# Patient Record
Sex: Female | Born: 1955 | Race: White | Hispanic: No | Marital: Married | Smoking: Former smoker
Health system: Southern US, Community
[De-identification: ages and names within clinical notes are randomized; demographics above are authoritative.]

## PROBLEM LIST (undated history)

## (undated) DIAGNOSIS — R112 Nausea with vomiting, unspecified: Secondary | ICD-10-CM

## (undated) DIAGNOSIS — E119 Type 2 diabetes mellitus without complications: Secondary | ICD-10-CM

## (undated) DIAGNOSIS — J309 Allergic rhinitis, unspecified: Secondary | ICD-10-CM

## (undated) DIAGNOSIS — N950 Postmenopausal bleeding: Secondary | ICD-10-CM

## (undated) DIAGNOSIS — Z9889 Other specified postprocedural states: Secondary | ICD-10-CM

## (undated) DIAGNOSIS — C801 Malignant (primary) neoplasm, unspecified: Secondary | ICD-10-CM

## (undated) DIAGNOSIS — G4730 Sleep apnea, unspecified: Secondary | ICD-10-CM

## (undated) DIAGNOSIS — M1990 Unspecified osteoarthritis, unspecified site: Secondary | ICD-10-CM

## (undated) DIAGNOSIS — J4520 Mild intermittent asthma, uncomplicated: Secondary | ICD-10-CM

## (undated) DIAGNOSIS — I10 Essential (primary) hypertension: Secondary | ICD-10-CM

## (undated) DIAGNOSIS — I8390 Asymptomatic varicose veins of unspecified lower extremity: Secondary | ICD-10-CM

## (undated) DIAGNOSIS — G245 Blepharospasm: Secondary | ICD-10-CM

## (undated) DIAGNOSIS — N879 Dysplasia of cervix uteri, unspecified: Secondary | ICD-10-CM

## (undated) DIAGNOSIS — M25559 Pain in unspecified hip: Secondary | ICD-10-CM

## (undated) DIAGNOSIS — Z85820 Personal history of malignant melanoma of skin: Secondary | ICD-10-CM

## (undated) HISTORY — PX: KNEE ARTHROSCOPY: SUR90

## (undated) HISTORY — PX: JOINT REPLACEMENT: SHX530

## (undated) HISTORY — PX: DILATION AND CURETTAGE OF UTERUS: SHX78

## (undated) HISTORY — PX: SHOULDER ARTHROSCOPY W/ ROTATOR CUFF REPAIR: SHX2400

## (undated) HISTORY — PX: HAMMER TOE SURGERY: SHX385

## (undated) HISTORY — PX: ABDOMINAL HYSTERECTOMY: SHX81

## (undated) HISTORY — PX: COLONOSCOPY: SHX174

## (undated) HISTORY — PX: OTHER SURGICAL HISTORY: SHX169

## (undated) HISTORY — PX: TONSILLECTOMY: SUR1361

## (undated) HISTORY — DX: Blepharospasm: G24.5

---

## 1998-11-05 ENCOUNTER — Other Ambulatory Visit: Admission: RE | Admit: 1998-11-05 | Discharge: 1998-11-05 | Payer: Self-pay | Admitting: Obstetrics and Gynecology

## 1999-10-23 ENCOUNTER — Other Ambulatory Visit: Admission: RE | Admit: 1999-10-23 | Discharge: 1999-10-23 | Payer: Self-pay | Admitting: Obstetrics and Gynecology

## 1999-12-31 ENCOUNTER — Encounter: Admission: RE | Admit: 1999-12-31 | Discharge: 1999-12-31 | Payer: Self-pay | Admitting: Obstetrics and Gynecology

## 1999-12-31 ENCOUNTER — Encounter: Payer: Self-pay | Admitting: Obstetrics and Gynecology

## 2000-04-16 ENCOUNTER — Encounter: Payer: Self-pay | Admitting: Orthopedic Surgery

## 2000-04-16 ENCOUNTER — Ambulatory Visit (HOSPITAL_COMMUNITY): Admission: RE | Admit: 2000-04-16 | Discharge: 2000-04-16 | Payer: Self-pay | Admitting: Orthopedic Surgery

## 2000-07-31 ENCOUNTER — Encounter: Payer: Self-pay | Admitting: Internal Medicine

## 2000-07-31 ENCOUNTER — Ambulatory Visit (HOSPITAL_COMMUNITY): Admission: RE | Admit: 2000-07-31 | Discharge: 2000-07-31 | Payer: Self-pay | Admitting: Internal Medicine

## 2000-11-04 ENCOUNTER — Other Ambulatory Visit: Admission: RE | Admit: 2000-11-04 | Discharge: 2000-11-04 | Payer: Self-pay | Admitting: Obstetrics and Gynecology

## 2001-01-06 ENCOUNTER — Encounter: Payer: Self-pay | Admitting: Obstetrics and Gynecology

## 2001-01-06 ENCOUNTER — Encounter: Admission: RE | Admit: 2001-01-06 | Discharge: 2001-01-06 | Payer: Self-pay | Admitting: Obstetrics and Gynecology

## 2001-11-05 ENCOUNTER — Other Ambulatory Visit: Admission: RE | Admit: 2001-11-05 | Discharge: 2001-11-05 | Payer: Self-pay | Admitting: Obstetrics and Gynecology

## 2002-01-10 ENCOUNTER — Encounter: Payer: Self-pay | Admitting: Obstetrics and Gynecology

## 2002-01-10 ENCOUNTER — Encounter: Admission: RE | Admit: 2002-01-10 | Discharge: 2002-01-10 | Payer: Self-pay | Admitting: Obstetrics and Gynecology

## 2002-11-07 ENCOUNTER — Other Ambulatory Visit: Admission: RE | Admit: 2002-11-07 | Discharge: 2002-11-07 | Payer: Self-pay | Admitting: Obstetrics and Gynecology

## 2003-01-09 ENCOUNTER — Encounter: Payer: Self-pay | Admitting: Obstetrics and Gynecology

## 2003-01-09 ENCOUNTER — Encounter: Admission: RE | Admit: 2003-01-09 | Discharge: 2003-01-09 | Payer: Self-pay | Admitting: Obstetrics and Gynecology

## 2003-11-09 ENCOUNTER — Other Ambulatory Visit: Admission: RE | Admit: 2003-11-09 | Discharge: 2003-11-09 | Payer: Self-pay | Admitting: Obstetrics and Gynecology

## 2004-01-15 ENCOUNTER — Encounter: Admission: RE | Admit: 2004-01-15 | Discharge: 2004-01-15 | Payer: Self-pay | Admitting: Obstetrics and Gynecology

## 2004-08-28 ENCOUNTER — Encounter: Admission: RE | Admit: 2004-08-28 | Discharge: 2004-08-28 | Payer: Self-pay | Admitting: Pediatrics

## 2004-09-27 ENCOUNTER — Ambulatory Visit: Payer: Self-pay | Admitting: Internal Medicine

## 2004-10-14 ENCOUNTER — Ambulatory Visit: Payer: Self-pay | Admitting: Internal Medicine

## 2004-10-14 HISTORY — PX: ESOPHAGOGASTRODUODENOSCOPY: SHX1529

## 2004-11-11 ENCOUNTER — Other Ambulatory Visit: Admission: RE | Admit: 2004-11-11 | Discharge: 2004-11-11 | Payer: Self-pay | Admitting: Obstetrics and Gynecology

## 2004-12-11 ENCOUNTER — Ambulatory Visit: Payer: Self-pay | Admitting: Internal Medicine

## 2005-01-03 ENCOUNTER — Ambulatory Visit: Payer: Self-pay | Admitting: Internal Medicine

## 2005-01-13 ENCOUNTER — Encounter: Admission: RE | Admit: 2005-01-13 | Discharge: 2005-01-13 | Payer: Self-pay | Admitting: Obstetrics and Gynecology

## 2005-01-13 ENCOUNTER — Ambulatory Visit: Payer: Self-pay | Admitting: Internal Medicine

## 2005-01-21 ENCOUNTER — Ambulatory Visit: Payer: Self-pay | Admitting: Internal Medicine

## 2005-02-19 ENCOUNTER — Ambulatory Visit: Payer: Self-pay | Admitting: Internal Medicine

## 2005-08-01 ENCOUNTER — Ambulatory Visit (HOSPITAL_COMMUNITY): Admission: RE | Admit: 2005-08-01 | Discharge: 2005-08-02 | Payer: Self-pay | Admitting: Specialist

## 2005-11-11 ENCOUNTER — Other Ambulatory Visit: Admission: RE | Admit: 2005-11-11 | Discharge: 2005-11-11 | Payer: Self-pay | Admitting: Obstetrics and Gynecology

## 2006-01-05 ENCOUNTER — Ambulatory Visit: Payer: Self-pay | Admitting: Internal Medicine

## 2006-01-12 ENCOUNTER — Encounter: Admission: RE | Admit: 2006-01-12 | Discharge: 2006-01-12 | Payer: Self-pay | Admitting: Internal Medicine

## 2006-01-12 ENCOUNTER — Ambulatory Visit: Payer: Self-pay | Admitting: Internal Medicine

## 2006-01-26 ENCOUNTER — Ambulatory Visit: Payer: Self-pay | Admitting: Pulmonary Disease

## 2007-01-14 ENCOUNTER — Ambulatory Visit: Payer: Self-pay | Admitting: Internal Medicine

## 2007-01-14 ENCOUNTER — Encounter: Admission: RE | Admit: 2007-01-14 | Discharge: 2007-01-14 | Payer: Self-pay | Admitting: Obstetrics and Gynecology

## 2007-01-14 LAB — CONVERTED CEMR LAB
ALT: 20 units/L (ref 0–40)
AST: 16 units/L (ref 0–37)
Albumin: 3.8 g/dL (ref 3.5–5.2)
Alkaline Phosphatase: 58 units/L (ref 39–117)
Basophils Absolute: 0 10*3/uL (ref 0.0–0.1)
Basophils Relative: 0.6 % (ref 0.0–1.0)
Calcium: 9.6 mg/dL (ref 8.4–10.5)
Chloride: 107 meq/L (ref 96–112)
Eosinophils Relative: 1.7 % (ref 0.0–5.0)
GFR calc non Af Amer: 81 mL/min
Glucose, Bld: 107 mg/dL — ABNORMAL HIGH (ref 70–99)
HCT: 40.7 % (ref 36.0–46.0)
LDL Cholesterol: 70 mg/dL (ref 0–99)
Neutrophils Relative %: 62.1 % (ref 43.0–77.0)
RBC: 4.59 M/uL (ref 3.87–5.11)
RDW: 12.6 % (ref 11.5–14.6)
Total CHOL/HDL Ratio: 3.1
Total Protein, Urine: NEGATIVE mg/dL
VLDL: 16 mg/dL (ref 0–40)
WBC: 6.8 10*3/uL (ref 4.5–10.5)
pH: 5 (ref 5.0–8.0)

## 2007-01-21 ENCOUNTER — Ambulatory Visit: Payer: Self-pay | Admitting: Internal Medicine

## 2007-09-07 DIAGNOSIS — J45909 Unspecified asthma, uncomplicated: Secondary | ICD-10-CM | POA: Insufficient documentation

## 2007-09-07 DIAGNOSIS — I10 Essential (primary) hypertension: Secondary | ICD-10-CM | POA: Insufficient documentation

## 2007-09-07 DIAGNOSIS — M1990 Unspecified osteoarthritis, unspecified site: Secondary | ICD-10-CM

## 2007-09-07 DIAGNOSIS — G4733 Obstructive sleep apnea (adult) (pediatric): Secondary | ICD-10-CM | POA: Insufficient documentation

## 2007-09-24 ENCOUNTER — Ambulatory Visit: Payer: Self-pay | Admitting: Internal Medicine

## 2008-01-04 ENCOUNTER — Ambulatory Visit: Payer: Self-pay | Admitting: Internal Medicine

## 2008-01-04 LAB — CONVERTED CEMR LAB
ALT: 23 units/L (ref 0–35)
Albumin: 3.8 g/dL (ref 3.5–5.2)
Alkaline Phosphatase: 63 units/L (ref 39–117)
BUN: 17 mg/dL (ref 6–23)
Basophils Absolute: 0 10*3/uL (ref 0.0–0.1)
Basophils Relative: 0.5 % (ref 0.0–1.0)
Bilirubin Urine: NEGATIVE
CO2: 26 meq/L (ref 19–32)
Calcium: 9.7 mg/dL (ref 8.4–10.5)
Creatinine, Ser: 0.7 mg/dL (ref 0.4–1.2)
GFR calc Af Amer: 114 mL/min
LDL Cholesterol: 87 mg/dL (ref 0–99)
MCHC: 32.4 g/dL (ref 30.0–36.0)
Monocytes Relative: 6.8 % (ref 3.0–11.0)
Neutro Abs: 4.6 10*3/uL (ref 1.4–7.7)
Platelets: 232 10*3/uL (ref 150–400)
RDW: 12.3 % (ref 11.5–14.6)
Specific Gravity, Urine: 1.025 (ref 1.000–1.03)
Total CHOL/HDL Ratio: 3.7
Total Protein, Urine: NEGATIVE mg/dL
Total Protein: 6.7 g/dL (ref 6.0–8.3)
Triglycerides: 50 mg/dL (ref 0–149)
Urine Glucose: NEGATIVE mg/dL
VLDL: 10 mg/dL (ref 0–40)
pH: 5.5 (ref 5.0–8.0)

## 2008-01-10 ENCOUNTER — Encounter: Admission: RE | Admit: 2008-01-10 | Discharge: 2008-01-10 | Payer: Self-pay | Admitting: Obstetrics and Gynecology

## 2008-01-11 ENCOUNTER — Ambulatory Visit: Payer: Self-pay | Admitting: Internal Medicine

## 2008-01-11 DIAGNOSIS — J309 Allergic rhinitis, unspecified: Secondary | ICD-10-CM | POA: Insufficient documentation

## 2008-01-11 DIAGNOSIS — E739 Lactose intolerance, unspecified: Secondary | ICD-10-CM

## 2008-04-21 ENCOUNTER — Telehealth: Payer: Self-pay | Admitting: Internal Medicine

## 2008-05-01 ENCOUNTER — Encounter: Payer: Self-pay | Admitting: Internal Medicine

## 2008-05-02 ENCOUNTER — Telehealth: Payer: Self-pay | Admitting: Internal Medicine

## 2008-09-18 ENCOUNTER — Telehealth: Payer: Self-pay | Admitting: Internal Medicine

## 2008-09-22 ENCOUNTER — Ambulatory Visit: Payer: Self-pay | Admitting: Internal Medicine

## 2008-09-28 ENCOUNTER — Ambulatory Visit: Payer: Self-pay | Admitting: Internal Medicine

## 2008-10-05 ENCOUNTER — Ambulatory Visit: Payer: Self-pay | Admitting: Internal Medicine

## 2008-10-26 ENCOUNTER — Encounter: Payer: Self-pay | Admitting: Internal Medicine

## 2008-10-27 ENCOUNTER — Telehealth (INDEPENDENT_AMBULATORY_CARE_PROVIDER_SITE_OTHER): Payer: Self-pay | Admitting: *Deleted

## 2008-10-30 ENCOUNTER — Ambulatory Visit: Payer: Self-pay | Admitting: Internal Medicine

## 2008-12-16 ENCOUNTER — Encounter: Admission: RE | Admit: 2008-12-16 | Discharge: 2008-12-16 | Payer: Self-pay | Admitting: Specialist

## 2008-12-21 ENCOUNTER — Encounter: Payer: Self-pay | Admitting: Internal Medicine

## 2008-12-27 ENCOUNTER — Ambulatory Visit: Payer: Self-pay | Admitting: Internal Medicine

## 2008-12-27 LAB — CONVERTED CEMR LAB
ALT: 17 units/L (ref 0–35)
AST: 17 units/L (ref 0–37)
Basophils Absolute: 0 10*3/uL (ref 0.0–0.1)
Bilirubin Urine: NEGATIVE
Bilirubin, Direct: 0.2 mg/dL (ref 0.0–0.3)
CO2: 29 meq/L (ref 19–32)
Chloride: 108 meq/L (ref 96–112)
Creatinine, Ser: 0.7 mg/dL (ref 0.4–1.2)
Eosinophils Absolute: 0.1 10*3/uL (ref 0.0–0.7)
GFR calc non Af Amer: 94 mL/min
Hemoglobin, Urine: NEGATIVE
Ketones, ur: NEGATIVE mg/dL
LDL Cholesterol: 74 mg/dL (ref 0–99)
Leukocytes, UA: NEGATIVE
Lymphocytes Relative: 24.6 % (ref 12.0–46.0)
MCHC: 34.3 g/dL (ref 30.0–36.0)
MCV: 90.8 fL (ref 78.0–100.0)
Neutrophils Relative %: 67.1 % (ref 43.0–77.0)
Platelets: 165 10*3/uL (ref 150–400)
RBC: 4.2 M/uL (ref 3.87–5.11)
RDW: 12.7 % (ref 11.5–14.6)
Sodium: 141 meq/L (ref 135–145)
Specific Gravity, Urine: 1.015 (ref 1.000–1.03)
TSH: 0.79 microintl units/mL (ref 0.35–5.50)
Total Bilirubin: 0.7 mg/dL (ref 0.3–1.2)
Total CHOL/HDL Ratio: 3.1
Triglycerides: 68 mg/dL (ref 0–149)
Urine Glucose: NEGATIVE mg/dL
Urobilinogen, UA: 0.2 (ref 0.0–1.0)
VLDL: 14 mg/dL (ref 0–40)

## 2008-12-28 ENCOUNTER — Ambulatory Visit: Payer: Self-pay | Admitting: Internal Medicine

## 2009-01-04 ENCOUNTER — Ambulatory Visit (HOSPITAL_COMMUNITY): Admission: RE | Admit: 2009-01-04 | Discharge: 2009-01-05 | Payer: Self-pay | Admitting: Specialist

## 2009-01-04 HISTORY — PX: OTHER SURGICAL HISTORY: SHX169

## 2009-02-22 ENCOUNTER — Encounter: Admission: RE | Admit: 2009-02-22 | Discharge: 2009-02-22 | Payer: Self-pay | Admitting: Obstetrics and Gynecology

## 2009-03-19 ENCOUNTER — Ambulatory Visit: Payer: Self-pay | Admitting: Internal Medicine

## 2009-03-19 DIAGNOSIS — J209 Acute bronchitis, unspecified: Secondary | ICD-10-CM

## 2009-10-17 ENCOUNTER — Telehealth: Payer: Self-pay | Admitting: Internal Medicine

## 2010-01-09 ENCOUNTER — Ambulatory Visit: Payer: Self-pay | Admitting: Internal Medicine

## 2010-01-09 LAB — CONVERTED CEMR LAB
AST: 19 units/L (ref 0–37)
Alkaline Phosphatase: 59 units/L (ref 39–117)
Basophils Absolute: 0 10*3/uL (ref 0.0–0.1)
Bilirubin, Direct: 0.2 mg/dL (ref 0.0–0.3)
CO2: 31 meq/L (ref 19–32)
Calcium: 9.9 mg/dL (ref 8.4–10.5)
Creatinine, Ser: 0.8 mg/dL (ref 0.4–1.2)
Eosinophils Absolute: 0.1 10*3/uL (ref 0.0–0.7)
GFR calc non Af Amer: 80.09 mL/min (ref >60)
Glucose, Bld: 130 mg/dL — ABNORMAL HIGH (ref 70–99)
HDL: 45.8 mg/dL (ref >39.00)
Lymphocytes Relative: 23.8 % (ref 12.0–46.0)
MCHC: 33.1 g/dL (ref 30.0–36.0)
Monocytes Relative: 6.7 % (ref 3.0–12.0)
Neutrophils Relative %: 67.4 % (ref 43.0–77.0)
RDW: 12.7 % (ref 11.5–14.6)
Sodium: 142 meq/L (ref 135–145)
Specific Gravity, Urine: 1.015 (ref 1.000–1.030)
Total Bilirubin: 0.7 mg/dL (ref 0.3–1.2)
Total CHOL/HDL Ratio: 3
Total Protein, Urine: NEGATIVE mg/dL
Triglycerides: 71 mg/dL (ref 0.0–149.0)
Urine Glucose: NEGATIVE mg/dL
Urobilinogen, UA: 0.2 (ref 0.0–1.0)
VLDL: 14.2 mg/dL (ref 0.0–40.0)
pH: 7 (ref 5.0–8.0)

## 2010-01-10 LAB — CONVERTED CEMR LAB: Hgb A1c MFr Bld: 6.4 % (ref 4.6–6.5)

## 2010-01-14 ENCOUNTER — Encounter: Admission: RE | Admit: 2010-01-14 | Discharge: 2010-01-14 | Payer: Self-pay | Admitting: Internal Medicine

## 2010-01-16 ENCOUNTER — Ambulatory Visit: Payer: Self-pay | Admitting: Internal Medicine

## 2010-01-24 ENCOUNTER — Ambulatory Visit: Payer: Self-pay | Admitting: Pulmonary Disease

## 2010-05-06 ENCOUNTER — Encounter: Payer: Self-pay | Admitting: Pulmonary Disease

## 2010-05-14 ENCOUNTER — Telehealth (INDEPENDENT_AMBULATORY_CARE_PROVIDER_SITE_OTHER): Payer: Self-pay | Admitting: *Deleted

## 2010-08-26 ENCOUNTER — Encounter: Payer: Self-pay | Admitting: Internal Medicine

## 2010-10-09 ENCOUNTER — Telehealth: Payer: Self-pay | Admitting: Internal Medicine

## 2010-10-09 DIAGNOSIS — G245 Blepharospasm: Secondary | ICD-10-CM | POA: Insufficient documentation

## 2010-10-09 DIAGNOSIS — M62830 Muscle spasm of back: Secondary | ICD-10-CM | POA: Insufficient documentation

## 2010-10-09 HISTORY — DX: Blepharospasm: G24.5

## 2010-12-13 ENCOUNTER — Telehealth: Payer: Self-pay | Admitting: Internal Medicine

## 2010-12-14 ENCOUNTER — Other Ambulatory Visit: Payer: Self-pay

## 2010-12-14 DIAGNOSIS — Z1239 Encounter for other screening for malignant neoplasm of breast: Secondary | ICD-10-CM

## 2010-12-22 LAB — CONVERTED CEMR LAB: Pap Smear: NORMAL

## 2010-12-24 NOTE — Progress Notes (Signed)
Summary: sleep study results  Phone Note Call from Patient Call back at [phone]   Caller: Patient Call For: alva Reason for Call: Lab or Test Results Summary of Call: Wants results of sleep study. Initial call taken by: Darletta Moll,  May 14, 2010 8:18 AM  Follow-up for Phone Call        there is a download in pt's chart from 05-06-10 but has no interpretation.  please advise, thanks! Boone Master CNA/MA  May 14, 2010 9:42 AM   reviewed download 5/11-6/13 - avg pr 11, good usage, no residual events, change to fixed pr 11 cm Follow-up by: Comer Locket. Vassie Loll MD,  May 14, 2010 10:25 AM  Additional Follow-up for Phone Call Additional follow up Details #1::        pt aware of msg from RA--and per libby order has [facility] Additional Follow-up by: Philipp Deputy CMA,  May 14, 2010 10:43 AM

## 2010-12-24 NOTE — Assessment & Plan Note (Signed)
Summary: sleep apnea/apc   Visit Type:  Initial Consult Copy to:  pcp Primary Provider/Referring Provider:  Corwin Levins MD  CC:  Pt here for sleep consult.  History of Present Illness: 53/F ex smoker for evaluation of obstructive sleep apnea . She moved from [place] to [place] in2000. Due to excessive daytime somnolence & loud snoring she had a sleep study done in [place] in 1997/98 @ [facility] (not available) , was told she had severe obstructive sleep apnea & set up on nasal CPAP ? 5 cm. She has gained 100 lbs since then, claims good compliance & report s a mask leak- supplier is [facility].  Bedtime is 8-9 pm, latency 20 mins, several awakenings, sleeps on 2 pillows on her left side, wakes up with her husband at 4.40 am without headaches or a dry mouth, denies napping but c/o fatigue. Epworth Sleepiness Score is 5/24. Lately, snoring hon cpap has increased to the point of husband having to change bedrooms. She does not want to go back to th elab for a study. There is no history suggestive of cataplexy, sleep paralysis or parasomnias  Asthma was diagnosed as a child , she grew out of this as an adult & is now back to using pro-air , 2 cannisters/ year. This may neeed an evaluation in the future.   History of Present Illness: fighting with CPAP. Husband sleep in another room since snoring has gradually increased  What time do you typically go to bed?(between what hours): 8 to 9  How long does it take you to fall asleep? ?20 min  How many times during the night do you wake up? several  What time do you get out of bed to start your day? 4:40  Do you drive or operate heavy machinery in your occupation? no  How much has your weight changed (up or down) over the past two years? (in pounds): +6  Have you ever had a sleep study before?  If yes,when and where: yes- 97 or 98 @ [facility], [place]  Do you currently use CPAP ? If so , at what pressure? 5  Do you  wear oxygen at any time? If yes, how many liters per minute? no Current Medications (verified): 1)  Amlodipine Besylate 10 Mg Tabs (Amlodipine Besylate) .Marland Kitchen.. 1po Once Daily 2)  Celebrex 200 Mg Caps (Celecoxib) .Marland Kitchen.. 1 By Mouth Two Times A Day As Needed 3)  Diovan 160 Mg Tabs (Valsartan) .... Take 1 Tablet By Mouth Once A Day 4)  Nexium 40 Mg Cpdr (Esomeprazole Magnesium) .... Take 1 Capsule By Mouth Once A Day As Needed 5)  Singulair 10 Mg Tabs (Montelukast Sodium) .Marland Kitchen.. 1po Once Daily 6)  Ultram Er 100 Mg Tb24 (Tramadol Hcl) .... Take 1 Tablet By Mouth Once A Day As Needed Pain 7)  Cetirizine Hcl 10 Mg  Tabs (Cetirizine Hcl) .... Take 1 Tablet By Mouth Once A Day 8)  Errin 0.35 Mg  Tabs (Norethindrone (Contraceptive)) .... Use Asd 1 Po Qd 9)  Adult Aspirin Ec Low Strength 81 Mg Tbec (Aspirin) .Marland Kitchen.. 1po Once Daily 10)  Glucosamine 1500 Complex  Caps (Glucosamine-Chondroit-Vit C-Mn) .Marland Kitchen.. 1 By Mouth Once Daily 11)  Calcium Supplement .Marland Kitchen.. 1 By Mouth Once Daily 12)  Proair Hfa 108 (90 Base) Mcg/act Aers (Albuterol Sulfate) .... 2 Puffs Four Times Per Day As Needed 13)  Cpap .... Dme-[facility]  Allergies (verified): 1)  ! Lotrel (Amlodipine Besy-Benazepril Hcl) 2)  Penicillin 3)  Nucofed  Past History:  Past Medical History: Last updated: 01/11/2008 Asthma Hypertension Osteoarthritis - knee OSA Allergic rhinitis glucose intolerance  Past Surgical History: Last updated: 09/07/2007 Knee sx-bilateral Tailor bunion removal Hammertoe Shoulder recontruction EGD-10/14/2004  Family History: Last updated: 01/24/2010 Family History Diabetes Family History Ovarian cancer father with esophageal cancer sister with melanoma Family History Asthma-siblings  Social History: Last updated: 01/24/2010 Former Smoker - quit early 90's Married, 1 dog & cat Drug use-no Alcohol use-yes no children  Risk Factors: Smoking Status: quit (09/07/2007)  Family History: Family History  Diabetes Family History Ovarian cancer father with esophageal cancer sister with melanoma Family History Asthma-siblings  Social History: Former Smoker - quit early 90's Married, 1 dog & cat Drug use-no Alcohol use-yes no children  Review of Systems       The patient complains of shortness of breath with activity, non-productive cough, sore throat, nasal congestion/difficulty breathing through nose, sneezing, and joint stiffness or pain.  The patient denies shortness of breath at rest, productive cough, coughing up blood, chest pain, irregular heartbeats, acid heartburn, indigestion, loss of appetite, weight change, abdominal pain, difficulty swallowing, tooth/dental problems, headaches, itching, ear ache, anxiety, depression, hand/feet swelling, rash, change in color of mucus, and fever.    Vital Signs:  Patient profile:   55 year old female Height:      66 inches Weight:      263.38 pounds O2 Sat:      96 % on Room air Temp:     97.9 degrees F oral Pulse rate:   96 / minute BP sitting:   108 / 80  (left arm) Cuff size:   large  Vitals Entered By: Zackery Barefoot CMA (January 24, 2010 3:09 PM)  O2 Flow:  Room air CC: Pt here for sleep consult Comments Medications reviewed with patient Verified contact number and pharmacy with patient Zackery Barefoot CMA  January 24, 2010 3:10 PM    Physical Exam  Additional Exam:  Gen. Pleasant, well-nourished, in no distress, normal affect ENT - no lesions, no post nasal drip Neck: No JVD, no thyromegaly, no carotid bruits Lungs: no use of accessory muscles, no dullness to percussion, clear without rales or rhonchi  Cardiovascular: Rhythm regular, heart sounds  normal, no murmurs or gallops, no peripheral edema Abdomen: soft and non-tender, no hepatosplenomegaly, BS normal. Musculoskeletal: No deformities, no cyanosis or clubbing Neuro:  alert, non focal     Impression & Recommendations:  Problem # 1:  OBSTRUCTIVE SLEEP APNEA  (ICD-327.23) The pathophysiology of obstructive sleep apnea, it's cardiovascular consequences and modes of treatment including CPAP were discussed with the patient in great detail.  Will set up autoCPAP 5-15 , nasal , review downlaod in 4 wks & suggest changes Compliance encouraged, wt loss emphasized, asked to avoid meds with sedative side effects, cautioned against driving when sleepy.  Orders: Consultation Level III ([address]) DME Referral (DME)  Problem # 2:  ASTHMA (ICD-493.90) ?mild intermittent  ct pro-air for now.  Medications Added to Medication List This Visit: 1)  Nexium 40 Mg Cpdr (Esomeprazole magnesium) .... Take 1 capsule by mouth once a day as needed 2)  Cetirizine Hcl 10 Mg Tabs (Cetirizine hcl) .... Take 1 tablet by mouth once a day 3)  Cpap  .... [facility]  Patient Instructions: 1)  Copy sent to: Dr Jonny Ruiz 2)  Please schedule a follow-up appointment in 1 month after download

## 2010-12-24 NOTE — Progress Notes (Signed)
Summary: Referral  Phone Note Call from Patient   Caller: Patient [phone] Summary of Call: Pt called stating she has been having RT eye twitching for almost 1 year, pt is requesting referral to specialist. Initial call taken by: Margaret Pyle, CMA,  October 09, 2010 9:39 AM  Follow-up for Phone Call        ? 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

## 2010-12-24 NOTE — Assessment & Plan Note (Signed)
Summary: cpx / bcbs / #/ cd   Vital Signs:  Patient profile:   55 year old female Height:      66 inches Weight:      260.50 pounds BMI:     42.20 O2 Sat:      97 % on Room air Temp:     97.9 degrees F oral Pulse rate:   101 / minute BP sitting:   132 / 82  (left arm) Cuff size:   large  Vitals Entered ByZella Ball Ewing (January 16, 2010 8:46 AM)  O2 Flow:  Room air  Preventive Care Screening  Mammogram:    Date:  01/14/2010    Results:  normal   Pap Smear:    Date:  01/14/2010    Results:  normal   Last Flu Shot:    Date:  07/25/2009    Results:  given   CC: Adult Physical/RE   CC:  Adult Physical/RE.  History of Present Illness: gained 6 lbs since lat yr;  last sleep study done approx 1997 but she has been using same setting  since then, has more snoring latley;  has new mask, but in d/w resp therapist needs autopap with parameters and duration (2 wks, 5-20 pressure);  Pt denies CP, sob, doe, wheezing, orthopnea, pnd, worsening LE edema, palps, dizziness or syncope   Pt denies new neuro symptoms such as headache, facial or extremity weakness   Problems Prior to Update: 1)  Asthmatic Bronchitis, Acute  (ICD-466.0) 2)  Preoperative Examination  (ICD-V72.84) 3)  Preventive Health Care  (ICD-V70.0) 4)  Glucose Intolerance  (ICD-271.3) 5)  Preventive Health Care  (ICD-V70.0) 6)  Allergic Rhinitis  (ICD-477.9) 7)  Obstructive Sleep Apnea  (ICD-327.23) 8)  Routine General Medical Exam@health  Care [facility]  (ICD-V70.0) 9)  Osteoarthritis  (ICD-715.90) 10)  Family History Diabetes 1st Degree Relative  (ICD-V18.0) 11)  Obstructive Sleep Apnea  (ICD-327.23) 12)  Hypertension  (ICD-401.9) 13)  Asthma  (ICD-493.90)  Medications Prior to Update: 1)  Amlodipine Besylate 10 Mg Tabs (Amlodipine Besylate) .Marland Kitchen.. 1po Once Daily 2)  Celebrex 200 Mg Caps (Celecoxib) .Marland Kitchen.. 1 By Mouth Two Times A Day 3)  Diovan 160 Mg Tabs (Valsartan) .... Take 1 Tablet By Mouth Once A Day 4)  Nexium  40 Mg Cpdr (Esomeprazole Magnesium) .... Take 1 Capsule By Mouth Once A Day 5)  Singulair 10 Mg Tabs (Montelukast Sodium) .Marland Kitchen.. 1po Once Daily 6)  Ultram Er 100 Mg Tb24 (Tramadol Hcl) .... Take 1 Tablet By Mouth Once A Day As Needed Pain 7)  Cetirizine Hcl 10 Mg  Tabs (Cetirizine Hcl) .Marland Kitchen.. 1po Qd 8)  Errin 0.35 Mg  Tabs (Norethindrone (Contraceptive)) .... Use Asd 1 Po Qd 9)  Adult Aspirin Ec Low Strength 81 Mg Tbec (Aspirin) .Marland Kitchen.. 1po Once Daily 10)  Glucosamine 1500 Complex  Caps (Glucosamine-Chondroit-Vit C-Mn) .Marland Kitchen.. 1 By Mouth Once Daily 11)  Calcium Supplement .Marland Kitchen.. 1 By Mouth Once Daily 12)  Primatene Mist 0.22 Mg/act Aers (Epinephrine Base) .Marland Kitchen.. 1 Puff Every 3 Hours As Needed 13)  Asmanex 14 Metered Doses 220 Mcg/inh Aepb (Mometasone Furoate) .... Use Asd Two Times A Day 14)  Proair Hfa 108 (90 Base) Mcg/act Aers (Albuterol Sulfate) .... 2 Puffs Four Times Per Day As Needed  Current Medications (verified): 1)  Amlodipine Besylate 10 Mg Tabs (Amlodipine Besylate) .Marland Kitchen.. 1po Once Daily 2)  Celebrex 200 Mg Caps (Celecoxib) .Marland Kitchen.. 1 By Mouth Two Times A Day As Needed 3)  Diovan 160  Mg Tabs (Valsartan) .... Take 1 Tablet By Mouth Once A Day 4)  Nexium 40 Mg Cpdr (Esomeprazole Magnesium) .... Take 1 Capsule By Mouth Once A Day 5)  Singulair 10 Mg Tabs (Montelukast Sodium) .Marland Kitchen.. 1po Once Daily 6)  Ultram Er 100 Mg Tb24 (Tramadol Hcl) .... Take 1 Tablet By Mouth Once A Day As Needed Pain 7)  Cetirizine Hcl 10 Mg  Tabs (Cetirizine Hcl) .Marland Kitchen.. 1po Once Daily As Needed Allergies 8)  Errin 0.35 Mg  Tabs (Norethindrone (Contraceptive)) .... Use Asd 1 Po Qd 9)  Adult Aspirin Ec Low Strength 81 Mg Tbec (Aspirin) .Marland Kitchen.. 1po Once Daily 10)  Glucosamine 1500 Complex  Caps (Glucosamine-Chondroit-Vit C-Mn) .Marland Kitchen.. 1 By Mouth Once Daily 11)  Calcium Supplement .Marland Kitchen.. 1 By Mouth Once Daily 12)  Proair Hfa 108 (90 Base) Mcg/act Aers (Albuterol Sulfate) .... 2 Puffs Four Times Per Day As Needed  Allergies (verified): 1)  !  Lotrel (Amlodipine Besy-Benazepril Hcl) 2)  Penicillin 3)  Nucofed  Past History:  Past Medical History: Last updated: 01/11/2008 Asthma Hypertension Osteoarthritis - knee OSA Allergic rhinitis glucose intolerance  Past Surgical History: Last updated: 09/07/2007 Knee sx-bilateral Tailor bunion removal Hammertoe Shoulder recontruction EGD-10/14/2004  Family History: Last updated: 01/11/2008 Family History Diabetes Family History Ovarian cancer father with esophageal cancer sister with melanoma  Social History: Last updated: 01/11/2008 Former Smoker Married Drug use-no Alcohol use-yes no children  Risk Factors: Smoking Status: quit (09/07/2007)  Review of Systems  The patient denies anorexia, fever, weight loss, vision loss, decreased hearing, hoarseness, chest pain, syncope, dyspnea on exertion, peripheral edema, prolonged cough, headaches, hemoptysis, abdominal pain, melena, hematochezia, severe indigestion/heartburn, hematuria, incontinence, muscle weakness, suspicious skin lesions, difficulty walking, depression, unusual weight change, abnormal bleeding, enlarged lymph nodes, and angioedema.         all otherwise negative per pt  Physical Exam  General:  alert and overweight-appearing.   Head:  normocephalic and atraumatic.   Eyes:  vision grossly intact, pupils equal, and pupils round.   Ears:  R ear normal and L ear normal.   Nose:  no external deformity and no nasal discharge.   Mouth:  no gingival abnormalities and pharynx pink and moist.   Neck:  supple and no masses.   Lungs:  normal respiratory effort and normal breath sounds.   Heart:  normal rate and regular rhythm.   Abdomen:  soft, non-tender, and normal bowel sounds.   Msk:  no joint tenderness and no joint swelling.   Extremities:  no edema, no erythema  Neurologic:  cranial nerves II-XII intact and strength normal in all extremities.     Impression & Recommendations:  Problem # 1:   Preventive Health Care (ICD-V70.0) Overall doing well, age appropriate education and counseling updated and referral for appropriate preventive services done unless declined, immunizations up to date or declined, diet counseling done if overweight, urged to quit smoking if smokes , most recent labs reviewed and current ordered if appropriate, ecg reviewed or declined (interpretation per ECG scanned in the EMR if done); information regarding Medicare Prevention requirements given if appropriate   Problem # 2:  OBSTRUCTIVE SLEEP APNEA (ICD-327.23)  due for f/u and liekly needs autopap study - refer pulm  Orders: Pulmonary Referral (Pulmonary)  Problem # 3:  HYPERTENSION (ICD-401.9)  Her updated medication list for this problem includes:    Amlodipine Besylate 10 Mg Tabs (Amlodipine besylate) .Marland Kitchen... 1po once daily    Diovan 160 Mg Tabs (Valsartan) .Marland Kitchen... Take 1  tablet by mouth once a day  BP today: 132/82 Prior BP: 138/90 (03/19/2009)  Labs Reviewed: K+: 4.8 (01/09/2010) Creat: : 0.8 (01/09/2010)   Chol: 138 (01/09/2010)   HDL: 45.80 (01/09/2010)   LDL: 78 (01/09/2010)   TG: 71.0 (01/09/2010) stable overall by hx and exam, ok to continue meds/tx as is   Complete Medication List: 1)  Amlodipine Besylate 10 Mg Tabs (Amlodipine besylate) .Marland Kitchen.. 1po once daily 2)  Celebrex 200 Mg Caps (Celecoxib) .Marland Kitchen.. 1 by mouth two times a day as needed 3)  Diovan 160 Mg Tabs (Valsartan) .... Take 1 tablet by mouth once a day 4)  Nexium 40 Mg Cpdr (Esomeprazole magnesium) .... Take 1 capsule by mouth once a day 5)  Singulair 10 Mg Tabs (Montelukast sodium) .Marland Kitchen.. 1po once daily 6)  Ultram Er 100 Mg Tb24 (Tramadol hcl) .... Take 1 tablet by mouth once a day as needed pain 7)  Cetirizine Hcl 10 Mg Tabs (Cetirizine hcl) .Marland Kitchen.. 1po once daily as needed allergies 8)  Errin 0.35 Mg Tabs (Norethindrone (contraceptive)) .... Use asd 1 po qd 9)  Adult Aspirin Ec Low Strength 81 Mg Tbec (Aspirin) .Marland Kitchen.. 1po once daily 10)   Glucosamine 1500 Complex Caps (Glucosamine-chondroit-vit c-mn) .Marland Kitchen.. 1 by mouth once daily 11)  Calcium Supplement  .Marland KitchenMarland Kitchen. 1 by mouth once daily 12)  Proair Hfa 108 (90 Base) Mcg/act Aers (Albuterol sulfate) .... 2 puffs four times per day as needed  Patient Instructions: 1)  You will be contacted about the referral(s) to: pulmonary 2)  Continue all previous medications as before this visit  3)  all prescriptions were sent on the computer except for the ultram 4)  Please schedule a follow-up appointment in 1 year or sooner if needed Prescriptions: PROAIR HFA 108 (90 BASE) MCG/ACT AERS (ALBUTEROL SULFATE) 2 puffs four times per day as needed  #3 x 3   Entered and Authorized by:   Corwin Levins MD   Signed by:   Corwin Levins MD on 01/16/2010   Method used:   Electronically to        [facility] (retail)       [address]       [place], [place]  [address]       Ph: [phone]       Fax: [phone]   RxID:   [id number] CETIRIZINE HCL 10 MG  TABS (CETIRIZINE HCL) 1po once daily as needed allergies  #90 x 3   Entered and Authorized by:   Corwin Levins MD   Signed by:   Corwin Levins MD on 01/16/2010   Method used:   Electronically to        [facility] (retail)       [address]       [place], [place]  [address]       Ph: [phone]       Fax: [phone]   RxID:   [id number] PROAIR HFA 108 (90 BASE) MCG/ACT AERS (ALBUTEROL SULFATE) 2 puffs four times per day as needed  #3 x 3   Entered and Authorized by:   Corwin Levins MD   Signed by:   Corwin Levins MD on 01/16/2010   Method used:   Print then Give to Patient   RxID:   [id number] CETIRIZINE HCL 10 MG  TABS (CETIRIZINE HCL) 1po once daily as needed allergies  #90 x 3   Entered and Authorized by:  Corwin Levins MD   Signed by:   Corwin Levins MD on 01/16/2010   Method used:   Print then Give to Patient   RxID:   [id number] JYNWGN ER 100 MG TB24 (TRAMADOL HCL) Take 1 tablet by mouth once a  day as needed pain  #90 x 3   Entered and Authorized by:   Corwin Levins MD   Signed by:   Corwin Levins MD on 01/16/2010   Method used:   Print then Give to Patient   RxID:   [id number] SINGULAIR 10 MG TABS (MONTELUKAST SODIUM) 1po once daily  #90 x 3   Entered and Authorized by:   Corwin Levins MD   Signed by:   Corwin Levins MD on 01/16/2010   Method used:   Electronically to        [facility]       [address]       [place], [place]  [address]       Ph: [phone]       Fax: [phone]   RxID:   [id number] NEXIUM 40 MG CPDR (ESOMEPRAZOLE MAGNESIUM) Take 1 capsule by mouth once a day  #90 x 3   Entered and Authorized by:   Corwin Levins MD   Signed by:   Corwin Levins MD on 01/16/2010   Method used:   Electronically to        [facility] ([facility])       [address]       [place], [place]  [address]       Ph: [phone]       Fax: [phone]   RxID:   [id number] DIOVAN 160 MG TABS (VALSARTAN) Take 1 tablet by mouth once a day  #90 x 3   Entered and Authorized by:   Corwin Levins MD   Signed by:   Corwin Levins MD on 01/16/2010   Method used:   Electronically to        [facility] ([facility])       [address]       [place], [place]  [address]       Ph: [phone]       Fax: [phone]   RxID:   [id number] CELEBREX 200 MG CAPS (CELECOXIB) 1 by mouth two times a day as needed  #180 x 3   Entered and Authorized by:   Corwin Levins MD   Signed by:   Corwin Levins MD on 01/16/2010   Method used:   Electronically to        [facility] ([facility])       [address]       [place], [place]  [address]       Ph: [phone]       Fax: [phone]   RxID:   [id number] AMLODIPINE BESYLATE 10 MG TABS (AMLODIPINE BESYLATE) 1po once daily  #90 x 3   Entered and Authorized by:   Corwin Levins MD   Signed by:   Corwin Levins MD on 01/16/2010   Method used:   Electronically to        [facility] ([facility]ail)       [address]       [place], [place]  [address]       Ph: [phone]  Fax: [phone]   RxID:   [id number] AMLODIPINE BESYLATE 10 MG TABS (AMLODIPINE BESYLATE) 1po once daily  #90 x 3   Entered and Authorized by:   Corwin Levins MD   Signed by:   Corwin Levins MD on 01/16/2010   Method used:   Print then Give to Patient   RxID:   [id number]

## 2010-12-24 NOTE — Miscellaneous (Signed)
Summary: Immunization Entry   Immunization History:  Influenza Immunization History:    Influenza:  historical (08/24/2010)  [facility] [address] [place], [place] [address] Vaccine, Fluzone Dose, 0.5 ML Site, Right Deltoid, IMMfg. [employer] Date Administered, 08/24/2010 Lot Number [id number]

## 2010-12-26 NOTE — Progress Notes (Signed)
Summary: increase med  Phone Note Call from Patient Call back at Work Phone [phone] Call back at cell [phone]   Caller: Patient Summary of Call: Patient called [facility] stating that the next avail appt for cpx is not until 02/2011. She says that the current dose of tramadol is not helping and would like to know if MD will consider increasing until she can be seen.Marland KitchenMarland KitchenMarland KitchenAlvy Beal Archie CMA  December 13, 2010 9:53 AM   Follow-up for Phone Call        Pt advised via VM that medication increase cannot be done without OV. Pt advised to discuss effectiveness of Tramadol at OV 02/2011 with JWJ Follow-up by: Margaret Pyle, CMA,  December 13, 2010 11:19 AM

## 2011-01-13 ENCOUNTER — Other Ambulatory Visit: Payer: Self-pay | Admitting: Obstetrics and Gynecology

## 2011-01-13 ENCOUNTER — Ambulatory Visit
Admission: RE | Admit: 2011-01-13 | Discharge: 2011-01-13 | Disposition: A | Discharge status: Home / Self Care | Payer: BC Managed Care – PPO | Source: Ambulatory Visit

## 2011-01-13 DIAGNOSIS — Z1239 Encounter for other screening for malignant neoplasm of breast: Secondary | ICD-10-CM

## 2011-03-11 ENCOUNTER — Other Ambulatory Visit: Payer: Self-pay

## 2011-03-11 ENCOUNTER — Other Ambulatory Visit: Payer: Self-pay | Admitting: Internal Medicine

## 2011-03-11 DIAGNOSIS — Z0000 Encounter for general adult medical examination without abnormal findings: Secondary | ICD-10-CM

## 2011-03-12 ENCOUNTER — Other Ambulatory Visit (INDEPENDENT_AMBULATORY_CARE_PROVIDER_SITE_OTHER): Payer: BC Managed Care – PPO

## 2011-03-12 ENCOUNTER — Other Ambulatory Visit (INDEPENDENT_AMBULATORY_CARE_PROVIDER_SITE_OTHER): Payer: BC Managed Care – PPO | Admitting: Internal Medicine

## 2011-03-12 DIAGNOSIS — Z0000 Encounter for general adult medical examination without abnormal findings: Secondary | ICD-10-CM

## 2011-03-12 DIAGNOSIS — I10 Essential (primary) hypertension: Secondary | ICD-10-CM

## 2011-03-12 DIAGNOSIS — M1990 Unspecified osteoarthritis, unspecified site: Secondary | ICD-10-CM

## 2011-03-12 DIAGNOSIS — Z13220 Encounter for screening for lipoid disorders: Secondary | ICD-10-CM

## 2011-03-12 LAB — HEPATIC FUNCTION PANEL
Albumin: 3.8 g/dL (ref 3.5–5.2)
Bilirubin, Direct: 0.1 mg/dL (ref 0.0–0.3)
Total Protein: 6.6 g/dL (ref 6.0–8.3)

## 2011-03-12 LAB — CBC WITH DIFFERENTIAL/PLATELET
Basophils Relative: 0.4 % (ref 0.0–3.0)
Eosinophils Absolute: 0.1 10*3/uL (ref 0.0–0.7)
HCT: 40.1 % (ref 36.0–46.0)
Lymphs Abs: 1.6 10*3/uL (ref 0.7–4.0)
MCHC: 34 g/dL (ref 30.0–36.0)
MCV: 90.9 fl (ref 78.0–100.0)
Monocytes Absolute: 0.3 10*3/uL (ref 0.1–1.0)
Neutrophils Relative %: 65.9 % (ref 43.0–77.0)
RBC: 4.41 Mil/uL (ref 3.87–5.11)

## 2011-03-12 LAB — URINALYSIS, ROUTINE W REFLEX MICROSCOPIC
Ketones, ur: NEGATIVE
Specific Gravity, Urine: 1.01 (ref 1.000–1.030)
Total Protein, Urine: NEGATIVE
Urine Glucose: NEGATIVE
pH: 6.5 (ref 5.0–8.0)

## 2011-03-12 LAB — BASIC METABOLIC PANEL
BUN: 15 mg/dL (ref 6–23)
CO2: 29 mEq/L (ref 19–32)
Chloride: 109 mEq/L (ref 96–112)
Creatinine, Ser: 0.6 mg/dL (ref 0.4–1.2)
Potassium: 5.3 mEq/L — ABNORMAL HIGH (ref 3.5–5.1)

## 2011-03-12 LAB — LIPID PANEL
Cholesterol: 130 mg/dL (ref 0–200)
HDL: 40.1 mg/dL (ref >39.00)
Total CHOL/HDL Ratio: 3
Triglycerides: 48 mg/dL (ref 0.0–149.0)

## 2011-03-16 ENCOUNTER — Encounter: Payer: Self-pay | Admitting: Internal Medicine

## 2011-03-16 DIAGNOSIS — E1165 Type 2 diabetes mellitus with hyperglycemia: Secondary | ICD-10-CM | POA: Insufficient documentation

## 2011-03-16 DIAGNOSIS — E119 Type 2 diabetes mellitus without complications: Secondary | ICD-10-CM | POA: Insufficient documentation

## 2011-03-16 DIAGNOSIS — Z0001 Encounter for general adult medical examination with abnormal findings: Secondary | ICD-10-CM | POA: Insufficient documentation

## 2011-03-16 DIAGNOSIS — Z0000 Encounter for general adult medical examination without abnormal findings: Secondary | ICD-10-CM | POA: Insufficient documentation

## 2011-03-18 ENCOUNTER — Ambulatory Visit (INDEPENDENT_AMBULATORY_CARE_PROVIDER_SITE_OTHER): Payer: BC Managed Care – PPO | Admitting: Internal Medicine

## 2011-03-18 ENCOUNTER — Encounter: Payer: Self-pay | Admitting: Internal Medicine

## 2011-03-18 VITALS — BP 138/78 | HR 93 | Temp 98.4°F | Ht 67.0 in | Wt 260.2 lb

## 2011-03-18 DIAGNOSIS — R7309 Other abnormal glucose: Secondary | ICD-10-CM

## 2011-03-18 DIAGNOSIS — R7302 Impaired glucose tolerance (oral): Secondary | ICD-10-CM

## 2011-03-18 DIAGNOSIS — Z0000 Encounter for general adult medical examination without abnormal findings: Secondary | ICD-10-CM

## 2011-03-18 MED ORDER — TRAMADOL HCL ER 200 MG PO TB24: 200.0000 mg | ORAL_TABLET | Freq: Every day | ORAL | Status: AC

## 2011-03-18 NOTE — Assessment & Plan Note (Signed)

## 2011-03-18 NOTE — Progress Notes (Signed)
Subjective:    Patient ID: Tanya [name], female    DOB: [birth year], 55 y.o.   MRN: [id number]  HPI Here for wellness and f/u;  Overall doing ok;  Pt denies CP, worsening SOB, DOE, wheezing, orthopnea, PND, worsening LE edema, palpitations, dizziness or syncope.  Pt denies neurological change such as new Headache, facial or extremity weakness.  Pt denies polydipsia, polyuria, or low sugar symptoms. Pt states overall good compliance with treatment and medications, good tolerability, and trying to follow lower cholesterol diet.  Pt denies worsening depressive symptoms, suicidal ideation or panic. No fever, wt loss, night sweats, loss of appetite, or other constitutional symptoms.  Pt states good ability with ADL's, low fall risk, home safety reviewed and adequate, no significant changes in hearing or vision, and occasionally active with exercise.  Does not take nexium - too expensive, and feels she only needs TUMS anyway if she watches the diet Past Medical History  Diagnosis Date  . GLUCOSE INTOLERANCE 01/11/2008  . OBSTRUCTIVE SLEEP APNEA 09/07/2007  . Blepharospasm 10/09/2010  . HYPERTENSION 09/07/2007  . ASTHMATIC BRONCHITIS, ACUTE 03/19/2009  . ALLERGIC RHINITIS 01/11/2008  . ASTHMA 09/07/2007  . OSTEOARTHRITIS 09/07/2007  . Impaired glucose tolerance 03/16/2011   Past Surgical History  Procedure Date  . Knee sx bilateral   . Tailor bunion removal   . Hammer toe surgery   . Open anterior shoulder reconstruction   . Esophagogastroduodenoscopy 10/14/2004    reports that she has quit smoking. She does not have any smokeless tobacco history on file. She reports that she does not drink alcohol or use illicit drugs. family history includes Asthma in her other; Diabetes in her other; Esophageal cancer in her father; Melanoma in her sister; and Ovarian cancer in her other. Allergies  Allergen Reactions  . Amlodipine Besy-Benazepril Hcl     REACTION: Hives  . Penicillins     REACTION: hives    . Pseudoephedrine-Codeine     REACTION: hives   Current Outpatient Prescriptions on File Prior to Visit  Medication Sig Dispense Refill  . albuterol (PROAIR HFA) 108 (90 BASE) MCG/ACT inhaler Inhale 2 puffs into the lungs 4 (four) times daily as needed.        Marland Kitchen amLODipine (NORVASC) 10 MG tablet Take 10 mg by mouth daily.        Marland Kitchen aspirin 81 MG EC tablet Take 81 mg by mouth daily.        . calcium acetate (PHOSLO) 667 MG capsule Take 667 mg by mouth daily.        . celecoxib (CELEBREX) 200 MG capsule Take 200 mg by mouth 2 (two) times daily.        . cetirizine (ZYRTEC) 10 MG tablet Take 10 mg by mouth daily.        . Glucosamine-Chondroit-Vit C-Mn (GLUCOSAMINE 1500 COMPLEX PO) Take by mouth daily.        . montelukast (SINGULAIR) 10 MG tablet Take 10 mg by mouth daily.        . norethindrone (MICRONOR) 0.35 MG tablet Take 1 tablet by mouth daily.        . traMADol (ULTRAM-ER) 100 MG 24 hr tablet Take 100 mg by mouth daily as needed. For pain       . valsartan (DIOVAN) 160 MG tablet Take 160 mg by mouth daily.        Marland Kitchen esomeprazole (NEXIUM) 40 MG capsule Take 40 mg by mouth daily as needed.  Review of Systems Review of Systems  Constitutional: Negative for diaphoresis, activity change, appetite change and unexpected weight change.  HENT: Negative for hearing loss, ear pain, facial swelling, mouth sores and neck stiffness.   Eyes: Negative for pain, redness and visual disturbance.  Respiratory: Negative for shortness of breath and wheezing.   Cardiovascular: Negative for chest pain and palpitations.  Gastrointestinal: Negative for diarrhea, blood in stool, abdominal distention and rectal pain.  Genitourinary: Negative for hematuria, flank pain and decreased urine volume.  Musculoskeletal: Negative for myalgias and joint swelling.  Skin: Negative for color change and wound.  Neurological: Negative for syncope and numbness.  Hematological: Negative for adenopathy.   Psychiatric/Behavioral: Negative for hallucinations, self-injury, decreased concentration and agitation.  Pt continues to have recurring LBP without change in severity, bowel or bladder change, fever, wt loss,  worsening LE pain/numbness/weakness, gait change or falls, but tramadol not working as well recently,  Hard to sit at work and get the ergonomics right b/c whatever she does seems to either make the hip pain worse, of the back worse.    Objective:   Physical Exam BP 138/78  Pulse 93  Temp(Src) 98.4 F (36.9 C) (Oral)  Ht 5\' 7"  (1.702 m)  Wt 260 lb 4 oz (118.049 kg)  BMI 40.76 kg/m2  SpO2 97%  LMP 03/09/2011 Physical Exam  VS noted, morbid obese Constitutional: Pt is oriented to person, place, and time. Appears well-developed and well-nourished.  HENT:  Head: Normocephalic and atraumatic.  Right Ear: External ear normal.  Left Ear: External ear normal.  Nose: Nose normal.  Mouth/Throat: Oropharynx is clear and moist.  Eyes: Conjunctivae and EOM are normal. Pupils are equal, round, and reactive to light.  Neck: Normal range of motion. Neck supple. No JVD present. No tracheal deviation present.  Cardiovascular: Normal rate, regular rhythm, normal heart sounds and intact distal pulses.   Pulmonary/Chest: Effort normal and breath sounds normal.  Abdominal: Soft. Bowel sounds are normal. There is no tenderness.  Musculoskeletal: Normal range of motion. Exhibits no edema.  Lymphadenopathy:  Has no cervical adenopathy.  Neurological: Pt is alert and oriented to person, place, and time. Pt has normal reflexes. No cranial nerve deficit.  Skin: Skin is warm and dry. No rash noted.  Psychiatric:  Has  normal mood and affect. Behavior is normal.         Assessment & Plan:

## 2011-03-18 NOTE — Patient Instructions (Addendum)
Increase the tramadol 24 hr to the 20 mg per day Continue all other medications as before Please return in 6 mo with Lab testing done 3-5 days before

## 2011-03-18 NOTE — Assessment & Plan Note (Addendum)
Asymptomatic - for a1c check today Lab Results  Component Value Date   HGBA1C 6.4 01/09/2010   To f/u 6 mo

## 2011-03-22 ENCOUNTER — Other Ambulatory Visit: Payer: Self-pay | Admitting: Internal Medicine

## 2011-04-08 NOTE — Op Note (Signed)
Tanya [name], Tanya [name]               ACCOUNT NO.:  [id number]   MEDICAL RECORD NO.:  [id number]          PATIENT TYPE:  AMB   LOCATION:  DAY                          FACILITY:  [facility]   PHYSICIAN:  Jene Every, M.D.    DATE OF BIRTH:  1955/12/20   DATE OF PROCEDURE:  01/04/2009  DATE OF DISCHARGE:                               OPERATIVE REPORT   PREOPERATIVE DIAGNOSIS:  Rotator cuff tear right, recurrent.   POSTOPERATIVE DIAGNOSIS:  Rotator cuff tear right, recurrent.   PROCEDURES PERFORMED:  1. Redo rotator cuff repair.  2. Subacromial decompression.  3. Acromioplasty.   BRIEF HISTORY:  A 55 year old who had a remote history of a rotator cuff  repair.  Outside of the practice, had a recent injury with persistent  pain.  MRI indicating retracted tears in the rotator cuff, adhesive  capsulitis was noted as well.  She had failed conservative treatment and  was indicated for a redo repair.  Because of the extensive tearing, the  previous history of the repair and of the biceps, she was indicated for  repair, possible biceps tenodesis.  We did discuss possibly with  inability to repair the tendon, also need of a hemiarthroplasty in the  future.   TECHNIQUE:  Patient in supine beach-chair position, after induction of  adequate anesthesia, 1 gram vancomycin, right shoulder and upper  extremity was prepped and draped in the usual sterile fashion.  A  surgical marker was utilized to delineate the acromion, AC joint, the  coracoid and anterolateral incision through the skin was made passing  over the anterolateral aspect of the acromion approximately 3 cm.  Subcutaneous tissue was dissected with electrocautery utilized to  achieve hemostasis.  The raphe between the anterolateral heads was  nondistinct based upon the previous surgery.  With digital lysis, we  were able to divide what appeared to be the anterolateral heads.  We  then subperiosteally elevated those from the anterolateral  and  anteromedial aspect of the acromion and preserving their full  attachment.  There was significant subacromial adhesions that were noted  throughout and we utilized a combination of digital lysis and  utilization of a Cobb to the glenohumeral joint to lyse these adhesions  which were significant.  They were also noted to be within the  subacromial bursa so we were able to separate that from the tendon  itself.  There was tearing noted essentially almost in a longitudinal  fashion, though it appeared that the majority of the tendon still  attached below the supraspinatus, infraspinatus.  All was very  attenuated.  Wound was copiously irrigated.  The biceps tendon appeared  to be within the bicipital groove.  With flexion/extension of the elbow,  appeared to be intact.  Again severe adhesions were noted, limiting the  full exposure but we felt that the range of motion following the  decompression and lysis of adhesions was significantly improved.  We had  incised along the area of the cuff and debrided and irrigated the  glenohumeral joint, we repaired the rotator cuff tear with #1 Vicryl  figure-of-eight sutures.  We had curetted bone just beneath this for the  side-to-side repair but again it was noted that there was still  attachment out laterally, the severe attenuation of the entire cuff was  noted.  Next, we repaired the raphe with #1 Vicryl interrupted figure-of-  eight sutures as well as through the acromion.  Subcu with 2-  0 Vicryl simple sutures.  Skin was reapproximated with 4-0 subcuticular  Prolene.  Wound was reinforced with Steri-Strips.  Sterile dressing  applied.  Placed in an abduction pillow, extubated without difficulty  and transported to the recovery room in satisfactory condition.      Jene Every, M.D.  Electronically Signed     JB/MEDQ  D:  01/04/2009  T:  01/04/2009  Job:  [id number]   cc:   Roma Schanz, P.A.

## 2011-04-11 NOTE — Op Note (Signed)
Tanya [name], Tanya [name]               ACCOUNT NO.:  [id number]   MEDICAL RECORD NO.:  [id number]          PATIENT TYPE:  AMB   LOCATION:  DAY                          FACILITY:  [facility]   PHYSICIAN:  Jene Every, M.D.    DATE OF BIRTH:  [birth year]   DATE OF PROCEDURE:  08/01/2005  DATE OF DISCHARGE:                                 OPERATIVE REPORT   PREOPERATIVE DIAGNOSIS:  Medial meniscus tear and degenerative joint  disease, left knee.   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   PROCEDURE PERFORMED:  Left knee arthroscopy, partial medial meniscectomy,  chondroplasty of medial femoral condyle, medial tibial plateau, and patella.   ANESTHESIA:  General.   BRIEF HISTORY:  This is a 55 year old with refractory knee pain, MRI showing  meniscus tear, medial compartment narrowing, degenerative arthritis.  Operative intervention is indicated for diagnosis and treatment with  debridement, partial medial meniscectomy.  The risks and benefits were  discussed including bleeding, infection, damage to structures, no change in  symptoms, worsening symptoms, need for repeat in the future.   SURGICAL TECHNIQUE:  With the patient in supine position after the induction  of adequate anesthesia, 1 gram Kefzol, the left lower extremity was prepped  and draped in the usual sterile fashion.  A lateral parapatellar portal and  superomedial parapatellar portal was fashioned with a #11 blade, ingress  cannula atraumatically placed, irrigant was utilized to insufflate the  joint.  Under direct visualization, medial parapatellar portal was fashioned  with a number 11 blade after localization with an 18 gauge needle sparing  the medial meniscus.  Noted in the medial compartment initially was  extensive grade 3 changes and minor grade 4 changes on the  medial meniscus  anteriorly, complex tear of the posterior third of the medial meniscus.  A  basket rongeur was introduced and utilized to perform a partial medial  meniscectomy of the posterior third to a stable base.  50% of the posterior  third was removed.  The remainder was stable to palpation but there were  some longitudinal splits within that.  The shaver was introduced and  utilized to perform chondroplasty in the medial femoral condyle and tibial  plateau.  There is an area of about 1.5 cm in length and 0.5 cm in width  underneath the medial meniscus anteriorly with a grade 4 change.  There was  extensive grade 3 changes over the medial femoral condyle.  There was some  minor fraying of the ACL and PCL, lateral meniscus lateral compartment  revealed some minor softening of a cartilage.  The meniscus was without  evidence of tear.  There was some hypertrophic scar tissue noted underneath  the patella and a plica medially.  A chondroplasty of the patella was  performed and the plica medially was excised.  There was normal  patellofemoral tracking, some arthrosis in the sulcus.  Chondroplasty was  performed there, as well.  The gutters were unremarkable.  The knee was re-  examined, no evidence of further pathology, minimal arthroscopic  intervention.  After copious lavage, all instrumentation was removed, the  portals were closed with 4-0 nylon simple sutures.  0.25% Marcaine  with epinephrine was infiltrated in the joint.  The wound was dressed  sterilely.  She was awakened without difficulty and transported to the  recovery room in satisfactory condition.  The patient tolerated the  procedure well and there were no complications.      Jene Every, M.D.  Electronically Signed     JB/MEDQ  D:  08/01/2005  T:  08/01/2005  Job:  [id number]

## 2011-09-11 ENCOUNTER — Other Ambulatory Visit (INDEPENDENT_AMBULATORY_CARE_PROVIDER_SITE_OTHER): Payer: BC Managed Care – PPO

## 2011-09-11 DIAGNOSIS — R7302 Impaired glucose tolerance (oral): Secondary | ICD-10-CM

## 2011-09-11 DIAGNOSIS — R7309 Other abnormal glucose: Secondary | ICD-10-CM

## 2011-09-11 LAB — LIPID PANEL
Cholesterol: 118 mg/dL (ref 0–200)
HDL: 48 mg/dL (ref >39.00)

## 2011-09-11 LAB — BASIC METABOLIC PANEL
CO2: 28 mEq/L (ref 19–32)
Glucose, Bld: 117 mg/dL — ABNORMAL HIGH (ref 70–99)
Potassium: 4.1 mEq/L (ref 3.5–5.1)
Sodium: 141 mEq/L (ref 135–145)

## 2011-09-11 LAB — HEMOGLOBIN A1C: Hgb A1c MFr Bld: 6.4 % (ref 4.6–6.5)

## 2011-09-18 ENCOUNTER — Encounter: Payer: Self-pay | Admitting: Internal Medicine

## 2011-09-18 ENCOUNTER — Ambulatory Visit (INDEPENDENT_AMBULATORY_CARE_PROVIDER_SITE_OTHER): Payer: BC Managed Care – PPO | Admitting: Internal Medicine

## 2011-09-18 VITALS — BP 118/80 | HR 90 | Temp 98.2°F | Ht 67.0 in | Wt 250.2 lb

## 2011-09-18 DIAGNOSIS — M1990 Unspecified osteoarthritis, unspecified site: Secondary | ICD-10-CM

## 2011-09-18 DIAGNOSIS — G5139 Clonic hemifacial spasm, unspecified: Secondary | ICD-10-CM | POA: Insufficient documentation

## 2011-09-18 DIAGNOSIS — Z0000 Encounter for general adult medical examination without abnormal findings: Secondary | ICD-10-CM

## 2011-09-18 DIAGNOSIS — I10 Essential (primary) hypertension: Secondary | ICD-10-CM

## 2011-09-18 DIAGNOSIS — R7309 Other abnormal glucose: Secondary | ICD-10-CM

## 2011-09-18 DIAGNOSIS — R7302 Impaired glucose tolerance (oral): Secondary | ICD-10-CM

## 2011-09-18 DIAGNOSIS — J45909 Unspecified asthma, uncomplicated: Secondary | ICD-10-CM

## 2011-09-18 MED ORDER — TRAMADOL HCL ER 200 MG PO TB24: 200.0000 mg | ORAL_TABLET | Freq: Every day | ORAL | Status: DC

## 2011-09-18 NOTE — Progress Notes (Signed)
Subjective:    Patient ID: Tanya [name], female    DOB: [birth year], 55 y.o.   MRN: [id number]  HPI  Here to f/u; overall doing ok,  Pt denies chest pain, increased sob or doe, wheezing, orthopnea, PND, increased LE swelling, palpitations, dizziness or syncope.  Pt denies new neurological symptoms such as new headache, or facial or extremity weakness or numbness   Pt denies polydipsia, polyuria, or low sugar symptoms such as weakness or confusion improved with po intake.  Pt states overall good compliance with meds, trying to follow lower cholesterol, diabetic diet, wt overall stable but little exercise however.  Overall pain controlled and doing well on current ultram ER. No other acute complaints today Past Medical History  Diagnosis Date  . GLUCOSE INTOLERANCE 01/11/2008  . OBSTRUCTIVE SLEEP APNEA 09/07/2007  . Blepharospasm 10/09/2010  . HYPERTENSION 09/07/2007  . ASTHMATIC BRONCHITIS, ACUTE 03/19/2009  . ALLERGIC RHINITIS 01/11/2008  . ASTHMA 09/07/2007  . OSTEOARTHRITIS 09/07/2007  . Impaired glucose tolerance 03/16/2011   Past Surgical History  Procedure Date  . Knee sx bilateral   . Tailor bunion removal   . Hammer toe surgery   . Open anterior shoulder reconstruction   . Esophagogastroduodenoscopy 10/14/2004    reports that she has quit smoking. She does not have any smokeless tobacco history on file. She reports that she does not drink alcohol or use illicit drugs. family history includes Asthma in her other; Diabetes in her other; Esophageal cancer in her father; Melanoma in her sister; and Ovarian cancer in her other. Allergies  Allergen Reactions  . Amlodipine Besy-Benazepril Hcl     REACTION: Hives  . Penicillins     REACTION: hives  . Pseudoephedrine-Codeine     REACTION: hives   Current Outpatient Prescriptions on File Prior to Visit  Medication Sig Dispense Refill  . albuterol (PROAIR HFA) 108 (90 BASE) MCG/ACT inhaler Inhale 2 puffs into the lungs 4 (four) times  daily as needed.        Marland Kitchen amLODipine (NORVASC) 10 MG tablet TAKE 1 TABLET BY MOUTH EVERY DAY  90 tablet  3  . aspirin 81 MG EC tablet Take 81 mg by mouth daily.        Marland Kitchen azithromycin (AZASITE) 1 % ophthalmic solution Place 1 drop into both eyes daily.        . calcium acetate (PHOSLO) 667 MG capsule Take 667 mg by mouth daily.        . CELEBREX 200 MG capsule TAKE 1 CAPSULE BY MOUTH TWO TIMES A DAY AS NEEDED  180 capsule  3  . cetirizine (ZYRTEC) 10 MG tablet TAKE 1 TABLET BY MOUTH ONCE DAILY AS NEEDED FOR ALLERGIES  90 tablet  3  . DIOVAN 160 MG tablet TAKE 1 TABLET BY MOUTH ONCE A DAY  90 tablet  3  . doxycycline (DORYX) 100 MG EC tablet Take 100 mg by mouth daily.        . Glucosamine-Chondroit-Vit C-Mn (GLUCOSAMINE 1500 COMPLEX PO) Take by mouth daily.        . norethindrone (MICRONOR) 0.35 MG tablet Take 1 tablet by mouth daily.        Marland Kitchen SINGULAIR 10 MG tablet TAKE 1 TABLET BY MOUTH EVERY DAY  90 tablet  3   Review of Systems Review of Systems  Constitutional: Negative for diaphoresis and unexpected weight change.  HENT: Negative for drooling and tinnitus.   Eyes: Negative for photophobia and visual disturbance.  Respiratory: Negative for choking  and stridor.   Gastrointestinal: Negative for vomiting and blood in stool.  Genitourinary: Negative for hematuria and decreased urine volume.  Musculoskeletal: Negative for gait problem.  Skin: Negative for color change and wound.  Neurological: Negative for tremors and numbness.  Psychiatric/Behavioral: Negative for decreased concentration. The patient is not hyperactive.       Objective:   Physical Exam BP 118/80  Pulse 90  Temp(Src) 98.2 F (36.8 C) (Oral)  Ht 5\' 7"  (1.702 m)  Wt 250 lb 4 oz (113.513 kg)  BMI 39.19 kg/m2  SpO2 97%  LMP 08/28/2011 Physical Exam  VS noted Constitutional: Pt appears well-developed and well-nourished.  HENT: Head: Normocephalic.  Right Ear: External ear normal.  Left Ear: External ear normal.    Eyes: Conjunctivae and EOM are normal. Pupils are equal, round, and reactive to light.  Neck: Normal range of motion. Neck supple.  Cardiovascular: Normal rate and regular rhythm.   Pulmonary/Chest: Effort normal and breath sounds normal.  Abd:  Soft, NT, non-distended, + BS Neurological: Pt is alert. No cranial nerve deficit.  Skin: Skin is warm. No erythema.  Psychiatric: Pt behavior is normal. Thought content normal.     Assessment & Plan:

## 2011-09-18 NOTE — Patient Instructions (Signed)
Continue all other medications as before  

## 2011-09-18 NOTE — Assessment & Plan Note (Signed)
stable overall by hx and exam, most recent data reviewed with pt, and pt to continue medical treatment as before  SpO2 Readings from Last 3 Encounters:  09/18/11 97%  03/18/11 97%  01/24/10 96%

## 2011-09-18 NOTE — Assessment & Plan Note (Signed)
stable overall by hx and exam, most recent data reviewed with pt, and pt to continue medical treatment as before  BP Readings from Last 3 Encounters:  09/18/11 118/80  03/18/11 138/78  01/24/10 108/80

## 2011-09-18 NOTE — Assessment & Plan Note (Signed)
stable overall by hx and exam, most recent data reviewed with pt, and pt to continue medical treatment as before  Lab Results  Component Value Date   HGBA1C 6.4 09/11/2011

## 2011-09-18 NOTE — Assessment & Plan Note (Signed)
stable overall by hx and exam, most recent data reviewed with pt, and pt to continue medical treatment as before - for pain med refill

## 2011-10-11 ENCOUNTER — Other Ambulatory Visit: Payer: Self-pay | Admitting: Internal Medicine

## 2011-12-11 ENCOUNTER — Other Ambulatory Visit: Payer: Self-pay | Admitting: Obstetrics and Gynecology

## 2011-12-11 DIAGNOSIS — Z1231 Encounter for screening mammogram for malignant neoplasm of breast: Secondary | ICD-10-CM

## 2012-01-07 ENCOUNTER — Other Ambulatory Visit (INDEPENDENT_AMBULATORY_CARE_PROVIDER_SITE_OTHER): Payer: Managed Care, Other (non HMO)

## 2012-01-07 DIAGNOSIS — R7309 Other abnormal glucose: Secondary | ICD-10-CM

## 2012-01-07 DIAGNOSIS — R7302 Impaired glucose tolerance (oral): Secondary | ICD-10-CM

## 2012-01-07 DIAGNOSIS — Z0000 Encounter for general adult medical examination without abnormal findings: Secondary | ICD-10-CM

## 2012-01-07 LAB — CBC WITH DIFFERENTIAL/PLATELET
Basophils Relative: 0.4 % (ref 0.0–3.0)
Eosinophils Absolute: 0.1 10*3/uL (ref 0.0–0.7)
Eosinophils Relative: 1.5 % (ref 0.0–5.0)
MCHC: 33.3 g/dL (ref 30.0–36.0)
MCV: 92.6 fl (ref 78.0–100.0)
Monocytes Absolute: 0.4 10*3/uL (ref 0.1–1.0)
Monocytes Relative: 5.7 % (ref 3.0–12.0)
Neutrophils Relative %: 64.8 % (ref 43.0–77.0)
Platelets: 182 10*3/uL (ref 150.0–400.0)
RBC: 4.28 Mil/uL (ref 3.87–5.11)
WBC: 6.5 10*3/uL (ref 4.5–10.5)

## 2012-01-07 LAB — BASIC METABOLIC PANEL
BUN: 17 mg/dL (ref 6–23)
CO2: 27 mEq/L (ref 19–32)
Calcium: 9.6 mg/dL (ref 8.4–10.5)
Chloride: 108 mEq/L (ref 96–112)
Creatinine, Ser: 0.7 mg/dL (ref 0.4–1.2)
Potassium: 4.3 mEq/L (ref 3.5–5.1)

## 2012-01-07 LAB — HEPATIC FUNCTION PANEL
ALT: 20 U/L (ref 0–35)
Albumin: 4.1 g/dL (ref 3.5–5.2)
Total Bilirubin: 0.6 mg/dL (ref 0.3–1.2)

## 2012-01-07 LAB — URINALYSIS, ROUTINE W REFLEX MICROSCOPIC
Nitrite: NEGATIVE
Specific Gravity, Urine: 1.015 (ref 1.000–1.030)
Total Protein, Urine: NEGATIVE
Urine Glucose: NEGATIVE
pH: 7 (ref 5.0–8.0)

## 2012-01-07 LAB — LIPID PANEL
Cholesterol: 119 mg/dL (ref 0–200)
HDL: 46.3 mg/dL (ref >39.00)
LDL Cholesterol: 66 mg/dL (ref 0–99)
Total CHOL/HDL Ratio: 3
Triglycerides: 35 mg/dL (ref 0.0–149.0)

## 2012-01-07 LAB — HEMOGLOBIN A1C: Hgb A1c MFr Bld: 6.5 % (ref 4.6–6.5)

## 2012-01-07 LAB — TSH: TSH: 0.77 u[IU]/mL (ref 0.35–5.50)

## 2012-01-12 ENCOUNTER — Encounter: Payer: Self-pay | Admitting: Internal Medicine

## 2012-01-12 ENCOUNTER — Ambulatory Visit
Admission: RE | Admit: 2012-01-12 | Discharge: 2012-01-12 | Disposition: A | Discharge status: Home / Self Care | Payer: Managed Care, Other (non HMO) | Source: Ambulatory Visit | Attending: Obstetrics and Gynecology | Admitting: Obstetrics and Gynecology

## 2012-01-12 ENCOUNTER — Ambulatory Visit (INDEPENDENT_AMBULATORY_CARE_PROVIDER_SITE_OTHER): Payer: Managed Care, Other (non HMO) | Admitting: Internal Medicine

## 2012-01-12 VITALS — BP 122/80 | HR 81 | Temp 97.2°F | Ht 67.0 in | Wt 240.0 lb

## 2012-01-12 DIAGNOSIS — R7302 Impaired glucose tolerance (oral): Secondary | ICD-10-CM

## 2012-01-12 DIAGNOSIS — R7309 Other abnormal glucose: Secondary | ICD-10-CM

## 2012-01-12 DIAGNOSIS — Z0000 Encounter for general adult medical examination without abnormal findings: Secondary | ICD-10-CM

## 2012-01-12 DIAGNOSIS — Z1231 Encounter for screening mammogram for malignant neoplasm of breast: Secondary | ICD-10-CM

## 2012-01-12 MED ORDER — AMLODIPINE BESYLATE 10 MG PO TABS: 10.0000 mg | ORAL_TABLET | Freq: Every day | ORAL | Status: DC

## 2012-01-12 MED ORDER — VALSARTAN 160 MG PO TABS: 160.0000 mg | ORAL_TABLET | Freq: Every day | ORAL | Status: DC

## 2012-01-12 MED ORDER — ALBUTEROL SULFATE HFA 108 (90 BASE) MCG/ACT IN AERS: 2.0000 | INHALATION_SPRAY | Freq: Four times a day (QID) | RESPIRATORY_TRACT | Status: DC | PRN

## 2012-01-12 MED ORDER — MONTELUKAST SODIUM 10 MG PO TABS: 10.0000 mg | ORAL_TABLET | Freq: Every day | ORAL | Status: DC

## 2012-01-12 MED ORDER — CETIRIZINE HCL 10 MG PO TABS: 10.0000 mg | ORAL_TABLET | Freq: Every day | ORAL | Status: DC

## 2012-01-12 MED ORDER — CELECOXIB 200 MG PO CAPS: 200.0000 mg | ORAL_CAPSULE | Freq: Two times a day (BID) | ORAL | Status: DC

## 2012-01-12 MED ORDER — CALCIUM ACETATE 667 MG PO CAPS: 667.0000 mg | ORAL_CAPSULE | Freq: Every day | ORAL | Status: DC

## 2012-01-12 MED ORDER — TRAMADOL HCL ER 200 MG PO TB24: 200.0000 mg | ORAL_TABLET | Freq: Every day | ORAL | Status: DC

## 2012-01-12 NOTE — Patient Instructions (Signed)
Continue all other medications as before Your medication refill were sent today Please call if you need further refills Please keep your appointments with your specialists as you have planned - GYN and mammogram Please return in 1 year for your yearly visit, or sooner if needed, with Lab testing done 3-5 days before

## 2012-01-12 NOTE — Assessment & Plan Note (Signed)

## 2012-01-12 NOTE — Progress Notes (Signed)
Subjective:    Patient ID: Tanya [name], female    DOB: 1956-11-16, 56 y.o.   MRN: [id number]  HPI  Here for wellness and f/u;  Overall doing ok;  Pt denies CP, worsening SOB, DOE, wheezing, orthopnea, PND, worsening LE edema, palpitations, dizziness or syncope.  Pt denies neurological change such as new Headache, facial or extremity weakness.  Pt denies polydipsia, polyuria, or low sugar symptoms. Pt states overall good compliance with treatment and medications, good tolerability, and trying to follow lower cholesterol diet.  Pt denies worsening depressive symptoms, suicidal ideation or panic. No fever, wt loss, night sweats, loss of appetite, or other constitutional symptoms.  Pt states good ability with ADL's, low fall risk, home safety reviewed and adequate, no significant changes in hearing or vision, and active with exercise with stat bike 5 days per wk, lost about 10 lbs intentionally.  Has appt for pap and mammogram later today with GY[facility], Dr Jackelyn Knife Past Medical History  Diagnosis Date  . GLUCOSE INTOLERANCE 01/11/2008  . OBSTRUCTIVE SLEEP APNEA 09/07/2007  . Blepharospasm 10/09/2010  . HYPERTENSION 09/07/2007  . ASTHMATIC BRONCHITIS, ACUTE 03/19/2009  . ALLERGIC RHINITIS 01/11/2008  . ASTHMA 09/07/2007  . OSTEOARTHRITIS 09/07/2007  . Impaired glucose tolerance 03/16/2011   Past Surgical History  Procedure Date  . Knee sx bilateral   . Tailor bunion removal   . Hammer toe surgery   . Open anterior shoulder reconstruction   . Esophagogastroduodenoscopy 10/14/2004    reports that she has quit smoking. She does not have any smokeless tobacco history on file. She reports that she does not drink alcohol or use illicit drugs. family history includes Asthma in her other; Diabetes in her other; Esophageal cancer in her father; Melanoma in her sister; and Ovarian cancer in her other. Allergies  Allergen Reactions  . Amlodipine Besy-Benazepril Hcl     REACTION: Hives  . Penicillins    REACTION: hives  . Pseudoephedrine-Codeine     REACTION: hives   Current Outpatient Prescriptions on File Prior to Visit  Medication Sig Dispense Refill  . albuterol (PROAIR HFA) 108 (90 BASE) MCG/ACT inhaler Inhale 2 puffs into the lungs 4 (four) times daily as needed.        Marland Kitchen amLODipine (NORVASC) 10 MG tablet TAKE 1 TABLET BY MOUTH EVERY DAY  90 tablet  3  . aspirin 81 MG EC tablet Take 81 mg by mouth daily.        . calcium acetate (PHOSLO) 667 MG capsule Take 667 mg by mouth daily.        . CELEBREX 200 MG capsule TAKE 1 CAPSULE BY MOUTH TWO TIMES A DAY AS NEEDED  180 capsule  3  . cetirizine (ZYRTEC) 10 MG tablet TAKE 1 TABLET BY MOUTH ONCE DAILY AS NEEDED FOR ALLERGIES  90 tablet  3  . DIOVAN 160 MG tablet TAKE 1 TABLET BY MOUTH ONCE A DAY  90 tablet  3  . doxycycline (DORYX) 100 MG EC tablet Take 100 mg by mouth daily.        . Glucosamine-Chondroit-Vit C-Mn (GLUCOSAMINE 1500 COMPLEX PO) Take by mouth daily.        . norethindrone (MICRONOR) 0.35 MG tablet Take 1 tablet by mouth daily.        Marland Kitchen SINGULAIR 10 MG tablet TAKE 1 TABLET BY MOUTH EVERY DAY  90 tablet  3  . traMADol (ULTRAM-ER) 200 MG 24 hr tablet Take 1 tablet (200 mg total) by mouth daily.  90  tablet  1    Review of Systems Review of Systems  Constitutional: Negative for diaphoresis, activity change, appetite change and unexpected weight change.  HENT: Negative for hearing loss, ear pain, facial swelling, mouth sores and neck stiffness.   Eyes: Negative for pain, redness and visual disturbance.  Respiratory: Negative for shortness of breath and wheezing.   Cardiovascular: Negative for chest pain and palpitations.  Gastrointestinal: Negative for diarrhea, blood in stool, abdominal distention and rectal pain.  Genitourinary: Negative for hematuria, flank pain and decreased urine volume.  Musculoskeletal: Negative for myalgias and joint swelling.  Skin: Negative for color change and wound.  Neurological: Negative for  syncope and numbness.  Hematological: Negative for adenopathy.  Psychiatric/Behavioral: Negative for hallucinations, self-injury, decreased concentration and agitation.      Objective:   Physical Exam BP 122/80  Pulse 81  Temp(Src) 97.2 F (36.2 C) (Oral)  Ht 5\' 7"  (1.702 m)  Wt 240 lb (108.863 kg)  BMI 37.59 kg/m2  SpO2 97%  LMP 10/13/2011 Physical Exam  VS noted Constitutional: Pt is oriented to person, place, and time. Appears well-developed and well-nourished.  HENT:  Head: Normocephalic and atraumatic.  Right Ear: External ear normal.  Left Ear: External ear normal.  Nose: Nose normal.  Mouth/Throat: Oropharynx is clear and moist.  Eyes: Conjunctivae and EOM are normal. Pupils are equal, round, and reactive to light.  Neck: Normal range of motion. Neck supple. No JVD present. No tracheal deviation present.  Cardiovascular: Normal rate, regular rhythm, normal heart sounds and intact distal pulses.   Pulmonary/Chest: Effort normal and breath sounds normal.  Abdominal: Soft. Bowel sounds are normal. There is no tenderness.  Musculoskeletal: Normal range of motion. Exhibits no edema.  Lymphadenopathy:  Has no cervical adenopathy.  Neurological: Pt is alert and oriented to person, place, and time. Pt has normal reflexes. No cranial nerve deficit.  Skin: Skin is warm and dry. No rash noted.  Psychiatric:  Has  normal mood and affect. Behavior is normal.     Assessment & Plan:

## 2012-01-12 NOTE — Assessment & Plan Note (Signed)
Asympt, for a1c,  to f/u any worsening symptoms or concerns, cont wt loss efforts

## 2012-01-20 ENCOUNTER — Telehealth: Payer: Self-pay

## 2012-01-20 MED ORDER — PROMETHAZINE HCL 25 MG PO TABS: 25.0000 mg | ORAL_TABLET | Freq: Four times a day (QID) | ORAL | Status: AC | PRN

## 2012-01-20 NOTE — Telephone Encounter (Signed)
Patient informed. 

## 2012-01-20 NOTE — Telephone Encounter (Signed)
Pt called stating she has caught a stomach bug from her husband and has been having nausea and vomiting x 1 day. Pt is requesting Rx to treat, please advise.

## 2012-01-20 NOTE — Telephone Encounter (Signed)
Ok fo phenergan prn, and can sue immodium otc as well,  to f/u any worsening symptoms or concerns

## 2012-03-06 ENCOUNTER — Other Ambulatory Visit: Payer: Self-pay | Admitting: Internal Medicine

## 2012-04-04 ENCOUNTER — Other Ambulatory Visit: Payer: Self-pay | Admitting: Internal Medicine

## 2012-05-25 ENCOUNTER — Telehealth: Payer: Self-pay

## 2012-05-25 DIAGNOSIS — Z9989 Dependence on other enabling machines and devices: Secondary | ICD-10-CM

## 2012-05-25 DIAGNOSIS — G4733 Obstructive sleep apnea (adult) (pediatric): Secondary | ICD-10-CM

## 2012-05-25 NOTE — Telephone Encounter (Signed)
I dont normally prescribed osa treatment or supplies;  Normally this goes to the treating MD;  If she has been followed up recently, I can refer to Pulm at [facility] for f/u and new equipment

## 2012-05-25 NOTE — Telephone Encounter (Signed)
°  Caller: Tanya [name]/Patient; PCP: Oliver Barre; CB#: [phone]; Call regarding CPAP Is Obsolete; Can No Longer Get Filters.; After previous note taken; RN discovered note from MD in Epic for 05/25/12 indicating that he would refer to [facility] Pulmonologist for follow up and new equipment.  MD  message given to pt.  Information noted and sent to [facility] CAN pool for follow up. Please call back about Pulmonology referral.    Caller: Tanya [name]/Patient; PCP: Oliver Barre; CB#: [phone]; Call regarding CPAP Is Obsolete; Can No Longer Get Filters.;  Asking for RX for new CPAP to be sent to [facility] at fax [phone] Attn:  Otis Brace.  Info noted and sent to MD for per Office Note.  Please call pt back.

## 2012-05-25 NOTE — Telephone Encounter (Signed)
Pt called stating that the CPAP machine she has been using since 1997 requires replacement parts that are no longer available. Pt is requesting Rx for new CPAP machine

## 2012-05-25 NOTE — Telephone Encounter (Signed)
Called the patient left message to call back 

## 2012-05-26 NOTE — Telephone Encounter (Signed)
Called the patient and she would like a referral to [facility] Pulmonary

## 2012-05-26 NOTE — Telephone Encounter (Signed)
Done per emr 

## 2012-05-26 NOTE — Telephone Encounter (Signed)
Called the patient left message to call back 

## 2012-06-16 DIAGNOSIS — G518 Other disorders of facial nerve: Secondary | ICD-10-CM | POA: Insufficient documentation

## 2012-07-05 ENCOUNTER — Institutional Professional Consult (permissible substitution): Payer: Managed Care, Other (non HMO) | Admitting: Pulmonary Disease

## 2012-07-13 ENCOUNTER — Encounter: Payer: Self-pay | Admitting: Pulmonary Disease

## 2012-07-13 ENCOUNTER — Ambulatory Visit (INDEPENDENT_AMBULATORY_CARE_PROVIDER_SITE_OTHER): Payer: Managed Care, Other (non HMO) | Admitting: Pulmonary Disease

## 2012-07-13 VITALS — BP 132/84 | HR 100 | Temp 98.1°F | Ht 67.0 in | Wt 250.0 lb

## 2012-07-13 DIAGNOSIS — G4733 Obstructive sleep apnea (adult) (pediatric): Secondary | ICD-10-CM

## 2012-07-13 NOTE — Patient Instructions (Addendum)
Will get you a new cpap machine, and set on your current pressure. Work on weight loss If doing well, followup with Dr. Vassie Loll in one year.  Let us know if issues with cpap.

## 2012-07-13 NOTE — Progress Notes (Signed)
  Subjective:    Patient ID: Tanya [name], female    DOB: October 15, 1956, 56 y.o.   MRN: [id number]  HPI Patient comes in today for an acute sick visit.  She has a history sleep apnea, and has not been seen by Dr. Vassie Loll for 2 years.  The patient has a CPAP machine that is at least 54 years old, but has not been able to get new filters or supplies.  She then went back to a machine that was 54 years old, but it is now making a noise and not working properly.  The patient uses a nasal CPAP mask, and has been keeping up with mask changes.  Her current CPAP pressure is 11 cm.  She feels that she is resting well at night, and is satisfied with her daytime alertness.  She states her weight is down 30 pounds over the last 2 years.   Review of Systems  Constitutional: Negative for fever and unexpected weight change.  HENT: Positive for congestion. Negative for ear pain, nosebleeds, sore throat, rhinorrhea, sneezing, trouble swallowing, dental problem, postnasal drip and sinus pressure.   Eyes: Negative for redness and itching.  Respiratory: Negative for cough, chest tightness, shortness of breath and wheezing.   Cardiovascular: Negative for palpitations and leg swelling.  Gastrointestinal: Negative for nausea and vomiting.  Genitourinary: Negative for dysuria.  Musculoskeletal: Negative for joint swelling.  Skin: Negative for rash.  Neurological: Negative for headaches.  Hematological: Does not bruise/bleed easily.  Psychiatric/Behavioral: Negative for dysphoric mood. The patient is not nervous/anxious.   All other systems reviewed and are negative.       Objective:   Physical Exam Obese female in no acute distress No skin breakdown or pressure necrosis from CPAP mask Nose without purulence or discharge noted Oropharynx with mild elongation of the soft palate and uvula Neck without lymphadenopathy or thyromegaly Chest clear to auscultation Cardiac exam with regular rate and rhythm Lower  extremities with mild edema, no cyanosis Alert and oriented, does not appear to be sleepy, moves all 4 extremities.       Assessment & Plan:

## 2012-07-13 NOTE — Assessment & Plan Note (Signed)
The patient has a long history of sleep apnea, and is overdue for a replacement CPAP device.  Will order this through her DME, and I've encouraged her to work aggressively on weight loss.  She will need yearly followup with her primary sleep physician.

## 2012-08-04 ENCOUNTER — Telehealth: Payer: Self-pay | Admitting: Pulmonary Disease

## 2012-08-04 NOTE — Telephone Encounter (Signed)
Spoke with patient-states she had a formal sleep study in 1997 and a compliance report in 2011; Unable to get a new CPAP due to sleep study being too old; I explained to patient that her insurance company is mandating the need for new updated sleep study not her DME. Pt understands this but states she can not afford a sleep study at this time. KC please advise.

## 2012-08-04 NOTE — Telephone Encounter (Signed)
Called and spoke with pt and she is aware of KC recs.  She stated that she did have a home sleep study done 1 1/2 year ago and her insurance is not requiring her to repeat this test but the DME company will not give her a new machine without a new test.  She is waiting for chris from the DME to call her back and let her know what he has found out. Pt is aware to call us back if she needs anything further.  Pt is aware.

## 2012-08-04 NOTE — Telephone Encounter (Signed)
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  Let her know that I don't think she needs a sleep study either, but we cannot do anything if insurance refuses to pay without a study.  She could file a formal complaint or we could do a home sleep test instead of sleep lab test.  This is 1/5 the cost.  You may want to verify with dme this is the [employer] and not the dme.  They make up stuff all the time that is completely false!  I have had plenty of pt's get new machines with sleep studies that old without retesting.

## 2012-10-17 ENCOUNTER — Other Ambulatory Visit: Payer: Self-pay | Admitting: Pulmonary Disease

## 2012-11-18 ENCOUNTER — Other Ambulatory Visit: Payer: Self-pay | Admitting: Obstetrics and Gynecology

## 2012-11-18 DIAGNOSIS — Z1231 Encounter for screening mammogram for malignant neoplasm of breast: Secondary | ICD-10-CM

## 2012-12-22 ENCOUNTER — Other Ambulatory Visit: Payer: Self-pay

## 2012-12-22 MED ORDER — VALSARTAN 160 MG PO TABS: 160.0000 mg | ORAL_TABLET | Freq: Every day | ORAL | Status: DC

## 2013-01-10 ENCOUNTER — Other Ambulatory Visit: Payer: Self-pay | Admitting: Internal Medicine

## 2013-01-19 ENCOUNTER — Other Ambulatory Visit (INDEPENDENT_AMBULATORY_CARE_PROVIDER_SITE_OTHER): Payer: Managed Care, Other (non HMO)

## 2013-01-19 LAB — URINALYSIS, ROUTINE W REFLEX MICROSCOPIC
Bilirubin Urine: NEGATIVE
Hgb urine dipstick: NEGATIVE
Leukocytes, UA: NEGATIVE
Nitrite: NEGATIVE
pH: 6.5 (ref 5.0–8.0)

## 2013-01-19 LAB — HEPATIC FUNCTION PANEL
Bilirubin, Direct: 0.1 mg/dL (ref 0.0–0.3)
Total Bilirubin: 0.7 mg/dL (ref 0.3–1.2)

## 2013-01-19 LAB — CBC WITH DIFFERENTIAL/PLATELET
Eosinophils Absolute: 0.1 10*3/uL (ref 0.0–0.7)
MCHC: 34 g/dL (ref 30.0–36.0)
MCV: 89.3 fl (ref 78.0–100.0)
Monocytes Absolute: 0.4 10*3/uL (ref 0.1–1.0)
Neutrophils Relative %: 65.5 % (ref 43.0–77.0)
Platelets: 193 10*3/uL (ref 150.0–400.0)
WBC: 6.6 10*3/uL (ref 4.5–10.5)

## 2013-01-19 LAB — BASIC METABOLIC PANEL
Calcium: 9.6 mg/dL (ref 8.4–10.5)
Creatinine, Ser: 0.7 mg/dL (ref 0.4–1.2)

## 2013-01-19 LAB — LIPID PANEL
HDL: 48.1 mg/dL (ref >39.00)
LDL Cholesterol: 67 mg/dL (ref 0–99)
Total CHOL/HDL Ratio: 3
Triglycerides: 63 mg/dL (ref 0.0–149.0)
VLDL: 12.6 mg/dL (ref 0.0–40.0)

## 2013-01-19 LAB — HEMOGLOBIN A1C: Hgb A1c MFr Bld: 6.2 % (ref 4.6–6.5)

## 2013-01-21 ENCOUNTER — Ambulatory Visit (INDEPENDENT_AMBULATORY_CARE_PROVIDER_SITE_OTHER): Payer: Managed Care, Other (non HMO) | Admitting: Pulmonary Disease

## 2013-01-21 ENCOUNTER — Encounter: Payer: Self-pay | Admitting: Internal Medicine

## 2013-01-21 ENCOUNTER — Ambulatory Visit
Admission: RE | Admit: 2013-01-21 | Discharge: 2013-01-21 | Disposition: A | Discharge status: Home / Self Care | Payer: Managed Care, Other (non HMO) | Source: Ambulatory Visit | Attending: Obstetrics and Gynecology | Admitting: Obstetrics and Gynecology

## 2013-01-21 ENCOUNTER — Encounter: Payer: Self-pay | Admitting: Pulmonary Disease

## 2013-01-21 ENCOUNTER — Ambulatory Visit (INDEPENDENT_AMBULATORY_CARE_PROVIDER_SITE_OTHER): Payer: Managed Care, Other (non HMO) | Admitting: Internal Medicine

## 2013-01-21 VITALS — BP 138/78 | HR 74 | Temp 98.4°F | Ht 67.0 in | Wt 252.0 lb

## 2013-01-21 VITALS — BP 118/70 | HR 70 | Temp 98.5°F | Ht 67.0 in | Wt 246.0 lb

## 2013-01-21 DIAGNOSIS — H9190 Unspecified hearing loss, unspecified ear: Secondary | ICD-10-CM | POA: Insufficient documentation

## 2013-01-21 DIAGNOSIS — I10 Essential (primary) hypertension: Secondary | ICD-10-CM

## 2013-01-21 DIAGNOSIS — G4733 Obstructive sleep apnea (adult) (pediatric): Secondary | ICD-10-CM

## 2013-01-21 MED ORDER — AMLODIPINE BESYLATE 10 MG PO TABS: 10.0000 mg | ORAL_TABLET | Freq: Every day | ORAL | Status: DC

## 2013-01-21 MED ORDER — TRAMADOL HCL ER 200 MG PO TB24: 200.0000 mg | ORAL_TABLET | Freq: Every day | ORAL | Status: DC | PRN

## 2013-01-21 MED ORDER — CELECOXIB 200 MG PO CAPS: 200.0000 mg | ORAL_CAPSULE | Freq: Two times a day (BID) | ORAL | Status: DC | PRN

## 2013-01-21 MED ORDER — MONTELUKAST SODIUM 10 MG PO TABS: ORAL_TABLET | ORAL | Status: DC

## 2013-01-21 MED ORDER — VALSARTAN 160 MG PO TABS: 160.0000 mg | ORAL_TABLET | Freq: Every day | ORAL | Status: DC

## 2013-01-21 MED ORDER — CETIRIZINE HCL 10 MG PO TABS: ORAL_TABLET | ORAL | Status: DC

## 2013-01-21 NOTE — Progress Notes (Signed)
Subjective:    Patient ID: Tanya [name], female    DOB: 05/13/1956, 57 y.o.   MRN: [id number]  HPI Here for wellness and f/u;  Overall doing ok;  Pt denies CP, worsening SOB, DOE, wheezing, orthopnea, PND, worsening LE edema, palpitations, dizziness or syncope.  Pt denies neurological change such as new headache, facial or extremity weakness.  Pt denies polydipsia, polyuria, or low sugar symptoms. Pt states overall good compliance with treatment and medications, good tolerability, and has been trying to follow lower cholesterol diet.  Pt denies worsening depressive symptoms, suicidal ideation or panic. No fever, night sweats, wt loss, loss of appetite, or other constitutional symptoms.  Pt states good ability with ADL's, has low fall risk, home safety reviewed and adequate, no other significant changes in hearing or vision, and only occasionally active with exercise.  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 GLUCOSE INTOLERANCE 01/11/2008  . OBSTRUCTIVE SLEEP APNEA 09/07/2007  . Blepharospasm 10/09/2010  . HYPERTENSION 09/07/2007  . ASTHMATIC BRONCHITIS, ACUTE 03/19/2009  . ALLERGIC RHINITIS 01/11/2008  . ASTHMA 09/07/2007  . OSTEOARTHRITIS 09/07/2007  . Impaired glucose tolerance 03/16/2011   Past Surgical History  Procedure Laterality Date  . Knee sx bilateral    . Tailor bunion removal    . Hammer toe surgery    . Open anterior shoulder reconstruction    . Esophagogastroduodenoscopy  10/14/2004    reports that she quit smoking about 25 years ago. Her smoking use included Cigarettes. She has a 20 pack-year smoking history. She does not have any smokeless tobacco history on file. She reports that  drinks alcohol. She reports that she does not use illicit drugs. family history includes Asthma in her other; Diabetes in her other; Esophageal cancer in her father; Melanoma in  her sister; and Ovarian cancer in her other. Allergies  Allergen Reactions  . Amlodipine Besy-Benazepril Hcl     REACTION: Hives  . Penicillins     REACTION: hives  . Pseudoephedrine-Codeine     REACTION: hives   Current Outpatient Prescriptions on File Prior to Visit  Medication Sig Dispense Refill  . albuterol (PROAIR HFA) 108 (90 BASE) MCG/ACT inhaler Inhale 2 puffs into the lungs 4 (four) times daily as needed.  3 Inhaler  3  . aspirin 81 MG EC tablet Take 81 mg by mouth daily.        . calcium acetate (PHOSLO) 667 MG capsule TAKE 1 CAPSULE (667 MG TOTAL) BY MOUTH DAILY.  90 capsule  0  . Glucosamine-Chondroit-Vit C-Mn (GLUCOSAMINE 1500 COMPLEX PO) Take by mouth 2 (two) times daily.        No current facility-administered medications on file prior to visit.   Review of Systems Constitutional: Negative for diaphoresis, activity change, appetite change or unexpected weight change.  HENT: Negative for hearing loss, ear pain, facial swelling, mouth sores and neck stiffness.   Eyes: Negative for pain, redness and visual disturbance.  Respiratory: Negative for shortness of breath and wheezing.   Cardiovascular: Negative for chest pain and palpitations.  Gastrointestinal: Negative for diarrhea, blood in stool, abdominal distention or other pain Genitourinary: Negative for hematuria, flank pain or change in urine volume.  Musculoskeletal: Negative for myalgias and joint swelling.  Skin: Negative for color change and wound.  Neurological: Negative for syncope and numbness. other than noted Hematological: Negative for adenopathy.  Psychiatric/Behavioral: Negative for  hallucinations, self-injury, decreased concentration and agitation.      Objective:   Physical Exam BP 118/70  Pulse 70  Temp(Src) 98.5 F (36.9 C) (Oral)  Ht 5\' 7"  (1.702 m)  Wt 246 lb (111.585 kg)  BMI 38.52 kg/m2  SpO2 98% VS noted,  Constitutional: Pt is oriented to person, place, and time. Appears  well-developed and well-nourished.  Head: Normocephalic and atraumatic.  Right Ear: External ear normal.  Left Ear: External ear normal.  Nose: Nose normal.  Mouth/Throat: Oropharynx is clear and moist.  Eyes: Conjunctivae and EOM are normal. Pupils are equal, round, and reactive to light.  Neck: Normal range of motion. Neck supple. No JVD present. No tracheal deviation present.  Cardiovascular: Normal rate, regular rhythm, normal heart sounds and intact distal pulses.   Pulmonary/Chest: Effort normal and breath sounds normal.  Abdominal: Soft. Bowel sounds are normal. There is no tenderness. No HSM  Musculoskeletal: Normal range of motion. Exhibits no edema.  Lymphadenopathy:  Has no cervical adenopathy.  Neurological: Pt is alert and oriented to person, place, and time. Pt has normal reflexes. No cranial nerve deficit.  Skin: Skin is warm and dry. No rash noted.  Psychiatric:  Has  normal mood and affect. Behavior is normal.     Assessment & Plan:

## 2013-01-21 NOTE — Assessment & Plan Note (Signed)
stable overall by history and exam, recent data reviewed with pt, and pt to continue medical treatment as before,  to f/u any worsening symptoms or concerns Lab Results  Component Value Date   HGBA1C 6.2 01/19/2013

## 2013-01-21 NOTE — Assessment & Plan Note (Signed)
stable overall by history and exam, recent data reviewed with pt, and pt to continue medical treatment as before,  to f/u any worsening symptoms or concerns BP Readings from Last 3 Encounters:  01/21/13 118/70  07/13/12 132/84  01/12/12 122/80

## 2013-01-21 NOTE — Patient Instructions (Addendum)
Your CPAP is set at 11 cm Send int he card so we can look at download Trial of nasal pillows

## 2013-01-21 NOTE — Assessment & Plan Note (Signed)
Your CPAP is set at 11 cm chk download - esp for pressure chk & mouth leak Trial of nasal pillows  Weight loss encouraged, compliance with goal of at least 4-6 hrs every night is the expectation. Advised against medications with sedative side effects Cautioned against driving when sleepy - understanding that sleepiness will vary on a day to day basis

## 2013-01-21 NOTE — Assessment & Plan Note (Signed)
Chronic right - for ent referral

## 2013-01-21 NOTE — Assessment & Plan Note (Signed)
Mild , intermittent  Albuterol prn Ct singulair

## 2013-01-21 NOTE — Progress Notes (Signed)
  Subjective:    Patient ID: Tanya [name], female    DOB: 17-Dec-1955, 56 y.o.   MRN: [id number]  HPI 56/F ex smoker for fu of obstructive sleep apnea .  She moved from [place] to [place] in2000. Due to excessive daytime somnolence & loud snoring she had a sleep study done in [place] in 1997/98 @ [facility] (not available) , was told she had severe obstructive sleep apnea & set up on nasal CPAP ? 5 cm- supplier is [facility].    Asthma was diagnosed as a child , she grew out of this as an adult & is now back to using pro-air , 2 cannisters/ year. This may neeed an evaluation in the future.  8/13-not been seen  for 2 years. The patient has a CPAP machine that is at least 55 years old, but has not been able to get new filters or supplies. She then went back to a machine that was 55 years old, but it is now making a noise and not working properly. The patient uses a nasal CPAP mask Her current CPAP pressure is 11 cm. She feels that she is resting well at night, and is satisfied with her daytime alertness. She was supplied with new CPAP Her weight is down 11 pounds over the last 2 years. Bedtime is 8-9 pm, latency 20 mins, several awakenings, sleeps on 2 pillows on her left side, wakes up with her husband at 4.40 am without headaches or a dry mouth, denies napping but c/o fatigue. Epworth Sleepiness Score is 5/24.  She uses a mouth guard for bruxism, c/o dry mouth in am  Review of Systems neg for any significant sore throat, dysphagia, itching, sneezing, nasal congestion or excess/ purulent secretions, fever, chills, sweats, unintended wt loss, pleuritic or exertional cp, hempoptysis, orthopnea pnd or change in chronic leg swelling. Also denies presyncope, palpitations, heartburn, abdominal pain, nausea, vomiting, diarrhea or change in bowel or urinary habits, dysuria,hematuria, rash, arthralgias, visual complaints, headache, numbness weakness or ataxia.     Objective:   Physical Exam  Gen.  Pleasant, obese, in no distress ENT - no lesions, no post nasal drip Neck: No JVD, no thyromegaly, no carotid bruits Lungs: no use of accessory muscles, no dullness to percussion, decreased without rales or rhonchi  Cardiovascular: Rhythm regular, heart sounds  normal, no murmurs or gallops, no peripheral edema Musculoskeletal: No deformities, no cyanosis or clubbing , no tremors        Assessment & Plan:

## 2013-01-21 NOTE — Patient Instructions (Addendum)
Your EKG was OK today You are given the lab test results Please continue your efforts at being more active, low cholesterol diet, and weight control. Please continue all other medications as before, and refills have been done if requested. Please remember to followup with your GYN for the yearly pap smear and/or mammogram as you do Please have the pharmacy call with any other refills you may need. You are otherwise up to date with prevention measures today. You will be contacted regarding the referral for: ENT Thank you for enrolling in MyChart. Please follow the instructions below to securely access your online medical record. MyChart allows you to send messages to your doctor, view your test results, renew your prescriptions, schedule appointments, and more. To Log into My Chart online, please go by [employer] or [employer] to [email or web address][email or web address], or download the MyChart App from the [employer] of [employer] .  Your Username is: [email or web address] (pass [phone]) Please send a practice Message on Mychart later today. Please return in 1 year for your yearly visit, or sooner if needed, with Lab testing done 3-5 days before

## 2013-01-21 NOTE — Assessment & Plan Note (Signed)

## 2013-01-31 ENCOUNTER — Telehealth: Payer: Self-pay | Admitting: Pulmonary Disease

## 2013-01-31 NOTE — Telephone Encounter (Signed)
Download on auto 5-12 3/14 - good usage avg pr 13 cm, no residual events Can leave on current settings or change to set pt 13 cm if she is willing

## 2013-02-03 NOTE — Telephone Encounter (Signed)
I spoke with patient about results and she verbalized understanding and had no questions. Pt stated she will stay on auto for now.

## 2013-03-21 ENCOUNTER — Telehealth: Payer: Self-pay | Admitting: Pulmonary Disease

## 2013-03-21 DIAGNOSIS — G4733 Obstructive sleep apnea (adult) (pediatric): Secondary | ICD-10-CM

## 2013-03-21 NOTE — Telephone Encounter (Signed)
lmomtcb x1 

## 2013-03-22 NOTE — Telephone Encounter (Signed)
Spoke with pt and she states that at last ov Dr Vassie Loll had advised for pt to try nasal pillows and possibly a new cpap mask to try and eliminate nasal and mouth dryness.  Pt informed that oder was sent to [facility] pt for nasal pillows and mask of choice.

## 2013-04-08 ENCOUNTER — Other Ambulatory Visit: Payer: Self-pay | Admitting: Internal Medicine

## 2013-04-16 ENCOUNTER — Other Ambulatory Visit: Payer: Self-pay | Admitting: Internal Medicine

## 2013-04-22 ENCOUNTER — Telehealth: Payer: Self-pay

## 2013-04-22 NOTE — Telephone Encounter (Signed)
Patient calls stating she just had a physical with Dr Jonny Ruiz in February of this year. She went to pick up her Diovan prescription and on the bottle it states she needs an office visit. I verified with the pharmacist that was the old prescription that was refilled. She does have a current prescription that was sent over on 04/16/13 #90 plus 2 refills.

## 2013-06-08 ENCOUNTER — Other Ambulatory Visit: Payer: Self-pay | Admitting: Internal Medicine

## 2013-08-04 ENCOUNTER — Other Ambulatory Visit: Payer: Self-pay | Admitting: Internal Medicine

## 2013-08-04 MED ORDER — TRAMADOL HCL ER 200 MG PO TB24: ORAL_TABLET | ORAL | Status: DC

## 2013-08-04 NOTE — Telephone Encounter (Signed)
Done hardcopy to robin  I recall it did not print off before unless the print button was pushed, apparently only for this med - ultram er

## 2013-08-04 NOTE — Telephone Encounter (Signed)
Did not receive hardcopy 

## 2013-08-04 NOTE — Telephone Encounter (Signed)
Done hardcopy to robin  

## 2013-08-04 NOTE — Telephone Encounter (Signed)
Faxed hardcopy to [facility] [place]

## 2013-11-29 ENCOUNTER — Telehealth: Payer: Self-pay

## 2013-11-29 NOTE — Telephone Encounter (Signed)
Paper work faxed to [phone], pt is aware.

## 2013-11-29 NOTE — Telephone Encounter (Signed)
The patient called and is hoping to get clarification on what paperwork she needs to complete for her total knee replacement.    Callback - [phone]

## 2013-12-02 ENCOUNTER — Other Ambulatory Visit: Payer: Self-pay | Admitting: Orthopedic Surgery

## 2013-12-07 ENCOUNTER — Ambulatory Visit: Payer: Managed Care, Other (non HMO) | Admitting: Internal Medicine

## 2013-12-20 ENCOUNTER — Other Ambulatory Visit: Payer: Self-pay | Admitting: Orthopedic Surgery

## 2013-12-20 NOTE — H&P (Signed)
Tanya [name] DOB: 04-27-1956 Married / Language: English / Race: White Female  H&P date: 12/20/13  Chief Complaint: Left knee pain  History of Present Illness The patient is a 58 year old female who comes in today for a preoperative History and Physical. The patient is scheduled for a left total knee arthroplasty to be performed by Dr. Johnn Hai, MD at [facility] on 12/29/13. The patient is a 58 year old female being followed for their left knee pain. She notes many years of knee pain, which was previously tx by Dr. Tonita Cong. She underwent a knee scope on the left by Dr. Tonita Cong in 2006 with grade 3 and 4 changes at that time. Since then she has had progressively worsening symptoms, steroid injections for flareups, has been using a hinged knee brace. She reports worsening symptoms in December after carrying a computer. The knee does sometimes feel unstable and gives out on her, the brace does help it feel more stable. She notes popping, clicking, crunching, catching, and swelling. Even before her reinjury her pain was severe enough that it is interfering with ADLs, and is now worse. She saw Dr. Gladstone Lighter on 12/22 and was given a steroid injection as well as a prednisone dosepak. Neither seemed to help. She has been taking Norco with relief. At this point she is interested in other options. She is unaware of her bone density status. She typically follows up with her PCP early in the year for her physical. Denies DVT or MRSA. PCN allergy - hives.  We mutually agreed to proceed with a total knee replacement. Risks and benefits of the procedure were discussed including stiffness, suboptimal range of motion, persistent pain, infection requiring removal of prosthesis and reinsertion, need for prophylactic antibiotics in the future, for example, dental procedures, possible need for manipulation, revision in the future and also anesthetic complications including DVT, PE, etc. We discussed  the perioperative course, time in the hospital, postoperative recovery and the need for elevation to control swelling. We also discussed the predicted range of motion and the probability that squatting and kneeling would be unobtainable in the future. In addition, postoperative anticoagulation was discussed. We have obtained preoperative medical clearance as necessary- Dr. Cathlean Cower. Provided her illustrated handout and discussed it in detail. They will enroll in the total joint replacement educational forum at the hospital.  Past Medical Hx Sleep Apnea. uses CPAP Skin Cancer Asthma Osteoarthritis High blood pressure Diverticulosis. 1995 Varicose veins Anemia. no transfusions Measles Mumps  Allergies Amoxicillin *PENICILLINS*. hives Lotrel *ANTIHYPERTENSIVES*. hives Nidofed. 04/26/2002 Pseudoephedrine DX *COUGH/COLD/ALLERGY*. hives  Family History Diabetes Mellitus. First Degree Relatives. mother Cancer. Mother, Father, Sister. mother; father- esophageal CA; sisters- brain tumor, ovarian CA, mouth CA, skin CA; grandmother mothers side  Social History Tobacco use. Former smoker. former smoker; smoke(d) 1 pack(s) per day, quit in 1995 Drug/Alcohol Rehab (Previously). no Drug/Alcohol Rehab (Currently). no Illicit drug use. no Exercise. Exercises daily; does other Current work status. working full timeScientist, research (physical sciences). 0 Alcohol use. current drinker; drinks beer, wine and hard liquor; less than 5 per week Tobacco / smoke exposure. yes outdoors only Marital status. married Living situation. live with spouse in 2 story home, able to sleep on 1st floor post-op, 7 steps to enter home Pain Contract. no Number of flights of stairs before winded. 2-3 Post-Surgical Plans. 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 living will, HPOA  Medication History AmLODIPine Besylate (10MG  Tablet, Oral) Active. Celecoxib (200MG  Capsule,  Oral) Active. Montelukast Sodium (10MG  Tablet, Oral) Active. TraMADol HCl ER (200MG  Tablet ER 24HR, Oral) Active. Valsartan (160MG  Tablet, Oral) Active. Calcium Acetate (667MG  Tablet, Oral) Active. Albuterol Sulfate HFA (108 (90 Base)MCG/ACT Aerosol Soln, Inhalation) Active. Aspirin EC (81MG  Tablet DR, Oral) Active. Glucosamine HCl (1500MG  Tablet, Oral) Active. Medications Reconciled.  Pregnancy / Birth History Pregnant. no  Past Surgical History Colon Polyp Removal - Colonoscopy Arthroscopy of Knee. bilateral Foot Surgery. bilateral Dilation and Curettage of Uterus Rotator Cuff Repair. right x2 Tonsillectomy  Review of Systems(Jaclyn Deatra Robinson, PA-C; 12/20/2013 4:18 PM) General:Not Present- Chills, Fever, Night Sweats, Fatigue, Weight Gain, Weight Loss and Memory Loss. Skin:Not Present- Hives, Itching, Rash, Eczema and Lesions. HEENT:Not Present- Tinnitus, Headache, Double Vision, Visual Loss, Hearing Loss and Dentures. Respiratory:Not Present- Shortness of breath with exertion, Shortness of breath at rest, Allergies, Coughing up blood and Chronic Cough. Cardiovascular:Not Present- Chest Pain, Racing/skipping heartbeats, Difficulty Breathing Lying Down, Murmur, Swelling and Palpitations. Gastrointestinal:Not Present- Bloody Stool, Heartburn, Abdominal Pain, Vomiting, Nausea, Constipation, Diarrhea, Difficulty Swallowing, Jaundice and Loss of appetitie. Female Genitourinary:Present- Urinary frequency. Not Present- Blood in Urine, Weak urinary stream, Discharge, Flank Pain, Incontinence, Painful Urination, Urgency, Urinary Retention and Urinating at Night. Musculoskeletal:Present- Joint Pain. Not Present- Muscle Weakness, Muscle Pain, Joint Swelling, Back Pain, Morning Stiffness and Spasms. Neurological:Not Present- Tremor, Dizziness, Blackout spells, Paralysis, Difficulty with balance and Weakness. Psychiatric:Not Present- Insomnia.  Vitals 12/20/2013 2:25 PM Weight:  247 lb Height: 67 in Weight was reported by patient. Height was reported by patient. Body Surface Area: 2.3 m Body Mass Index: 38.69 kg/m BP: 136/82 (Sitting, Left Arm, Standard)  Physical Exam The physical exam findings are as follows:  General Mental Status - Alert, cooperative and good historian. General Appearance- pleasant. Not in acute distress. Orientation- Oriented X3. Build & Nutrition- Well nourished and Well developed. Gait- Stiff and Antalgic.  Head and Neck Head- normocephalic, atraumatic . Neck Global Assessment- supple. no bruit auscultated on the right and no bruit auscultated on the left.  Eye Pupil- Bilateral- Regular and Round. Motion- Bilateral- EOMI.  Chest and Lung Exam Auscultation: Breath sounds:- clear at anterior chest wall and - clear at posterior chest wall. Adventitious sounds:- No Adventitious sounds.  Cardiovascular Auscultation:Rhythm- Regular rate and rhythm. Heart Sounds- S1 WNL and S2 WNL. Murmurs & Other Heart Sounds:Auscultation of the heart reveals - No Murmurs.  Abdomen Palpation/Percussion:Tenderness- Abdomen is non-tender to palpation. Rigidity (guarding)- Abdomen is soft. Auscultation:Auscultation of the abdomen reveals - Bowel sounds normal.  Female Genitourinary Not done, not pertinent to present illness  Musculoskeletal Left Lower Extremity: Left Knee: Inspection and Palpation: Tenderness - medial joint line tender to palpation and lateral joint line tender to palpation. no tenderness to palpation of the superior calf, no tenderness to palpation of the pes anserine bursa, no tenderness to palpation of the quadriceps tendon, no tenderness to palpation of the patellar tendon, no tenderness to palpation of the patella, no tenderness to palpation of the fibular head and no tenderness to palpation of the peroneal nerve. Patellar Tendon - no pain to palpation of the patellar tendon. Swelling -  periarticular swelling present. Effusion - trace. Tissue tension/texture is - soft. Pulses - 2+. Sensation - intact to light touch. Alignment: Tibiofemoral Alignment - varus alignment. Skin - Color - no ecchymosis and no erythema. Strength and Tone: Quadriceps - 5/5. Hamstrings - 5/5. ROM: Flexion: AROM - 100 . Extension: AROM - 5 . Stability - Valgus Laxity at 30 - None. Valgus Laxity at 0 -  None. Varus Laxity at 30 - None. Varus Laxity at 0 - None. Lachman - Negative. Anterior Drawer Test - Negative. Posterior Drawer Test - Negative. Deformities/Malalignments/Discrepancies - no deformities noted. Special Tests: McMurray Test (lateral) - negative. McMurray Test (medial) - negative. Patellar Compression Pain - mild pain.  Imaging xrays from 10/2013 with severe B/L knee DJD, end-stage, with varus deformity. Left knee more severe than right with tricompartmental end-stage bone-on-bone arthrosis. Large PF osteophytes noted.  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 She is scheduled for L total knee replacement by Dr. Tonita Cong on 12/29/13. She has been cleared by her PCP Dr. Cathlean Cower. We again discussed the procedure itself as well as risks, complications, and alternatives, including but not limited to DVT, PE, infx, bleeding, failure of procedure, need for secondary procedure, anesthesia risk, even death. We discussed post-op protocols, need for PT, time out of work, DVT and dental ppx, ice and elevation. Reviewed flexion and extension exercises. All questions were answered. She will need specific work papers filled out when ready to return to work. She has left them in my possession for when that time comes. She still has pain medication to  take prn in the interim and her knee brace for support. Will proceed as scheduled with left total knee replacement. She will follow up 10-14 days post-op for suture removal and xrays. She will call with any questions or concerns in the interim.  Plan left total knee replacement  Signed electronically by Cecilie Kicks, PA-C on 12/20/2013 for Dr. Tonita Cong

## 2013-12-22 ENCOUNTER — Encounter (HOSPITAL_COMMUNITY): Payer: Self-pay | Admitting: Pharmacy Technician

## 2013-12-23 ENCOUNTER — Ambulatory Visit (HOSPITAL_COMMUNITY)
Admission: RE | Admit: 2013-12-23 | Discharge: 2013-12-23 | Disposition: A | Discharge status: Home / Self Care | Payer: Managed Care, Other (non HMO) | Source: Ambulatory Visit | Attending: Orthopedic Surgery | Admitting: Orthopedic Surgery

## 2013-12-23 ENCOUNTER — Other Ambulatory Visit: Payer: Self-pay

## 2013-12-23 ENCOUNTER — Encounter (HOSPITAL_COMMUNITY)
Admission: RE | Admit: 2013-12-23 | Discharge: 2013-12-23 | Disposition: A | Discharge status: Home / Self Care | Payer: Managed Care, Other (non HMO) | Source: Ambulatory Visit | Attending: Specialist | Admitting: Specialist

## 2013-12-23 ENCOUNTER — Encounter (HOSPITAL_COMMUNITY): Payer: Self-pay

## 2013-12-23 ENCOUNTER — Ambulatory Visit (HOSPITAL_COMMUNITY)
Admission: RE | Admit: 2013-12-23 | Discharge: 2013-12-23 | Disposition: A | Discharge status: Home / Self Care | Payer: Managed Care, Other (non HMO) | Source: Ambulatory Visit | Attending: Specialist | Admitting: Specialist

## 2013-12-23 DIAGNOSIS — M1710 Unilateral primary osteoarthritis, unspecified knee: Secondary | ICD-10-CM | POA: Insufficient documentation

## 2013-12-23 DIAGNOSIS — Z01810 Encounter for preprocedural cardiovascular examination: Secondary | ICD-10-CM | POA: Insufficient documentation

## 2013-12-23 DIAGNOSIS — Z01818 Encounter for other preprocedural examination: Secondary | ICD-10-CM | POA: Insufficient documentation

## 2013-12-23 DIAGNOSIS — Z01812 Encounter for preprocedural laboratory examination: Secondary | ICD-10-CM | POA: Insufficient documentation

## 2013-12-23 DIAGNOSIS — IMO0002 Reserved for concepts with insufficient information to code with codable children: Secondary | ICD-10-CM | POA: Insufficient documentation

## 2013-12-23 LAB — ABO/RH: ABO/RH(D): O POS

## 2013-12-23 LAB — URINALYSIS, ROUTINE W REFLEX MICROSCOPIC
Bilirubin Urine: NEGATIVE
Glucose, UA: NEGATIVE mg/dL
Hgb urine dipstick: NEGATIVE
Ketones, ur: NEGATIVE mg/dL
Leukocytes, UA: NEGATIVE
Nitrite: NEGATIVE
Protein, ur: NEGATIVE mg/dL
Specific Gravity, Urine: 1.019 (ref 1.005–1.030)
Urobilinogen, UA: 0.2 mg/dL (ref 0.0–1.0)
pH: 7.5 (ref 5.0–8.0)

## 2013-12-23 LAB — BASIC METABOLIC PANEL
BUN: 13 mg/dL (ref 6–23)
CHLORIDE: 103 meq/L (ref 96–112)
CO2: 26 meq/L (ref 19–32)
Calcium: 9.3 mg/dL (ref 8.4–10.5)
Creatinine, Ser: 0.72 mg/dL (ref 0.50–1.10)
GFR calc Af Amer: >90 mL/min (ref >90)
GFR calc non Af Amer: >90 mL/min (ref >90)
GLUCOSE: 136 mg/dL — AB (ref 70–99)
POTASSIUM: 4.7 meq/L (ref 3.7–5.3)
Sodium: 140 mEq/L (ref 137–147)

## 2013-12-23 LAB — SURGICAL PCR SCREEN
MRSA, PCR: NEGATIVE
Staphylococcus aureus: NEGATIVE

## 2013-12-23 LAB — CBC
HEMATOCRIT: 42.2 % (ref 36.0–46.0)
Hemoglobin: 14.1 g/dL (ref 12.0–15.0)
MCH: 30.1 pg (ref 26.0–34.0)
MCHC: 33.4 g/dL (ref 30.0–36.0)
MCV: 90.2 fL (ref 78.0–100.0)
Platelets: 174 10*3/uL (ref 150–400)
RBC: 4.68 MIL/uL (ref 3.87–5.11)
RDW: 13.3 % (ref 11.5–15.5)
WBC: 5.8 10*3/uL (ref 4.0–10.5)

## 2013-12-23 LAB — PROTIME-INR
INR: 0.97 (ref 0.00–1.49)
Prothrombin Time: 12.7 s (ref 11.6–15.2)

## 2013-12-23 NOTE — Patient Instructions (Addendum)
Tanya [name]  12/23/2013   Your procedure is scheduled on: 12/29/13  Report to [facility] at  7:15 AM   Call this number if you have problems the morning of surgery: [phone]   Due to [employer] flu policy no visitors under age 56 at this time   Remember:   Do not eat food or drink liquids after midnight.   Take these medicines the morning of surgery with A SIP OF WATER:   Do not wear jewelry, make-up or nail polish.  Do not wear lotions, powders, or perfumes. You may wear deodorant.  Do not shave 48  hours prior to surgery. Men may shave face and neck.  Do not bring valuables to the hospital.  [facility] is not responsible for any belongings or valuables.                 Contacts, dentures or bridgework may not be worn into surgery.  Leave suitcase in the car. After surgery it may be brought to your room.  For patients admitted to the hospital, checkout time is 11:00 AM the day of discharge  .   Patients discharged the day of surgery will not be allowed to drive  home.  Name and phone number of your driver:   Special Instructions:   Shower using CHG 2 nights before surgery and the night before surgery.  If you shower the day of surgery use CHG.  Use special wash - you have one bottle of CHG for all showers.  You should use approximately 1/3 of the bottle for each shower.   Please read over the following fact sheets that you were given: MRSA Information       Questions please call  Karie Chimera RN     [phone]    Patient signature _______________________________________  Nurse signature ________________________________________

## 2013-12-27 ENCOUNTER — Other Ambulatory Visit: Payer: Self-pay | Admitting: Internal Medicine

## 2013-12-29 ENCOUNTER — Encounter (HOSPITAL_COMMUNITY): Payer: Self-pay | Admitting: *Deleted

## 2013-12-29 ENCOUNTER — Encounter (HOSPITAL_COMMUNITY): Payer: Managed Care, Other (non HMO) | Admitting: Anesthesiology

## 2013-12-29 ENCOUNTER — Inpatient Hospital Stay (HOSPITAL_COMMUNITY): Payer: Managed Care, Other (non HMO) | Admitting: Anesthesiology

## 2013-12-29 ENCOUNTER — Inpatient Hospital Stay (HOSPITAL_COMMUNITY)
Admission: RE | Admit: 2013-12-29 | Discharge: 2013-12-31 | DRG: 470 | Disposition: A | Discharge status: Home Health | Payer: Managed Care, Other (non HMO) | Source: Ambulatory Visit | Attending: Specialist | Admitting: Specialist

## 2013-12-29 ENCOUNTER — Encounter (HOSPITAL_COMMUNITY)
Admission: RE | Disposition: A | Discharge status: Home Health | Payer: Self-pay | Source: Ambulatory Visit | Attending: Specialist

## 2013-12-29 DIAGNOSIS — I10 Essential (primary) hypertension: Secondary | ICD-10-CM | POA: Diagnosis present

## 2013-12-29 DIAGNOSIS — Z833 Family history of diabetes mellitus: Secondary | ICD-10-CM

## 2013-12-29 DIAGNOSIS — Z6838 Body mass index (BMI) 38.0-38.9, adult: Secondary | ICD-10-CM

## 2013-12-29 DIAGNOSIS — Z880 Allergy status to penicillin: Secondary | ICD-10-CM

## 2013-12-29 DIAGNOSIS — M1712 Unilateral primary osteoarthritis, left knee: Secondary | ICD-10-CM

## 2013-12-29 DIAGNOSIS — M179 Osteoarthritis of knee, unspecified: Secondary | ICD-10-CM

## 2013-12-29 DIAGNOSIS — Z860100 Personal history of colon polyps, unspecified: Secondary | ICD-10-CM

## 2013-12-29 DIAGNOSIS — G4733 Obstructive sleep apnea (adult) (pediatric): Secondary | ICD-10-CM | POA: Diagnosis present

## 2013-12-29 DIAGNOSIS — M1710 Unilateral primary osteoarthritis, unspecified knee: Principal | ICD-10-CM | POA: Diagnosis present

## 2013-12-29 DIAGNOSIS — J45909 Unspecified asthma, uncomplicated: Secondary | ICD-10-CM | POA: Diagnosis present

## 2013-12-29 DIAGNOSIS — Z87891 Personal history of nicotine dependence: Secondary | ICD-10-CM

## 2013-12-29 HISTORY — PX: TOTAL KNEE ARTHROPLASTY: SHX125

## 2013-12-29 LAB — TYPE AND SCREEN
ABO/RH(D): O POS
ANTIBODY SCREEN: NEGATIVE

## 2013-12-29 SURGERY — ARTHROPLASTY, KNEE, TOTAL
Anesthesia: General | Site: Knee | Laterality: Left

## 2013-12-29 MED ORDER — SODIUM CHLORIDE 0.9 % IJ SOLN
INTRAMUSCULAR | Status: AC
  Filled 2013-12-29: qty 50

## 2013-12-29 MED ORDER — METOCLOPRAMIDE HCL 5 MG/ML IJ SOLN
INTRAMUSCULAR | Status: AC
  Filled 2013-12-29: qty 2

## 2013-12-29 MED ORDER — METHOCARBAMOL 500 MG PO TABS
500.0000 mg | ORAL_TABLET | Freq: Four times a day (QID) | ORAL | Status: DC | PRN
  Administered 2013-12-29 – 2013-12-31 (×5): 500 mg via ORAL
  Filled 2013-12-29 (×5): qty 1

## 2013-12-29 MED ORDER — BUPIVACAINE-EPINEPHRINE PF 0.5-1:200000 % IJ SOLN
INTRAMUSCULAR | Status: AC
  Filled 2013-12-29: qty 30

## 2013-12-29 MED ORDER — METOCLOPRAMIDE HCL 5 MG/ML IJ SOLN
INTRAMUSCULAR | Status: DC | PRN
  Administered 2013-12-29: 10 mg via INTRAVENOUS

## 2013-12-29 MED ORDER — CALCIUM ACETATE 667 MG PO CAPS
667.0000 mg | ORAL_CAPSULE | Freq: Every morning | ORAL | Status: DC
  Administered 2013-12-30 – 2013-12-31 (×2): 667 mg via ORAL
  Filled 2013-12-29 (×2): qty 1

## 2013-12-29 MED ORDER — ONDANSETRON HCL 4 MG/2ML IJ SOLN
INTRAMUSCULAR | Status: DC | PRN
  Administered 2013-12-29: 4 mg via INTRAVENOUS

## 2013-12-29 MED ORDER — SODIUM CHLORIDE 0.9 % IJ SOLN
INTRAMUSCULAR | Status: AC
  Filled 2013-12-29: qty 10

## 2013-12-29 MED ORDER — PROPOFOL 10 MG/ML IV BOLUS
INTRAVENOUS | Status: DC | PRN
  Administered 2013-12-29: 200 mg via INTRAVENOUS

## 2013-12-29 MED ORDER — DEXAMETHASONE SODIUM PHOSPHATE 10 MG/ML IJ SOLN
INTRAMUSCULAR | Status: DC | PRN
  Administered 2013-12-29: 10 mg via INTRAVENOUS

## 2013-12-29 MED ORDER — LORATADINE 10 MG PO TABS
10.0000 mg | ORAL_TABLET | Freq: Every day | ORAL | Status: DC
  Administered 2013-12-30 – 2013-12-31 (×2): 10 mg via ORAL
  Filled 2013-12-29 (×2): qty 1

## 2013-12-29 MED ORDER — DOCUSATE SODIUM 100 MG PO CAPS
100.0000 mg | ORAL_CAPSULE | Freq: Two times a day (BID) | ORAL | Status: DC
  Administered 2013-12-29 – 2013-12-31 (×4): 100 mg via ORAL

## 2013-12-29 MED ORDER — TRAMADOL HCL ER 200 MG PO TB24: 200.0000 mg | ORAL_TABLET | Freq: Every morning | ORAL | Status: DC

## 2013-12-29 MED ORDER — RIVAROXABAN 10 MG PO TABS
10.0000 mg | ORAL_TABLET | Freq: Every day | ORAL | Status: DC
  Administered 2013-12-30 – 2013-12-31 (×2): 10 mg via ORAL
  Filled 2013-12-29 (×3): qty 1

## 2013-12-29 MED ORDER — MENTHOL 3 MG MT LOZG
1.0000 | LOZENGE | OROMUCOSAL | Status: DC | PRN
  Filled 2013-12-29: qty 9

## 2013-12-29 MED ORDER — SUFENTANIL CITRATE 50 MCG/ML IV SOLN
INTRAVENOUS | Status: DC | PRN
  Administered 2013-12-29 (×2): 10 ug via INTRAVENOUS
  Administered 2013-12-29: 20 ug via INTRAVENOUS
  Administered 2013-12-29: 10 ug via INTRAVENOUS

## 2013-12-29 MED ORDER — ONDANSETRON HCL 4 MG/2ML IJ SOLN
INTRAMUSCULAR | Status: AC
  Filled 2013-12-29: qty 2

## 2013-12-29 MED ORDER — LACTATED RINGERS IV SOLN
INTRAVENOUS | Status: DC
  Administered 2013-12-29 (×3): via INTRAVENOUS

## 2013-12-29 MED ORDER — CEFAZOLIN SODIUM-DEXTROSE 2-3 GM-% IV SOLR
INTRAVENOUS | Status: AC
  Filled 2013-12-29: qty 50

## 2013-12-29 MED ORDER — METHOCARBAMOL 500 MG PO TABS: 500.0000 mg | ORAL_TABLET | Freq: Three times a day (TID) | ORAL | Status: DC | PRN

## 2013-12-29 MED ORDER — ACETAMINOPHEN 650 MG RE SUPP: 650.0000 mg | Freq: Four times a day (QID) | RECTAL | Status: DC | PRN

## 2013-12-29 MED ORDER — LIDOCAINE HCL (CARDIAC) 20 MG/ML IV SOLN
INTRAVENOUS | Status: AC
  Filled 2013-12-29: qty 5

## 2013-12-29 MED ORDER — HYDROMORPHONE HCL PF 2 MG/ML IJ SOLN
INTRAMUSCULAR | Status: AC
  Filled 2013-12-29: qty 1

## 2013-12-29 MED ORDER — AMLODIPINE BESYLATE 10 MG PO TABS
10.0000 mg | ORAL_TABLET | Freq: Every morning | ORAL | Status: DC
  Administered 2013-12-30 – 2013-12-31 (×2): 10 mg via ORAL
  Filled 2013-12-29 (×2): qty 1

## 2013-12-29 MED ORDER — PROPOFOL 10 MG/ML IV BOLUS
INTRAVENOUS | Status: AC
  Filled 2013-12-29: qty 20

## 2013-12-29 MED ORDER — DEXAMETHASONE SODIUM PHOSPHATE 10 MG/ML IJ SOLN
INTRAMUSCULAR | Status: AC
  Filled 2013-12-29: qty 1

## 2013-12-29 MED ORDER — ALBUTEROL SULFATE HFA 108 (90 BASE) MCG/ACT IN AERS: 2.0000 | INHALATION_SPRAY | Freq: Four times a day (QID) | RESPIRATORY_TRACT | Status: DC | PRN

## 2013-12-29 MED ORDER — BUPIVACAINE LIPOSOME 1.3 % IJ SUSP
20.0000 mL | Freq: Once | INTRAMUSCULAR | Status: AC
  Administered 2013-12-29: 20 mL
  Filled 2013-12-29: qty 20

## 2013-12-29 MED ORDER — SODIUM CHLORIDE 0.9 % IR SOLN
Status: DC | PRN
  Administered 2013-12-29: 10:00:00

## 2013-12-29 MED ORDER — HYDROMORPHONE HCL PF 1 MG/ML IJ SOLN
INTRAMUSCULAR | Status: AC
  Filled 2013-12-29: qty 1

## 2013-12-29 MED ORDER — SODIUM CHLORIDE 0.45 % IV SOLN
INTRAVENOUS | Status: AC
  Administered 2013-12-29: 18:00:00 via INTRAVENOUS

## 2013-12-29 MED ORDER — ACETAMINOPHEN 325 MG PO TABS: 650.0000 mg | ORAL_TABLET | Freq: Four times a day (QID) | ORAL | Status: DC | PRN

## 2013-12-29 MED ORDER — EPHEDRINE SULFATE 50 MG/ML IJ SOLN
INTRAMUSCULAR | Status: AC
  Filled 2013-12-29: qty 1

## 2013-12-29 MED ORDER — KETAMINE HCL 10 MG/ML IJ SOLN
INTRAMUSCULAR | Status: AC
  Filled 2013-12-29: qty 1

## 2013-12-29 MED ORDER — ACETAMINOPHEN 10 MG/ML IV SOLN: INTRAVENOUS | Status: DC | PRN

## 2013-12-29 MED ORDER — BUPIVACAINE-EPINEPHRINE (PF) 0.5% -1:200000 IJ SOLN
INTRAMUSCULAR | Status: DC | PRN
  Administered 2013-12-29: 10 mL

## 2013-12-29 MED ORDER — MIDAZOLAM HCL 2 MG/2ML IJ SOLN
INTRAMUSCULAR | Status: AC
  Filled 2013-12-29: qty 2

## 2013-12-29 MED ORDER — MIDAZOLAM HCL 5 MG/5ML IJ SOLN
INTRAMUSCULAR | Status: DC | PRN
  Administered 2013-12-29: 2 mg via INTRAVENOUS

## 2013-12-29 MED ORDER — HYDROMORPHONE HCL PF 1 MG/ML IJ SOLN
1.0000 mg | INTRAMUSCULAR | Status: DC | PRN
  Administered 2013-12-30 – 2013-12-31 (×2): 1 mg via INTRAVENOUS
  Filled 2013-12-29 (×2): qty 1

## 2013-12-29 MED ORDER — METHOCARBAMOL 100 MG/ML IJ SOLN
500.0000 mg | Freq: Four times a day (QID) | INTRAVENOUS | Status: DC | PRN
  Administered 2013-12-29: 500 mg via INTRAVENOUS
  Filled 2013-12-29: qty 5

## 2013-12-29 MED ORDER — CISATRACURIUM BESYLATE (PF) 10 MG/5ML IV SOLN
INTRAVENOUS | Status: DC | PRN
  Administered 2013-12-29: 8 mg via INTRAVENOUS

## 2013-12-29 MED ORDER — DOCUSATE SODIUM 100 MG PO CAPS: 100.0000 mg | ORAL_CAPSULE | Freq: Two times a day (BID) | ORAL | Status: DC

## 2013-12-29 MED ORDER — PROMETHAZINE HCL 25 MG/ML IJ SOLN: 6.2500 mg | INTRAMUSCULAR | Status: DC | PRN

## 2013-12-29 MED ORDER — MONTELUKAST SODIUM 10 MG PO TABS
10.0000 mg | ORAL_TABLET | Freq: Every day | ORAL | Status: DC
  Administered 2013-12-30 (×2): 10 mg via ORAL
  Filled 2013-12-29 (×3): qty 1

## 2013-12-29 MED ORDER — ONDANSETRON HCL 4 MG/2ML IJ SOLN
4.0000 mg | Freq: Four times a day (QID) | INTRAMUSCULAR | Status: DC | PRN
  Administered 2013-12-29: 4 mg via INTRAVENOUS
  Filled 2013-12-29: qty 2

## 2013-12-29 MED ORDER — CALCIUM ACETATE 667 MG PO CAPS: 667.0000 mg | ORAL_CAPSULE | Freq: Every day | ORAL | Status: DC

## 2013-12-29 MED ORDER — ACETAMINOPHEN 10 MG/ML IV SOLN
1000.0000 mg | Freq: Once | INTRAVENOUS | Status: AC
  Administered 2013-12-29: 1000 mg via INTRAVENOUS
  Filled 2013-12-29: qty 100

## 2013-12-29 MED ORDER — ADULT MULTIVITAMIN W/MINERALS CH
1.0000 | ORAL_TABLET | Freq: Every day | ORAL | Status: DC
  Administered 2013-12-30 – 2013-12-31 (×2): 1 via ORAL
  Filled 2013-12-29 (×2): qty 1

## 2013-12-29 MED ORDER — METOCLOPRAMIDE HCL 10 MG PO TABS: 5.0000 mg | ORAL_TABLET | Freq: Three times a day (TID) | ORAL | Status: DC | PRN

## 2013-12-29 MED ORDER — IRBESARTAN 75 MG PO TABS
75.0000 mg | ORAL_TABLET | Freq: Every day | ORAL | Status: DC
  Administered 2013-12-30 – 2013-12-31 (×2): 75 mg via ORAL
  Filled 2013-12-29 (×2): qty 1

## 2013-12-29 MED ORDER — CHLORHEXIDINE GLUCONATE 4 % EX LIQD: 60.0000 mL | Freq: Once | CUTANEOUS | Status: DC

## 2013-12-29 MED ORDER — LIDOCAINE HCL (CARDIAC) 20 MG/ML IV SOLN
INTRAVENOUS | Status: DC | PRN
  Administered 2013-12-29: 100 mg via INTRAVENOUS

## 2013-12-29 MED ORDER — ONDANSETRON HCL 4 MG PO TABS: 4.0000 mg | ORAL_TABLET | Freq: Four times a day (QID) | ORAL | Status: DC | PRN

## 2013-12-29 MED ORDER — OXYCODONE-ACETAMINOPHEN 7.5-325 MG PO TABS: 1.0000 | ORAL_TABLET | ORAL | Status: DC | PRN

## 2013-12-29 MED ORDER — SCOPOLAMINE 1 MG/3DAYS TD PT72
MEDICATED_PATCH | TRANSDERMAL | Status: DC | PRN
  Administered 2013-12-29: 1 via TRANSDERMAL

## 2013-12-29 MED ORDER — LACTATED RINGERS IV SOLN: INTRAVENOUS | Status: DC

## 2013-12-29 MED ORDER — METOCLOPRAMIDE HCL 5 MG/ML IJ SOLN: 5.0000 mg | Freq: Three times a day (TID) | INTRAMUSCULAR | Status: DC | PRN

## 2013-12-29 MED ORDER — HYDROMORPHONE HCL PF 1 MG/ML IJ SOLN
0.2500 mg | INTRAMUSCULAR | Status: DC | PRN
  Administered 2013-12-29 (×2): 0.25 mg via INTRAVENOUS

## 2013-12-29 MED ORDER — SUFENTANIL CITRATE 50 MCG/ML IV SOLN
INTRAVENOUS | Status: AC
  Filled 2013-12-29: qty 1

## 2013-12-29 MED ORDER — CEFAZOLIN SODIUM-DEXTROSE 2-3 GM-% IV SOLR: 2.0000 g | INTRAVENOUS | Status: DC

## 2013-12-29 MED ORDER — HYDROMORPHONE HCL PF 1 MG/ML IJ SOLN
INTRAMUSCULAR | Status: DC | PRN
  Administered 2013-12-29: 0.5 mg via INTRAVENOUS
  Administered 2013-12-29: .4 mg via INTRAVENOUS
  Administered 2013-12-29: 0.5 mg via INTRAVENOUS
  Administered 2013-12-29: .2 mg via INTRAVENOUS
  Administered 2013-12-29: .4 mg via INTRAVENOUS

## 2013-12-29 MED ORDER — PHENOL 1.4 % MT LIQD
1.0000 | OROMUCOSAL | Status: DC | PRN
  Filled 2013-12-29: qty 177

## 2013-12-29 MED ORDER — CEFAZOLIN SODIUM-DEXTROSE 2-3 GM-% IV SOLR
2.0000 g | Freq: Four times a day (QID) | INTRAVENOUS | Status: AC
  Administered 2013-12-29 (×2): 2 g via INTRAVENOUS
  Filled 2013-12-29 (×2): qty 50

## 2013-12-29 MED ORDER — KETAMINE HCL 10 MG/ML IJ SOLN
INTRAMUSCULAR | Status: DC | PRN
  Administered 2013-12-29 (×3): 10 mg via INTRAVENOUS
  Administered 2013-12-29: 30 mg via INTRAVENOUS
  Administered 2013-12-29 (×2): 10 mg via INTRAVENOUS

## 2013-12-29 MED ORDER — SCOPOLAMINE 1 MG/3DAYS TD PT72
MEDICATED_PATCH | TRANSDERMAL | Status: AC
Start: 2013-12-29 — End: 2013-12-29
  Filled 2013-12-29: qty 1

## 2013-12-29 MED ORDER — RIVAROXABAN 10 MG PO TABS: 10.0000 mg | ORAL_TABLET | Freq: Every day | ORAL | Status: DC

## 2013-12-29 MED ORDER — HYDROCODONE-ACETAMINOPHEN 7.5-325 MG PO TABS
1.0000 | ORAL_TABLET | ORAL | Status: DC | PRN
  Administered 2013-12-29 (×2): 1 via ORAL
  Administered 2013-12-30 – 2013-12-31 (×7): 2 via ORAL
  Filled 2013-12-29: qty 2
  Filled 2013-12-29: qty 1
  Filled 2013-12-29 (×6): qty 2
  Filled 2013-12-29: qty 1
  Filled 2013-12-29: qty 2

## 2013-12-29 MED ORDER — SUCCINYLCHOLINE CHLORIDE 20 MG/ML IJ SOLN
INTRAMUSCULAR | Status: DC | PRN
  Administered 2013-12-29: 100 mg via INTRAVENOUS

## 2013-12-29 SURGICAL SUPPLY — 66 items
BAG ZIPLOCK 12X15 (MISCELLANEOUS) ×3 IMPLANT
BANDAGE ELASTIC 4 VELCRO ST LF (GAUZE/BANDAGES/DRESSINGS) ×3 IMPLANT
BANDAGE ELASTIC 6 VELCRO ST LF (GAUZE/BANDAGES/DRESSINGS) ×3 IMPLANT
BANDAGE ESMARK 6X9 LF (GAUZE/BANDAGES/DRESSINGS) ×1 IMPLANT
BLADE SAG 18X100X1.27 (BLADE) ×3 IMPLANT
BLADE SAW SGTL 13.0X1.19X90.0M (BLADE) ×3 IMPLANT
BNDG ESMARK 6X9 LF (GAUZE/BANDAGES/DRESSINGS) ×3
CAPT RP KNEE ×3 IMPLANT
CEMENT HV SMART SET (Cement) ×6 IMPLANT
CHLORAPREP W/TINT 26ML (MISCELLANEOUS) IMPLANT
CLOSURE WOUND 1/2 X4 (GAUZE/BANDAGES/DRESSINGS) ×1
CLOTH 2% CHLOROHEXIDINE 3PK (PERSONAL CARE ITEMS) ×3 IMPLANT
CUFF TOURN SGL QUICK 34 (TOURNIQUET CUFF) ×2
CUFF TRNQT CYL 34X4X40X1 (TOURNIQUET CUFF) ×1 IMPLANT
DECANTER SPIKE VIAL GLASS SM (MISCELLANEOUS) IMPLANT
DRAPE LG THREE QUARTER DISP (DRAPES) ×3 IMPLANT
DRAPE ORTHO SPLIT 77X108 STRL (DRAPES) ×4
DRAPE POUCH INSTRU U-SHP 10X18 (DRAPES) ×3 IMPLANT
DRAPE SURG ORHT 6 SPLT 77X108 (DRAPES) ×2 IMPLANT
DRAPE U-SHAPE 47X51 STRL (DRAPES) ×3 IMPLANT
DRSG ADAPTIC 3X8 NADH LF (GAUZE/BANDAGES/DRESSINGS) IMPLANT
DRSG AQUACEL AG ADV 3.5X10 (GAUZE/BANDAGES/DRESSINGS) ×3 IMPLANT
DRSG TEGADERM 4X4.75 (GAUZE/BANDAGES/DRESSINGS) ×3 IMPLANT
DURAPREP 26ML APPLICATOR (WOUND CARE) ×3 IMPLANT
ELECT REM PT RETURN 9FT ADLT (ELECTROSURGICAL) ×3
ELECTRODE REM PT RTRN 9FT ADLT (ELECTROSURGICAL) ×1 IMPLANT
EVACUATOR 1/8 PVC DRAIN (DRAIN) ×3 IMPLANT
FACESHIELD LNG OPTICON STERILE (SAFETY) ×15 IMPLANT
GAUZE SPONGE 2X2 8PLY STRL LF (GAUZE/BANDAGES/DRESSINGS) ×1 IMPLANT
GLOVE BIOGEL PI IND STRL 7.5 (GLOVE) ×1 IMPLANT
GLOVE BIOGEL PI IND STRL 8 (GLOVE) ×1 IMPLANT
GLOVE BIOGEL PI INDICATOR 7.5 (GLOVE) ×2
GLOVE BIOGEL PI INDICATOR 8 (GLOVE) ×2
GLOVE SURG SS PI 7.5 STRL IVOR (GLOVE) ×3 IMPLANT
GLOVE SURG SS PI 8.0 STRL IVOR (GLOVE) ×6 IMPLANT
GOWN STRL REUS W/TWL XL LVL3 (GOWN DISPOSABLE) ×6 IMPLANT
HANDPIECE INTERPULSE COAX TIP (DISPOSABLE) ×2
IMMOBILIZER KNEE 20 (SOFTGOODS) ×3 IMPLANT
KIT BASIN OR (CUSTOM PROCEDURE TRAY) ×3 IMPLANT
MANIFOLD NEPTUNE II (INSTRUMENTS) ×3 IMPLANT
NEEDLE HYPO 22GX1.5 SAFETY (NEEDLE) ×6 IMPLANT
NS IRRIG 1000ML POUR BTL (IV SOLUTION) IMPLANT
PACK TOTAL JOINT (CUSTOM PROCEDURE TRAY) ×3 IMPLANT
PAD ABD 8X10 STRL (GAUZE/BANDAGES/DRESSINGS) IMPLANT
PADDING CAST COTTON 6X4 STRL (CAST SUPPLIES) IMPLANT
POSITIONER SURGICAL ARM (MISCELLANEOUS) ×3 IMPLANT
SET HNDPC FAN SPRY TIP SCT (DISPOSABLE) ×1 IMPLANT
SPONGE GAUZE 2X2 STER 10/PKG (GAUZE/BANDAGES/DRESSINGS) ×2
SPONGE GAUZE 4X4 12PLY (GAUZE/BANDAGES/DRESSINGS) IMPLANT
SPONGE SURGIFOAM ABS GEL 100 (HEMOSTASIS) IMPLANT
STAPLER VISISTAT (STAPLE) IMPLANT
STRIP CLOSURE SKIN 1/2X4 (GAUZE/BANDAGES/DRESSINGS) ×2 IMPLANT
SUCTION FRAZIER 12FR DISP (SUCTIONS) ×3 IMPLANT
SUT BONE WAX W31G (SUTURE) IMPLANT
SUT MNCRL AB 4-0 PS2 18 (SUTURE) IMPLANT
SUT VIC AB 1 CT1 27 (SUTURE) ×2
SUT VIC AB 1 CT1 27XBRD ANTBC (SUTURE) ×1 IMPLANT
SUT VIC AB 2-0 CT1 27 (SUTURE) ×6
SUT VIC AB 2-0 CT1 TAPERPNT 27 (SUTURE) ×3 IMPLANT
SUT VLOC 180 0 24IN GS25 (SUTURE) ×3 IMPLANT
SYR 20CC LL (SYRINGE) ×6 IMPLANT
TOWEL OR 17X26 10 PK STRL BLUE (TOWEL DISPOSABLE) ×3 IMPLANT
TOWER CARTRIDGE SMART MIX (DISPOSABLE) ×3 IMPLANT
TRAY FOLEY CATH 14FRSI W/METER (CATHETERS) ×3 IMPLANT
WATER STERILE IRR 1500ML POUR (IV SOLUTION) ×6 IMPLANT
WRAP KNEE MAXI GEL POST OP (GAUZE/BANDAGES/DRESSINGS) ×3 IMPLANT

## 2013-12-29 NOTE — Anesthesia Preprocedure Evaluation (Signed)
Anesthesia Evaluation  Patient identified by MRN, date of birth, ID band Patient awake    Reviewed: Allergy & Precautions, H&P , NPO status , Patient's Chart, lab work & pertinent test results  Airway Mallampati: II TM Distance: >3 FB Neck ROM: Full    Dental  (+) Teeth Intact and Dental Advisory Given   Pulmonary asthma , sleep apnea , former smoker,  breath sounds clear to auscultation  Pulmonary exam normal       Cardiovascular hypertension, Pt. on medications negative cardio ROS  Rhythm:Regular Rate:Normal     Neuro/Psych  Neuromuscular disease negative neurological ROS  negative psych ROS   GI/Hepatic negative GI ROS, Neg liver ROS,   Endo/Other  negative endocrine ROSMorbid obesity  Renal/GU negative Renal ROS  negative genitourinary   Musculoskeletal negative musculoskeletal ROS (+)   Abdominal   Peds  Hematology negative hematology ROS (+)   Anesthesia Other Findings Right facial weakness secondary to Botox injection  Reproductive/Obstetrics                           Anesthesia Physical Anesthesia Plan  ASA: II  Anesthesia Plan: General   Post-op Pain Management:    Induction: Intravenous  Airway Management Planned: Oral ETT  Additional Equipment:   Intra-op Plan:   Post-operative Plan: Extubation in OR  Informed Consent: I have reviewed the patients History and Physical, chart, labs and discussed the procedure including the risks, benefits and alternatives for the proposed anesthesia with the patient or authorized representative who has indicated his/her understanding and acceptance.   Dental advisory given  Plan Discussed with: CRNA  Anesthesia Plan Comments:         Anesthesia Quick Evaluation

## 2013-12-29 NOTE — Anesthesia Procedure Notes (Signed)
Procedure Name: Intubation Date/Time: 12/29/2013 9:24 AM Performed by: Danley Danker L Patient Re-evaluated:Patient Re-evaluated prior to inductionOxygen Delivery Method: Circle system utilized Preoxygenation: Pre-oxygenation with 100% oxygen Intubation Type: IV induction Ventilation: Mask ventilation without difficulty and Oral airway inserted - appropriate to patient size Laryngoscope Size: Sabra Heck and 2 Grade View: Grade I Tube type: Oral Tube size: 7.5 mm Number of attempts: 1 Airway Equipment and Method: Stylet Placement Confirmation: ETT inserted through vocal cords under direct vision,  breath sounds checked- equal and bilateral and positive ETCO2 Secured at: 20 cm Tube secured with: Tape Dental Injury: Teeth and Oropharynx as per pre-operative assessment

## 2013-12-29 NOTE — Preoperative (Signed)
Beta Blockers   Reason not to administer Beta Blockers:Not Applicable 

## 2013-12-29 NOTE — Transfer of Care (Signed)
Immediate Anesthesia Transfer of Care Note  Patient: Tanya [name]  Procedure(s) Performed: Procedure(s): LEFT TOTAL KNEE ARTHROPLASTY (Left)  Patient Location: PACU  Anesthesia Type:General  Level of Consciousness: awake, alert  and oriented  Airway & Oxygen Therapy: Patient Spontanous Breathing and Patient connected to face mask oxygen  Post-op Assessment: Report given to PACU RN and Post -op Vital signs reviewed and stable  Post vital signs: Reviewed and stable  Complications: No apparent anesthesia complications

## 2013-12-29 NOTE — Brief Op Note (Signed)
12/29/2013  11:27 AM  PATIENT:  Alyson Reedy  58 y.o. female  PRE-OPERATIVE DIAGNOSIS:  left knee djd  POST-OPERATIVE DIAGNOSIS:  left knee djd  PROCEDURE:  Procedure(s): LEFT TOTAL KNEE ARTHROPLASTY (Left)  SURGEON:  Surgeon(s) and Role:    * Johnn Hai, MD - Primary  PHYSICIAN ASSISTANT:   ASSISTANTS: Bissell   ANESTHESIA:   general  EBL:  Total I/O In: 1000 [I.V.:1000] Out: 350 [Urine:275; Blood:75]  BLOOD ADMINISTERED:none  DRAINS: none   LOCAL MEDICATIONS USED:  MARCAINE     SPECIMEN:  No Specimen  DISPOSITION OF SPECIMEN:  N/A  COUNTS:  YES  TOURNIQUET:  * Missing tourniquet times found for documented tourniquets in log:  [id number] *  DICTATION: .Other Dictation: Dictation Number [id number]  PLAN OF CARE: Admit to inpatient   PATIENT DISPOSITION:  PACU - hemodynamically stable.   Delay start of Pharmacological VTE agent (>24hrs) due to surgical blood loss or risk of bleeding: no

## 2013-12-29 NOTE — Anesthesia Postprocedure Evaluation (Signed)
Anesthesia Post Note  Patient: Tanya [name]  Procedure(s) Performed: Procedure(s) (LRB): LEFT TOTAL KNEE ARTHROPLASTY (Left)  Anesthesia type: General  Patient location: PACU  Post pain: Pain level controlled  Post assessment: Post-op Vital signs reviewed  Last Vitals:  Filed Vitals:   12/29/13 2029  BP:   Pulse: 100  Temp:   Resp: 14    Post vital signs: Reviewed  Level of consciousness: sedated  Complications: No apparent anesthesia complications

## 2013-12-29 NOTE — Evaluation (Signed)
Physical Therapy Evaluation Patient Details Name: Tanya [name] MRN: [id number] DOB: [birth year] Today's Date: 12/29/2013 Time: 1443-1540 PT Time Calculation (min): 26 min  PT Assessment / Plan / Recommendation History of Present Illness     Clinical Impression  Pt s/p L TKR presents with decreased L LE strength/ROM and post op pain limiting functional mobility.  Pt should progress to d/c home with family assist and HHPT follow up.    PT Assessment       Follow Up Recommendations  Home health PT    Does the patient have the potential to tolerate intense rehabilitation      Barriers to Discharge        Equipment Recommendations  None recommended by PT    Recommendations for Other Services OT consult   Frequency      Precautions / Restrictions Precautions Precautions: Knee;Fall Required Braces or Orthoses: Knee Immobilizer - Left Knee Immobilizer - Left: Discontinue once straight leg raise with < 10 degree lag Restrictions Weight Bearing Restrictions: No Other Position/Activity Restrictions: WBAT   Pertinent Vitals/Pain 6/10; premed, MEDs requested, nausea MEds requested      Mobility  Bed Mobility Overal bed mobility: +2 for physical assistance General bed mobility comments: cues for sequence and use of R LE to self assist. Physical assist to manage L LE and to bring trunk to upright Transfers Overall transfer level: Needs assistance Equipment used: Rolling walker (2 wheeled) Transfers: Sit to/from Stand Sit to Stand: +2 physical assistance;+2 safety/equipment;Mod assist General transfer comment: cues for LE management and use of UEs to self assist Ambulation/Gait Ambulation/Gait assistance: +2 physical assistance;+2 safety/equipment;Mod assist Ambulation Distance (Feet): 11 Feet Assistive device: Rolling walker (2 wheeled) Gait Pattern/deviations: Step-to pattern;Decreased step length - right;Decreased step length - left;Shuffle;Antalgic;Trunk flexed Gait  velocity: decr General Gait Details: cues for sequence, stride length, posture and position from RW; ltd by onset of nausea    Exercises Total Joint Exercises Ankle Circles/Pumps: AROM;Supine;Both;15 reps   PT Diagnosis:    PT Problem List:   PT Treatment Interventions:       PT Goals(Current goals can be found in the care plan section) Acute Rehab PT Goals Patient Stated Goal: Resume previous lifestyle with decreased pain PT Goal Formulation: With patient Time For Goal Achievement: 01/05/14 Potential to Achieve Goals: Good  Visit Information  Last PT Received On: 12/29/13 Assistance Needed: +2 (Pt nauseous with OOB activity)       Prior Functioning  Home Living Family/patient expects to be discharged to:: Private residence Living Arrangements: Spouse/significant other Available Help at Discharge: Family Type of Home: House Home Access: Stairs to enter Technical brewer of Steps: 7 Entrance Stairs-Rails: Right;Left Home Layout: Two level Alternate Level Stairs-Number of Steps: 14 Alternate Level Stairs-Rails: Right Home Equipment: Walker - 2 wheels Additional Comments: Pt states she plans to stay on main level with half bath Prior Function Level of Independence: Independent Communication Communication: No difficulties    Cognition  Cognition Arousal/Alertness: Awake/alert Behavior During Therapy: WFL for tasks assessed/performed Overall Cognitive Status: Within Functional Limits for tasks assessed    Extremity/Trunk Assessment Upper Extremity Assessment Upper Extremity Assessment: Overall WFL for tasks assessed Lower Extremity Assessment Lower Extremity Assessment: LLE deficits/detail LLE: Unable to fully assess due to pain Cervical / Trunk Assessment Cervical / Trunk Assessment: Normal   Balance    End of Session PT - End of Session Equipment Utilized During Treatment: Gait belt;Left knee immobilizer Activity Tolerance: Other (comment) (ltd by  nausea) Patient left:  in chair;with call bell/phone within reach;with family/visitor present;with nursing/sitter in room Nurse Communication: Mobility status;Patient requests pain meds;Other (comment) CPM Left Knee CPM Left Knee: Off  GP     Mega Kinkade 12/29/2013, 5:04 PM

## 2013-12-29 NOTE — H&P (View-Only) (Signed)
Tanya [name] DOB: 04-27-1956 Married / Language: English / Race: White Female  H&P date: 12/20/13  Chief Complaint: Left knee pain  History of Present Illness The patient is a 58 year old female who comes in today for a preoperative History and Physical. The patient is scheduled for a left total knee arthroplasty to be performed by Dr. Johnn Hai, MD at [facility] on 12/29/13. The patient is a 58 year old female being followed for their left knee pain. She notes many years of knee pain, which was previously tx by Dr. Tonita Cong. She underwent a knee scope on the left by Dr. Tonita Cong in 2006 with grade 3 and 4 changes at that time. Since then she has had progressively worsening symptoms, steroid injections for flareups, has been using a hinged knee brace. She reports worsening symptoms in December after carrying a computer. The knee does sometimes feel unstable and gives out on her, the brace does help it feel more stable. She notes popping, clicking, crunching, catching, and swelling. Even before her reinjury her pain was severe enough that it is interfering with ADLs, and is now worse. She saw Dr. Gladstone Lighter on 12/22 and was given a steroid injection as well as a prednisone dosepak. Neither seemed to help. She has been taking Norco with relief. At this point she is interested in other options. She is unaware of her bone density status. She typically follows up with her PCP early in the year for her physical. Denies DVT or MRSA. PCN allergy - hives.  We mutually agreed to proceed with a total knee replacement. Risks and benefits of the procedure were discussed including stiffness, suboptimal range of motion, persistent pain, infection requiring removal of prosthesis and reinsertion, need for prophylactic antibiotics in the future, for example, dental procedures, possible need for manipulation, revision in the future and also anesthetic complications including DVT, PE, etc. We discussed  the perioperative course, time in the hospital, postoperative recovery and the need for elevation to control swelling. We also discussed the predicted range of motion and the probability that squatting and kneeling would be unobtainable in the future. In addition, postoperative anticoagulation was discussed. We have obtained preoperative medical clearance as necessary- Dr. Cathlean Cower. Provided her illustrated handout and discussed it in detail. They will enroll in the total joint replacement educational forum at the hospital.  Past Medical Hx Sleep Apnea. uses CPAP Skin Cancer Asthma Osteoarthritis High blood pressure Diverticulosis. 1995 Varicose veins Anemia. no transfusions Measles Mumps  Allergies Amoxicillin *PENICILLINS*. hives Lotrel *ANTIHYPERTENSIVES*. hives Nidofed. 04/26/2002 Pseudoephedrine DX *COUGH/COLD/ALLERGY*. hives  Family History Diabetes Mellitus. First Degree Relatives. mother Cancer. Mother, Father, Sister. mother; father- esophageal CA; sisters- brain tumor, ovarian CA, mouth CA, skin CA; grandmother mothers side  Social History Tobacco use. Former smoker. former smoker; smoke(d) 1 pack(s) per day, quit in 1995 Drug/Alcohol Rehab (Previously). no Drug/Alcohol Rehab (Currently). no Illicit drug use. no Exercise. Exercises daily; does other Current work status. working full timeScientist, research (physical sciences). 0 Alcohol use. current drinker; drinks beer, wine and hard liquor; less than 5 per week Tobacco / smoke exposure. yes outdoors only Marital status. married Living situation. live with spouse in 2 story home, able to sleep on 1st floor post-op, 7 steps to enter home Pain Contract. no Number of flights of stairs before winded. 2-3 Post-Surgical Plans. 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 living will, HPOA  Medication History AmLODIPine Besylate (10MG  Tablet, Oral) Active. Celecoxib (200MG  Capsule,  Oral) Active. Montelukast Sodium (10MG  Tablet, Oral) Active. TraMADol HCl ER (200MG  Tablet ER 24HR, Oral) Active. Valsartan (160MG  Tablet, Oral) Active. Calcium Acetate (667MG  Tablet, Oral) Active. Albuterol Sulfate HFA (108 (90 Base)MCG/ACT Aerosol Soln, Inhalation) Active. Aspirin EC (81MG  Tablet DR, Oral) Active. Glucosamine HCl (1500MG  Tablet, Oral) Active. Medications Reconciled.  Pregnancy / Birth History Pregnant. no  Past Surgical History Colon Polyp Removal - Colonoscopy Arthroscopy of Knee. bilateral Foot Surgery. bilateral Dilation and Curettage of Uterus Rotator Cuff Repair. right x2 Tonsillectomy  Review of Systems(Meg Niemeier Deatra Robinson, PA-C; 12/20/2013 4:18 PM) General:Not Present- Chills, Fever, Night Sweats, Fatigue, Weight Gain, Weight Loss and Memory Loss. Skin:Not Present- Hives, Itching, Rash, Eczema and Lesions. HEENT:Not Present- Tinnitus, Headache, Double Vision, Visual Loss, Hearing Loss and Dentures. Respiratory:Not Present- Shortness of breath with exertion, Shortness of breath at rest, Allergies, Coughing up blood and Chronic Cough. Cardiovascular:Not Present- Chest Pain, Racing/skipping heartbeats, Difficulty Breathing Lying Down, Murmur, Swelling and Palpitations. Gastrointestinal:Not Present- Bloody Stool, Heartburn, Abdominal Pain, Vomiting, Nausea, Constipation, Diarrhea, Difficulty Swallowing, Jaundice and Loss of appetitie. Female Genitourinary:Present- Urinary frequency. Not Present- Blood in Urine, Weak urinary stream, Discharge, Flank Pain, Incontinence, Painful Urination, Urgency, Urinary Retention and Urinating at Night. Musculoskeletal:Present- Joint Pain. Not Present- Muscle Weakness, Muscle Pain, Joint Swelling, Back Pain, Morning Stiffness and Spasms. Neurological:Not Present- Tremor, Dizziness, Blackout spells, Paralysis, Difficulty with balance and Weakness. Psychiatric:Not Present- Insomnia.  Vitals 12/20/2013 2:25 PM Weight:  247 lb Height: 67 in Weight was reported by patient. Height was reported by patient. Body Surface Area: 2.3 m Body Mass Index: 38.69 kg/m BP: 136/82 (Sitting, Left Arm, Standard)  Physical Exam The physical exam findings are as follows:  General Mental Status - Alert, cooperative and good historian. General Appearance- pleasant. Not in acute distress. Orientation- Oriented X3. Build & Nutrition- Well nourished and Well developed. Gait- Stiff and Antalgic.  Head and Neck Head- normocephalic, atraumatic . Neck Global Assessment- supple. no bruit auscultated on the right and no bruit auscultated on the left.  Eye Pupil- Bilateral- Regular and Round. Motion- Bilateral- EOMI.  Chest and Lung Exam Auscultation: Breath sounds:- clear at anterior chest wall and - clear at posterior chest wall. Adventitious sounds:- No Adventitious sounds.  Cardiovascular Auscultation:Rhythm- Regular rate and rhythm. Heart Sounds- S1 WNL and S2 WNL. Murmurs & Other Heart Sounds:Auscultation of the heart reveals - No Murmurs.  Abdomen Palpation/Percussion:Tenderness- Abdomen is non-tender to palpation. Rigidity (guarding)- Abdomen is soft. Auscultation:Auscultation of the abdomen reveals - Bowel sounds normal.  Female Genitourinary Not done, not pertinent to present illness  Musculoskeletal Left Lower Extremity: Left Knee: Inspection and Palpation: Tenderness - medial joint line tender to palpation and lateral joint line tender to palpation. no tenderness to palpation of the superior calf, no tenderness to palpation of the pes anserine bursa, no tenderness to palpation of the quadriceps tendon, no tenderness to palpation of the patellar tendon, no tenderness to palpation of the patella, no tenderness to palpation of the fibular head and no tenderness to palpation of the peroneal nerve. Patellar Tendon - no pain to palpation of the patellar tendon. Swelling -  periarticular swelling present. Effusion - trace. Tissue tension/texture is - soft. Pulses - 2+. Sensation - intact to light touch. Alignment: Tibiofemoral Alignment - varus alignment. Skin - Color - no ecchymosis and no erythema. Strength and Tone: Quadriceps - 5/5. Hamstrings - 5/5. ROM: Flexion: AROM - 100 . Extension: AROM - 5 . Stability - Valgus Laxity at 30 - None. Valgus Laxity at 0 -  None. Varus Laxity at 30 - None. Varus Laxity at 0 - None. Lachman - Negative. Anterior Drawer Test - Negative. Posterior Drawer Test - Negative. Deformities/Malalignments/Discrepancies - no deformities noted. Special Tests: McMurray Test (lateral) - negative. McMurray Test (medial) - negative. Patellar Compression Pain - mild pain.  Imaging xrays from 10/2013 with severe B/L knee DJD, end-stage, with varus deformity. Left knee more severe than right with tricompartmental end-stage bone-on-bone arthrosis. Large PF osteophytes noted.  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 She is scheduled for L total knee replacement by Dr. Tonita Cong on 12/29/13. She has been cleared by her PCP Dr. Cathlean Cower. We again discussed the procedure itself as well as risks, complications, and alternatives, including but not limited to DVT, PE, infx, bleeding, failure of procedure, need for secondary procedure, anesthesia risk, even death. We discussed post-op protocols, need for PT, time out of work, DVT and dental ppx, ice and elevation. Reviewed flexion and extension exercises. All questions were answered. She will need specific work papers filled out when ready to return to work. She has left them in my possession for when that time comes. She still has pain medication to  take prn in the interim and her knee brace for support. Will proceed as scheduled with left total knee replacement. She will follow up 10-14 days post-op for suture removal and xrays. She will call with any questions or concerns in the interim.  Plan left total knee replacement  Signed electronically by Cecilie Kicks, PA-C on 12/20/2013 for Dr. Tonita Cong

## 2013-12-29 NOTE — Interval H&P Note (Signed)
History and Physical Interval Note:  12/29/2013 7:28 AM  Tanya [name]  has presented today for surgery, with the diagnosis of left knee djd  The various methods of treatment have been discussed with the patient and family. After consideration of risks, benefits and other options for treatment, the patient has consented to  Procedure(s): LEFT TOTAL KNEE ARTHROPLASTY (Left) as a surgical intervention .  The patient's history has been reviewed, patient examined, no change in status, stable for surgery.  I have reviewed the patient's chart and labs.  Questions were answered to the patient's satisfaction.     Bobbie Valletta C

## 2013-12-30 LAB — BASIC METABOLIC PANEL
BUN: 8 mg/dL (ref 6–23)
CHLORIDE: 102 meq/L (ref 96–112)
CO2: 24 mEq/L (ref 19–32)
Calcium: 8.6 mg/dL (ref 8.4–10.5)
Creatinine, Ser: 0.59 mg/dL (ref 0.50–1.10)
GFR calc non Af Amer: >90 mL/min (ref >90)
GLUCOSE: 198 mg/dL — AB (ref 70–99)
POTASSIUM: 4.1 meq/L (ref 3.7–5.3)
Sodium: 138 mEq/L (ref 137–147)

## 2013-12-30 LAB — CBC
HCT: 38.8 % (ref 36.0–46.0)
HEMOGLOBIN: 13.2 g/dL (ref 12.0–15.0)
MCH: 30.2 pg (ref 26.0–34.0)
MCHC: 34 g/dL (ref 30.0–36.0)
MCV: 88.8 fL (ref 78.0–100.0)
Platelets: 213 10*3/uL (ref 150–400)
RBC: 4.37 MIL/uL (ref 3.87–5.11)
RDW: 13.1 % (ref 11.5–15.5)
WBC: 13.1 10*3/uL — AB (ref 4.0–10.5)

## 2013-12-30 NOTE — Op Note (Signed)
Tanya [name], Tanya [name]               ACCOUNT NO.:  [id number]  MEDICAL RECORD NO.:  [id number]  LOCATION:  6761                         FACILITY:  [facility]  PHYSICIAN:  Susa Day, M.D.    DATE OF BIRTH:  [birth year]  DATE OF PROCEDURE:  12/29/2013 DATE OF DISCHARGE:                              OPERATIVE REPORT   PREOPERATIVE DIAGNOSES:  End-stage osteoarthrosis, degenerative joint disease of the left knee with varus deformity.  POSTOPERATIVE DIAGNOSES:  End-stage osteoarthrosis, degenerative joint disease of the left knee with varus deformity.  PROCEDURE PERFORMED:  Left total knee arthroplasty.  COMPONENTS:  DePuy rotating platform, 4 femur, 3 tibia, 10 mm insert, and 38 patella.  HISTORY:  A 58 year old, end-stage osteoarthrosis of the left knee, bone on bone, refractory conservative treatment including rest, activity modification, arthroscopy, viscosupplementation, use of an assisted device.  She had severe disabling symptomatology negatively affecting her activities of daily living, was indicated for replacement of the degenerated joint.  Risk and benefits were discussed including bleeding, infection, damage to neurovascular structures, DVT, PE, anesthetic complications, suboptimal range of motion, etc.  TECHNIQUE:  With the patient in supine position, after the induction of adequate general anesthesia and 2 g Kefzol, the left lower extremity was prepped, draped and exsanguinated in usual sterile fashion.  Thigh tourniquet inflated to 300 mmHg.  The knee flexed.  Midline incision was made over the left knee.  Full-thickness flaps were developed.  Median parapatellar arthrotomy performed.  Patella was everted.  Knee was flexed.  Tricompartmental osteoarthrosis was noted bone on bone in all compartments.  We elevated the soft tissues medially preserving the attachment of the MCL.  Remnants of the medial and lateral menisci were removed.  Geniculates cauterized.  ACL remnant  removed.  Step drill utilized to enter the femoral canal.  She had a slight flexion contracture, so we used __________ the distal femur.  Intramedullary guide placed after irrigation.  __________ distal femur was performed. This cut was performed.  We then sized off the anterior cortex.  It was sized to a 4.  This was pinned in 3 degrees of external rotation anterior, posterior, and chamfer cuts performed without difficulty. Osteophytes removed.  Attention turned towards the __________ tibia. External alignment guide was utilized bisecting the tibiotalar joint __________ the defect which was medial, which __________ off the higher side, parallel to the tibia, appropriate slope.  This was then pinned just to medial aspect of the tibial tubercle, centrally drilled, the punch guide was placed.  Then turned our attention back towards completing the femur, bisecting the notch with a box cut jig.  This was then cut.  I then placed a trial femur, tibia 10 mm insert.  We had full extension and full flexion and good stability with varus valgus stressing at 0 and 30 degrees.  Attention was turned towards the patella, measured at 23, cut to a 15.  Using the cutting guide, I then drilled our PEG holes medializing them after measuring it to a 38, placed a trial patella.  We had excellent patellofemoral tracking after this.  We removed all trials, checked posteriorly.  Removed osteophytes. There was some fraying of the popliteus noted.  We copiously irrigated with pulsatile lavage, flexed the knee, all surfaces thoroughly dried, mixed the cement on the back table in the appropriate fashion, injected it into the tibial canal digitally pressurizing it.  We cemented the tibial tray and impacted it.  Redundant cement removed.  We cemented the femur.  The 10 trial insert placed, reduced, actual load held throughout the curing of the cement, cemented the patellar button.  This was clamped and held as well.   After full curing of the cement, with redundant cement then removed, she had full extension and flexion and good stability with varus valgus stress at 0 and 30 degrees.  Negative anterior drawer.  We removed the trial, checked posteriorly, meticulously removed any redundant cement, and copiously irrigated the wound with pulsatile lavage and antibiotic irrigation, placed a 10 mm permanent insert, reduced it, and again had full extension and flexion and good stability with varus valgus stressing at 0 and 30 degrees with a negative anterior drawer and excellent patellofemoral tracking.  We then used Marcaine and anesthetized the periosteum.  We then in slight flexion, closed the patellar arthrotomy first with approximation with #1 Vicryl sutures and then oversewn with a running V-Loc.  Subcutaneous with multiple 2-0 with irrigation as well and skin with subcuticular. Steri-Strips applied.  Sterile dressing applied.  Tourniquet was deflated and there was adequate revascularization of lower extremity appreciated.  Prior to that, she had flexion to gravity at 90 degrees, excellent patellofemoral tracking, and again, good stability.  Knee immobilizer placed.  She was extubated and transported to the recovery room in satisfactory condition.  The patient tolerated the procedure well.  No complications.  Minimal blood loss.  Tourniquet time 80 minutes.  ASSISTANT:  __________     Susa Day, M.D.     JB/MEDQ  D:  12/29/2013  T:  12/30/2013  Job:  [id number]

## 2013-12-30 NOTE — Evaluation (Signed)
Occupational Therapy Evaluation Patient Details Name: Tanya [name] MRN: [id number] DOB: 1955-11-29 Today's Date: 12/30/2013 Time: 2376-2831 OT Time Calculation (min): 27 min  OT Assessment / Plan / Recommendation History of present illness pt was admitted for L TKA   Clinical Impression   Pt was admitted for the above.  All education was completed.  Pt does not need any further OT at this time.      OT Assessment  Patient does not need any further OT services    Follow Up Recommendations  No OT follow up    Barriers to Discharge      Equipment Recommendations  3 in 1 bedside comode    Recommendations for Other Services    Frequency       Precautions / Restrictions Precautions Precautions: Knee;Fall Required Braces or Orthoses: Knee Immobilizer - Left Knee Immobilizer - Left: Discontinue once straight leg raise with < 10 degree lag Restrictions Other Position/Activity Restrictions: WBAT   Pertinent Vitals/Pain L knee min pain; repositioned and reapplied ice    ADL  Grooming: Set up Where Assessed - Grooming: Unsupported sitting Upper Body Bathing: Set up Where Assessed - Upper Body Bathing: Unsupported sitting Lower Body Bathing: Minimal assistance Where Assessed - Lower Body Bathing: Supported sit to stand Upper Body Dressing: Minimal assistance (iv) Where Assessed - Upper Body Dressing: Unsupported sitting Lower Body Dressing: Moderate assistance Where Assessed - Lower Body Dressing: Supported sit to Lobbyist: Minimal assistance Armed forces technical officer Method: Arts development officer: Therapist, occupational and Hygiene: Minimal assistance Where Assessed - Best boy and Hygiene: Sit to stand from 3-in-1 or toilet Equipment Used: Rolling walker Transfers/Ambulation Related to ADLs: spt to 3:1 and took a few steps to chair ADL Comments: Pt performed adl from 3:1.  Husband will assist with LB.  Pt  verbalizes understanding of all education.      OT Diagnosis:    OT Problem List:   OT Treatment Interventions:     OT Goals(Current goals can be found in the care plan section)    Visit Information  Last OT Received On: 12/30/13 Assistance Needed: +1 History of Present Illness: pt was admitted for L TKA       Prior [place] expects to be discharged to:: Private residence Living Arrangements: Spouse/significant other Available Help at Discharge: Family Additional Comments: will stay on main level with 1/2 bath initially Prior Function Level of Independence: Independent Communication Communication: No difficulties         Vision/Perception     Cognition  Cognition Arousal/Alertness: Awake/alert Behavior During Therapy: WFL for tasks assessed/performed Overall Cognitive Status: Within Functional Limits for tasks assessed    Extremity/Trunk Assessment Upper Extremity Assessment Upper Extremity Assessment: Overall WFL for tasks assessed     Mobility Bed Mobility Overal bed mobility: Needs Assistance Bed Mobility: Supine to Sit Supine to sit: Min assist General bed mobility comments: assist for RLE, cues for sequence Transfers Transfers: Sit to/from Stand Sit to Stand: Min assist General transfer comment: cues for UE/LE placement     Exercise     Balance     End of Session OT - End of Session Activity Tolerance: Patient tolerated treatment well Patient left: in chair;with call bell/phone within reach Nurse Communication:  (02 off; sats 95% RA)  GO     Jeffey Janssen 12/30/2013, 9:06 AM Lesle Chris, OTR/L [phone] 12/30/2013

## 2013-12-30 NOTE — Progress Notes (Signed)
Utilization review completed.  

## 2013-12-30 NOTE — Progress Notes (Signed)
Placed patient on CPAP at 11cm for the night. Oxygen set at 2lpm

## 2013-12-30 NOTE — Progress Notes (Signed)
Physical Therapy Treatment Patient Details Name: Tanya [name] MRN: [id number] DOB: April 27, 1956 Today's Date: 12/30/2013 Time: 8563-1497 PT Time Calculation (min): 29 min  PT Assessment / Plan / Recommendation  History of Present Illness pt was admitted for L TKA   PT Comments   Multiple rests required for completion of therex 2* pain.  Pt motivated and should progress well and to d/c home.  Follow Up Recommendations  Home health PT     Does the patient have the potential to tolerate intense rehabilitation     Barriers to Discharge        Equipment Recommendations  None recommended by PT    Recommendations for Other Services OT consult  Frequency 7X/week   Progress towards PT Goals Progress towards PT goals: Progressing toward goals  Plan Current plan remains appropriate    Precautions / Restrictions Precautions Precautions: Knee;Fall Required Braces or Orthoses: Knee Immobilizer - Left Knee Immobilizer - Left: Discontinue once straight leg raise with < 10 degree lag Restrictions Weight Bearing Restrictions: No Other Position/Activity Restrictions: WBAT   Pertinent Vitals/Pain 4/10; premed, cold packs provided.    Mobility  Transfers Overall transfer level: Needs assistance Equipment used: Rolling walker (2 wheeled) Transfers: Sit to/from Stand Sit to Stand: Min assist General transfer comment: cues for UE/LE placement Ambulation/Gait Ambulation/Gait assistance: Min assist Ambulation Distance (Feet): 72 Feet Assistive device: Rolling walker (2 wheeled) Gait Pattern/deviations: Step-to pattern;Decreased step length - right;Decreased step length - left;Shuffle;Trunk flexed Gait velocity: decr General Gait Details: cues for sequence, stride length, posture and position from RW;     Exercises Total Joint Exercises Ankle Circles/Pumps: AROM;Supine;Both;15 reps Quad Sets: AROM;Both;10 reps;Supine Heel Slides: AAROM;10 reps;Supine;Left Straight Leg Raises:  AAROM;Left;10 reps;Supine   PT Diagnosis:    PT Problem List:   PT Treatment Interventions:     PT Goals (current goals can now be found in the care plan section) Acute Rehab PT Goals Patient Stated Goal: Resume previous lifestyle with decreased pain PT Goal Formulation: With patient Time For Goal Achievement: 01/05/14 Potential to Achieve Goals: Good  Visit Information  Last PT Received On: 12/30/13 Assistance Needed: +1 History of Present Illness: pt was admitted for L TKA    Subjective Data  Subjective: Feel more myself than yesterday Patient Stated Goal: Resume previous lifestyle with decreased pain   Cognition  Cognition Arousal/Alertness: Awake/alert Behavior During Therapy: WFL for tasks assessed/performed Overall Cognitive Status: Within Functional Limits for tasks assessed    Balance     End of Session PT - End of Session Equipment Utilized During Treatment: Gait belt;Left knee immobilizer Activity Tolerance: Patient tolerated treatment well Patient left: in chair;with call bell/phone within reach;with family/visitor present Nurse Communication: Mobility status;Patient requests pain meds;Other (comment)   GP     Madeleine Fenn 12/30/2013, 12:45 PM

## 2013-12-30 NOTE — Care Management Note (Signed)
    Page 1 of 2   12/30/2013     4:57:36 PM   CARE MANAGEMENT NOTE 12/30/2013  Patient:  Tanya [name], Tanya [name]   Account Number:  [id number]  Date Initiated:  12/30/2013  Documentation initiated by:  Tanya [name]  Subjective/Objective Assessment:   DX left total knee replacemnt     Action/Plan:   Cm spoke with patient. Plans are for patient to return to her home in [facility] where spouse will be caregiver. She already has RW. 3n1 has been delivered to patient's room.   Anticipated DC Date:  12/31/2013   Anticipated DC Plan:  [place]  CM consult      PAC Choice  [place]   Choice offered to / List presented to:  [name]   DME arranged  3-N-1      DME agency  [place] arranged  [place]   Status of service:  Completed, signed off Medicare Important Message given?   (If response is "NO", the following Medicare IM given date fields will be blank) Date Medicare IM given:   Date Additional Medicare IM given:    Discharge Disposition:    Per UR Regulation:    If discussed at Long Length of Stay Meetings, dates discussed:    Comments:  12/30/2013 Tanya [name] BSN RN CCM [phone] Tanya [name] will provide HHpt services. Start date day after discharge.

## 2013-12-30 NOTE — Progress Notes (Signed)
Physical Therapy Treatment Patient Details Name: Tanya [name] MRN: [id number] DOB: 11-15-1956 Today's Date: 12/30/2013 Time: 6213-0865 PT Time Calculation (min): 25 min  PT Assessment / Plan / Recommendation  History of Present Illness pt was admitted for L TKA   PT Comments     Follow Up Recommendations  Home health PT     Does the patient have the potential to tolerate intense rehabilitation     Barriers to Discharge        Equipment Recommendations  None recommended by PT    Recommendations for Other Services OT consult  Frequency 7X/week   Progress towards PT Goals Progress towards PT goals: Progressing toward goals  Plan Current plan remains appropriate    Precautions / Restrictions Precautions Precautions: Knee;Fall Required Braces or Orthoses: Knee Immobilizer - Left Knee Immobilizer - Left: Discontinue once straight leg raise with < 10 degree lag Restrictions Weight Bearing Restrictions: No Other Position/Activity Restrictions: WBAT   Pertinent Vitals/Pain 3/10; premedicated    Mobility  Bed Mobility Overal bed mobility: Needs Assistance Bed Mobility: Sit to Supine Sit to supine: Min assist General bed mobility comments: assist for RLE, cues for sequence Transfers Overall transfer level: Needs assistance Equipment used: Rolling walker (2 wheeled) Transfers: Sit to/from Stand Sit to Stand: Min assist General transfer comment: cues for UE/LE placement Ambulation/Gait Ambulation/Gait assistance: Min assist Ambulation Distance (Feet): 95 Feet (45) Assistive device: Rolling walker (2 wheeled) Gait Pattern/deviations: Step-to pattern;Decreased step length - right;Decreased step length - left;Shuffle;Antalgic Gait velocity: decr General Gait Details: cues for sequence, stride length, posture and position from RW;     Exercises     PT Diagnosis:    PT Problem List:   PT Treatment Interventions:     PT Goals (current goals can now be found in the care  plan section) Acute Rehab PT Goals Patient Stated Goal: Resume previous lifestyle with decreased pain PT Goal Formulation: With patient Time For Goal Achievement: 01/05/14 Potential to Achieve Goals: Good  Visit Information  Last PT Received On: 12/30/13 Assistance Needed: +1 History of Present Illness: pt was admitted for L TKA    Subjective Data  Patient Stated Goal: Resume previous lifestyle with decreased pain   Cognition  Cognition Arousal/Alertness: Awake/alert Behavior During Therapy: WFL for tasks assessed/performed Overall Cognitive Status: Within Functional Limits for tasks assessed    Balance     End of Session PT - End of Session Equipment Utilized During Treatment: Gait belt;Left knee immobilizer Activity Tolerance: Patient tolerated treatment well Patient left: in bed;with call bell/phone within reach Nurse Communication: Mobility status   GP     Tanya [name] 12/30/2013, 2:58 PM

## 2013-12-30 NOTE — Progress Notes (Signed)
Patient ID: Tanya [name], female   DOB: 12/22/1955, 58 y.o.   MRN: [id number] Subjective: 1 Day Post-Op Procedure(s) (LRB): LEFT TOTAL KNEE ARTHROPLASTY (Left) Seen in AM rounds for Dr. Tonita Cong. Patient reports pain as moderate at the left knee. She has no other c/o this AM.   Patient has complaints of left knee pain  We will start therapy today. Plan is to go home with HHPT after hospital stay.  Objective: Vital signs in last 24 hours: Temp:  [97.2 F (36.2 C)-98.7 F (37.1 C)] 97.2 F (36.2 C) (02/06 0600) Pulse Rate:  [88-106] 88 (02/06 0600) Resp:  [11-17] 16 (02/06 0600) BP: (124-160)/(73-91) 149/91 mmHg (02/06 0600) SpO2:  [93 %-100 %] 99 % (02/06 0600)  Intake/Output from previous day:  Intake/Output Summary (Last 24 hours) at 12/30/13 0738 Last data filed at 12/30/13 0615  Gross per 24 hour  Intake 4109.17 ml  Output   4445 ml  Net -335.83 ml    Intake/Output this shift:    Labs: Results for orders placed during the hospital encounter of 12/29/13  CBC      Result Value Range   WBC 13.1 (*) 4.0 - 10.5 K/uL   RBC 4.37  3.87 - 5.11 MIL/uL   Hemoglobin 13.2  12.0 - 15.0 g/dL   HCT 38.8  36.0 - 46.0 %   MCV 88.8  78.0 - 100.0 fL   MCH 30.2  26.0 - 34.0 pg   MCHC 34.0  30.0 - 36.0 g/dL   RDW 13.1  11.5 - 15.5 %   Platelets 213  150 - 400 K/uL  BASIC METABOLIC PANEL      Result Value Range   Sodium 138  137 - 147 mEq/L   Potassium 4.1  3.7 - 5.3 mEq/L   Chloride 102  96 - 112 mEq/L   CO2 24  19 - 32 mEq/L   Glucose, Bld 198 (*) 70 - 99 mg/dL   BUN 8  6 - 23 mg/dL   Creatinine, Ser 0.59  0.50 - 1.10 mg/dL   Calcium 8.6  8.4 - 10.5 mg/dL   GFR calc non Af Amer >90  >90 mL/min   GFR calc Af Amer >90  >90 mL/min    Exam - Neurologically intact ABD soft Neurovascular intact Sensation intact distally Intact pulses distally Dorsiflexion/Plantar flexion intact Incision: dressing C/D/I and no drainage No cellulitis present Dressing - clean, dry, no  drainage Motor function intact - moving foot and toes well on exam. Compartments soft. No calf pain or sign of DVT Hemovac pulled without difficulty tip intact.  Assessment/Plan: 1 Day Post-Op Procedure(s) (LRB): LEFT TOTAL KNEE ARTHROPLASTY (Left)  Advance diet Up with therapy D/C IV fluids Past Medical History  Diagnosis Date  . GLUCOSE INTOLERANCE 01/11/2008  . OBSTRUCTIVE SLEEP APNEA 09/07/2007  . Blepharospasm 10/09/2010  . HYPERTENSION 09/07/2007  . ASTHMATIC BRONCHITIS, ACUTE 03/19/2009  . ALLERGIC RHINITIS 01/11/2008  . ASTHMA 09/07/2007  . OSTEOARTHRITIS 09/07/2007  . Impaired glucose tolerance 03/16/2011    DVT Prophylaxis - Xarelto Protocol Weight-Bearing as tolerated to left leg Keep foley until tomorrow No vaccines. Will discuss with Dr. Mliss Fritz, [place]. 12/30/2013, 7:38 AM

## 2013-12-30 NOTE — Discharge Instructions (Signed)
Elevate leg above heart 6x a day for 20minutes each °Use knee immobilizer while walking until can SLR x 10 °Use knee immobilizer in bed to keep knee in extension °Daily dressing changes °Aquacel dressing may remain in place for 7 days. May shower with aquacel dressing in place. After 7 days, remove aquacel dressing. Do not remove steri-strips if they are present. Place new dressing with gauze and tape or ACE bandage which should be kept clean and dry and changed daily. °============================= °Information on my medicine - XARELTO® (Rivaroxaban) ° °This medication education was reviewed with me or my healthcare representative as part of my discharge preparation.  The pharmacist that spoke with me during my hospital stay was:  Sheridan Gettel K, RPH ° °Why was Xarelto® prescribed for you? °Xarelto® was prescribed for you to reduce the risk of blood clots forming after orthopedic surgery. The medical term for these abnormal blood clots is venous thromboembolism (VTE). ° °What do you need to know about xarelto® ? °Take your Xarelto® ONCE DAILY at the same time every day. °You may take it either with or without food. ° °If you have difficulty swallowing the tablet whole, you may crush it and mix in applesauce just prior to taking your dose. ° °Take Xarelto® exactly as prescribed by your doctor and DO NOT stop taking Xarelto® without talking to the doctor who prescribed the medication.  Stopping without other VTE prevention medication to take the place of Xarelto® may increase your risk of developing a clot. ° °After discharge, you should have regular check-up appointments with your healthcare provider that is prescribing your Xarelto®.   ° °What do you do if you miss a dose? °If you miss a dose, take it as soon as you remember on the same day then continue your regularly scheduled once daily regimen the next day. Do not take two doses of Xarelto® on the same day.  ° °Important Safety Information °A possible side  effect of Xarelto® is bleeding. You should call your healthcare provider right away if you experience any of the following: °? Bleeding from an injury or your nose that does not stop. °? Unusual colored urine (red or dark brown) or unusual colored stools (red or black). °? Unusual bruising for unknown reasons. °? A serious fall or if you hit your head (even if there is no bleeding). ° °Some medicines may interact with Xarelto® and might increase your risk of bleeding while on Xarelto®. To help avoid this, consult your healthcare provider or pharmacist prior to using any new prescription or non-prescription medications, including herbals, vitamins, non-steroidal anti-inflammatory drugs (NSAIDs) and supplements. ° °This website has more information on Xarelto®: www.xarelto.com. ° ° °

## 2013-12-30 NOTE — Progress Notes (Signed)
CSW consulted for SNF placement. PN reviewed. PT recommends HHPT following hospital d/c. RNCM will assist with d/c planning needs.  Werner Lean LCSW [phone]

## 2013-12-31 LAB — CBC
HEMATOCRIT: 37.4 % (ref 36.0–46.0)
HEMOGLOBIN: 12.4 g/dL (ref 12.0–15.0)
MCH: 29.8 pg (ref 26.0–34.0)
MCHC: 33.2 g/dL (ref 30.0–36.0)
MCV: 89.9 fL (ref 78.0–100.0)
Platelets: 189 10*3/uL (ref 150–400)
RBC: 4.16 MIL/uL (ref 3.87–5.11)
RDW: 13.4 % (ref 11.5–15.5)
WBC: 12.7 10*3/uL — ABNORMAL HIGH (ref 4.0–10.5)

## 2013-12-31 MED ORDER — BISACODYL 5 MG PO TBEC
5.0000 mg | DELAYED_RELEASE_TABLET | Freq: Every day | ORAL | Status: DC | PRN
  Administered 2013-12-31: 5 mg via ORAL
  Filled 2013-12-31: qty 1

## 2013-12-31 NOTE — Progress Notes (Signed)
Physical Therapy Treatment Patient Details Name: Tanya [name] MRN: [id number] DOB: 1956-04-29 Today's Date: 12/31/2013 Time: 0830-0900 PT Time Calculation (min): 30 min  PT Assessment / Plan / Recommendation  History of Present Illness pt was admitted for L TKA   PT Comments   POD # 2 am session.  Instructed pt on KI use for amb/stairs and when to D/C.  Assisted OOB to amb in hallway.  Practiced 2 steps B rails then amb back to room when breakfast tray arrived.  Positioned in recliner with ICE.  Will return  For second session to perform 7 steps before D/C to home.   Follow Up Recommendations  Home health PT     Does the patient have the potential to tolerate intense rehabilitation     Barriers to Discharge        Equipment Recommendations  None recommended by PT    Recommendations for Other Services    Frequency 7X/week   Progress towards PT Goals Progress towards PT goals: Progressing toward goals  Plan      Precautions / Restrictions Precautions Precautions: Knee;Fall Precaution Comments: Instructed pt on KI use for amb and stairs Required Braces or Orthoses: Knee Immobilizer - Left Restrictions Weight Bearing Restrictions: No Other Position/Activity Restrictions: WBAT    Pertinent Vitals/Pain C/o 6/10 ICE applied    Mobility  Bed Mobility Overal bed mobility: Needs Assistance Bed Mobility: Supine to Sit Supine to sit: Min guard General bed mobility comments: MinGuard assist to support  Transfers Overall transfer level: Needs assistance Equipment used: Rolling walker (2 wheeled) Transfers: Sit to/from Stand Sit to Stand: Min guard General transfer comment: MinGuard assist to support L LE off bed plus increased time Ambulation/Gait Ambulation/Gait assistance: Supervision;Min guard Ambulation Distance (Feet): 75 Feet Assistive device: Rolling walker (2 wheeled) Gait Pattern/deviations: Step-to pattern;Decreased stance time - left Gait velocity:  decr General Gait Details: cues for sequence, stride length, posture and position from RW;  Stairs: Yes Stairs assistance: Min guard Stair Management: Two rails;Forwards Number of Stairs: 2 General stair comments: 25% VC's on proper sequencing and safety     PT Goals (current goals can now be found in the care plan section)    Visit Information  Last PT Received On: 12/31/13 Assistance Needed: +1 History of Present Illness: pt was admitted for L TKA    Subjective Data      Cognition       Balance     End of Session PT - End of Session Equipment Utilized During Treatment: Gait belt;Left knee immobilizer Activity Tolerance: Patient tolerated treatment well Patient left: in chair;with call bell/phone within reach   Rica Koyanagi  PTA [facility]  Acute  Rehab Pager      [phone]

## 2013-12-31 NOTE — Progress Notes (Signed)
Tanya [name]  MRN: [id number] DOB/Age: [birth year] 58 y.o. Physician: Rada Hay Procedure: Procedure(s) (LRB): LEFT TOTAL KNEE ARTHROPLASTY (Left)     Subjective: Overall doing well. Working with PT, otherwise pain controlled. Tolerating po's, voiding well and passing flatus  Vital Signs Temp:  [97.5 F (36.4 C)-99.7 F (37.6 C)] 97.5 F (36.4 C) (02/07 0500) Pulse Rate:  [89-105] 90 (02/07 0500) Resp:  [15-16] 16 (02/07 0500) BP: (127-144)/(74-91) 144/91 mmHg (02/07 0500) SpO2:  [92 %-98 %] 98 % (02/07 0500)  Lab Results  Recent Labs  12/30/13 0400 12/31/13 0421  WBC 13.1* 12.7*  HGB 13.2 12.4  HCT 38.8 37.4  PLT 213 189   BMET  Recent Labs  12/30/13 0400  NA 138  K 4.1  CL 102  CO2 24  GLUCOSE 198*  BUN 8  CREATININE 0.59  CALCIUM 8.6   INR  Date Value Range Status  12/23/2013 0.97  0.00 - 1.49 Final     Exam Left knee ACE wrap removed. Minimal swelling Aquacel in good condition        Plan PT now and if passes goals DC home today  [facility] for Dr.Kevin Supple 12/31/2013, 8:36 AM

## 2013-12-31 NOTE — Progress Notes (Signed)
Discharge instructions and prescriptions  reviewed with patient using teach back method. No questions at this time.

## 2013-12-31 NOTE — Progress Notes (Signed)
Physical Therapy Treatment Patient Details Name: LANEA VANKIRK MRN: [id number] DOB: [birth year] Today's Date: 12/31/2013 Time: 4401-0272 PT Time Calculation (min): 25 min  PT Assessment / Plan / Recommendation  History of Present Illness pt was admitted for L TKA   PT Comments   POD # 2 pm session with spouse to perform stair training.   Follow Up Recommendations  Home health PT     Does the patient have the potential to tolerate intense rehabilitation     Barriers to Discharge        Equipment Recommendations  None recommended by PT    Recommendations for Other Services    Frequency 7X/week   Progress towards PT Goals Progress towards PT goals: Progressing toward goals  Plan      Precautions / Restrictions Precautions Precautions: Knee;Fall Precaution Comments: Instructed pt and spouse on KI use for amb and stairs Required Braces or Orthoses: Knee Immobilizer - Left Knee Immobilizer - Left: Discontinue once straight leg raise with < 10 degree lag Restrictions Weight Bearing Restrictions: No Other Position/Activity Restrictions: WBAT   Pertinent Vitals/Pain "I'm good"    Mobility  Bed Mobility Overal bed mobility: Needs Assistance Bed Mobility: Supine to Sit Supine to sit: Min guard General bed mobility comments: Pt OOB in recliner Transfers Overall transfer level: Needs assistance Equipment used: Rolling walker (2 wheeled) Transfers: Sit to/from Stand Sit to Stand: Supervision;Min guard General transfer comment: increased time Ambulation/Gait Ambulation/Gait assistance: Supervision Ambulation Distance (Feet): 12 Feet (Tx session focused on stairs with spouse) Assistive device: Rolling walker (2 wheeled) Gait Pattern/deviations: Step-to pattern;Decreased stance time - left Gait velocity: decr General Gait Details: cues for sequence, stride length, posture and position from RW;  Stairs: Yes Stairs assistance: Min guard Stair Management: One rail  Right;Forwards;With crutches Number of Stairs: 4 General stair comments: <25% VC's on proper sequencing and safety performed with spouse present    PT Goals (current goals can now be found in the care plan section)    Visit Information  Last PT Received On: 12/31/13 Assistance Needed: +1 History of Present Illness: pt was admitted for L TKA    Subjective Data      Cognition       Balance     End of Session PT - End of Session Equipment Utilized During Treatment: Gait belt;Left knee immobilizer Activity Tolerance: Patient tolerated treatment well Patient left: in chair;with call bell/phone within reach Nurse Communication:  (Pt ready for D/C)   Rica Koyanagi  PTA [facility]  Acute  Rehab Pager      [phone]

## 2014-01-01 NOTE — Discharge Summary (Signed)
Physician Discharge Summary   Patient ID: Tanya [name] MRN: [id number] DOB/AGE: 01/10/1956 57 y.o.  Admit date: 12/29/2013 Discharge date: 12/31/2013  Primary Diagnosis: Left knee DJD  Admission Diagnoses:  Past Medical History  Diagnosis Date  . GLUCOSE INTOLERANCE 01/11/2008  . OBSTRUCTIVE SLEEP APNEA 09/07/2007  . Blepharospasm 10/09/2010  . HYPERTENSION 09/07/2007  . ASTHMATIC BRONCHITIS, ACUTE 03/19/2009  . ALLERGIC RHINITIS 01/11/2008  . ASTHMA 09/07/2007  . OSTEOARTHRITIS 09/07/2007  . Impaired glucose tolerance 03/16/2011   Discharge Diagnoses:   Principal Problem:   Left knee DJD  Estimated body mass index is 38.36 kg/(m^2) as calculated from the following:   Height as of this encounter: 5' 7" (1.702 m).   Weight as of this encounter: 111.131 kg (245 lb).  Procedure:  Procedure(s) (LRB): LEFT TOTAL KNEE ARTHROPLASTY (Left)   Consults: None  HPI: see H&P Laboratory Data: Admission on 12/29/2013, Discharged on 12/31/2013  Component Date Value Range Status  . WBC 12/30/2013 13.1* 4.0 - 10.5 K/uL Final  . RBC 12/30/2013 4.37  3.87 - 5.11 MIL/uL Final  . Hemoglobin 12/30/2013 13.2  12.0 - 15.0 g/dL Final  . HCT 12/30/2013 38.8  36.0 - 46.0 % Final  . MCV 12/30/2013 88.8  78.0 - 100.0 fL Final  . MCH 12/30/2013 30.2  26.0 - 34.0 pg Final  . MCHC 12/30/2013 34.0  30.0 - 36.0 g/dL Final  . RDW 12/30/2013 13.1  11.5 - 15.5 % Final  . Platelets 12/30/2013 213  150 - 400 K/uL Final  . Sodium 12/30/2013 138  137 - 147 mEq/L Final  . Potassium 12/30/2013 4.1  3.7 - 5.3 mEq/L Final  . Chloride 12/30/2013 102  96 - 112 mEq/L Final  . CO2 12/30/2013 24  19 - 32 mEq/L Final  . Glucose, Bld 12/30/2013 198* 70 - 99 mg/dL Final  . BUN 12/30/2013 8  6 - 23 mg/dL Final  . Creatinine, Ser 12/30/2013 0.59  0.50 - 1.10 mg/dL Final  . Calcium 12/30/2013 8.6  8.4 - 10.5 mg/dL Final  . GFR calc non Af Amer 12/30/2013 >90  >90 mL/min Final  . GFR calc Af Amer 12/30/2013 >90  >90  mL/min Final   Comment: (NOTE)                          The eGFR has been calculated using the CKD EPI equation.                          This calculation has not been validated in all clinical situations.                          eGFR's persistently <90 mL/min signify possible Chronic Kidney                          Disease.  . WBC 12/31/2013 12.7* 4.0 - 10.5 K/uL Final  . RBC 12/31/2013 4.16  3.87 - 5.11 MIL/uL Final  . Hemoglobin 12/31/2013 12.4  12.0 - 15.0 g/dL Final  . HCT 12/31/2013 37.4  36.0 - 46.0 % Final  . MCV 12/31/2013 89.9  78.0 - 100.0 fL Final  . MCH 12/31/2013 29.8  26.0 - 34.0 pg Final  . MCHC 12/31/2013 33.2  30.0 - 36.0 g/dL Final  . RDW 12/31/2013 13.4  11.5 - 15.5 % Final  . Platelets 12/31/2013   189  150 - 400 K/uL Final  Hospital Outpatient Visit on 12/23/2013  Component Date Value Range Status  . Sodium 12/23/2013 140  137 - 147 mEq/L Final  . Potassium 12/23/2013 4.7  3.7 - 5.3 mEq/L Final  . Chloride 12/23/2013 103  96 - 112 mEq/L Final  . CO2 12/23/2013 26  19 - 32 mEq/L Final  . Glucose, Bld 12/23/2013 136* 70 - 99 mg/dL Final  . BUN 12/23/2013 13  6 - 23 mg/dL Final  . Creatinine, Ser 12/23/2013 0.72  0.50 - 1.10 mg/dL Final  . Calcium 12/23/2013 9.3  8.4 - 10.5 mg/dL Final  . GFR calc non Af Amer 12/23/2013 >90  >90 mL/min Final  . GFR calc Af Amer 12/23/2013 >90  >90 mL/min Final   Comment: (NOTE)                          The eGFR has been calculated using the CKD EPI equation.                          This calculation has not been validated in all clinical situations.                          eGFR's persistently <90 mL/min signify possible Chronic Kidney                          Disease.  . WBC 12/23/2013 5.8  4.0 - 10.5 K/uL Final  . RBC 12/23/2013 4.68  3.87 - 5.11 MIL/uL Final  . Hemoglobin 12/23/2013 14.1  12.0 - 15.0 g/dL Final  . HCT 12/23/2013 42.2  36.0 - 46.0 % Final  . MCV 12/23/2013 90.2  78.0 - 100.0 fL Final  . MCH 12/23/2013 30.1   26.0 - 34.0 pg Final  . MCHC 12/23/2013 33.4  30.0 - 36.0 g/dL Final  . RDW 12/23/2013 13.3  11.5 - 15.5 % Final  . Platelets 12/23/2013 174  150 - 400 K/uL Final  . Prothrombin Time 12/23/2013 12.7  11.6 - 15.2 seconds Final  . INR 12/23/2013 0.97  0.00 - 1.49 Final  . ABO/RH(D) 12/23/2013 O POS   Final  . Antibody Screen 12/23/2013 NEG   Final  . Sample Expiration 12/23/2013 01/01/2014   Final  . Color, Urine 12/23/2013 YELLOW  YELLOW Final  . APPearance 12/23/2013 CLOUDY* CLEAR Final  . Specific Gravity, Urine 12/23/2013 1.019  1.005 - 1.030 Final  . pH 12/23/2013 7.5  5.0 - 8.0 Final  . Glucose, UA 12/23/2013 NEGATIVE  NEGATIVE mg/dL Final  . Hgb urine dipstick 12/23/2013 NEGATIVE  NEGATIVE Final  . Bilirubin Urine 12/23/2013 NEGATIVE  NEGATIVE Final  . Ketones, ur 12/23/2013 NEGATIVE  NEGATIVE mg/dL Final  . Protein, ur 12/23/2013 NEGATIVE  NEGATIVE mg/dL Final  . Urobilinogen, UA 12/23/2013 0.2  0.0 - 1.0 mg/dL Final  . Nitrite 12/23/2013 NEGATIVE  NEGATIVE Final  . Leukocytes, UA 12/23/2013 NEGATIVE  NEGATIVE Final   MICROSCOPIC NOT DONE ON URINES WITH NEGATIVE PROTEIN, BLOOD, LEUKOCYTES, NITRITE, OR GLUCOSE <1000 mg/dL.  . MRSA, PCR 12/23/2013 NEGATIVE  NEGATIVE Final  . Staphylococcus aureus 12/23/2013 NEGATIVE  NEGATIVE Final   Comment:                                   The Xpert SA Assay (FDA                          approved for NASAL specimens                          in patients over 21 years of age),                          is one component of                          a comprehensive surveillance                          program.  Test performance has                          been validated by [facility] for patients greater                          than or equal to 1 year old.                          It is not intended                          to diagnose infection nor to                          guide or monitor treatment.  . ABO/RH(D)  12/23/2013 O POS   Final     X-Rays:Dg Chest 2 View  12/23/2013   CLINICAL DATA:  Preoperative films.  Patient for knee replacement.  EXAM: CHEST  2 VIEW  COMPARISON:  PA and lateral chest 03/19/2009.  FINDINGS: Heart size and mediastinal contours are within normal limits. Both lungs are clear. Visualized skeletal structures are unremarkable. Postoperative change right shoulder is noted.  IMPRESSION: Negative exam.   Electronically Signed   By: Thomas  Dalessio M.D.   On: 12/23/2013 15:54   Dg Knee 1-2 Views Left  12/23/2013   CLINICAL DATA:  Left knee DJD, preop left TKA.  EXAM: LEFT KNEE - 1-2 VIEW  COMPARISON:  None.  FINDINGS: There are moderate tricompartmental osteoarthritic changes most prominent over the patellofemoral joint. Possible bipartite patella. No evidence of acute fracture dislocation. No significant joint effusion.  IMPRESSION: Moderate tricompartmental osteoarthritis.   Electronically Signed   By: Daniel  Boyle M.D.   On: 12/23/2013 15:52    EKG: Orders placed in visit on 12/23/13  . EKG 12-LEAD     Hospital Course: Andee A Sauser is a 57 y.o. who was admitted to [facility]. They were brought to the operating room on 12/29/2013 and underwent Procedure(s): LEFT TOTAL KNEE ARTHROPLASTY.  Patient tolerated the procedure well and was later transferred to the recovery room and then to the orthopaedic floor for postoperative care.  They were given PO and IV analgesics for pain control following their surgery.  They were given 24 hours of postoperative antibiotics of    Anti-infectives   Start     Dose/Rate Route Frequency Ordered Stop   12/29/13 1400  ceFAZolin (ANCEF) IVPB 2 g/50 mL premix     2 g 100 mL/hr over 30 Minutes Intravenous Every 6 hours 12/29/13 1332 12/29/13 2049   12/29/13 1025  polymyxin B 500,000 Units, bacitracin 50,000 Units in sodium chloride irrigation 0.9 % 500 mL irrigation  Status:  Discontinued       As needed 12/29/13 1025 12/29/13 1144    12/29/13 0645  ceFAZolin (ANCEF) IVPB 2 g/50 mL premix  Status:  Discontinued     2 g 100 mL/hr over 30 Minutes Intravenous On call to O.R. 12/29/13 2585 12/29/13 1316     and started on DVT prophylaxis in the form of Xarelto, TED hose and SCDs.   PT and OT were ordered for total joint protocol.  Discharge planning consulted to help with postop disposition and equipment needs.  Patient had a fair night on the evening of surgery.  They started to get up OOB with therapy on day one. Hemovac drain was pulled without difficulty.  Continued to work with therapy into day two.  By day two, the patient had progressed with therapy and meeting their goals.  Incision was healing well.  Patient was seen in rounds and was ready to go home.   Discharge Medications: Prior to Admission medications   Medication Sig Start Date End Date Taking? Authorizing Provider  amLODipine (NORVASC) 10 MG tablet Take 10 mg by mouth every morning.   Yes Historical Provider, MD  calcium acetate (PHOSLO) 667 MG capsule Take 667 mg by mouth at bedtime.   Yes Historical Provider, MD  cetirizine (ZYRTEC) 10 MG tablet Take 10 mg by mouth daily.   Yes Historical Provider, MD  montelukast (SINGULAIR) 10 MG tablet Take 10 mg by mouth at bedtime.   Yes Historical Provider, MD  Multiple Vitamin (MULTIVITAMIN WITH MINERALS) TABS tablet Take 1 tablet by mouth daily.   Yes Historical Provider, MD  traMADol (ULTRAM-ER) 200 MG 24 hr tablet Take 200 mg by mouth every morning.   Yes Historical Provider, MD  valsartan (DIOVAN) 160 MG tablet Take 160 mg by mouth every morning.   Yes Historical Provider, MD  celecoxib (CELEBREX) 200 MG capsule Take 200 mg by mouth 2 (two) times daily.    Historical Provider, MD  docusate sodium (COLACE) 100 MG capsule Take 1 capsule (100 mg total) by mouth 2 (two) times daily. 12/29/13   Johnn Hai, MD  Glucosamine-Chondroit-Vit C-Mn (GLUCOSAMINE 1500 COMPLEX PO) Take 1 tablet by mouth 2 (two) times daily.      Historical Provider, MD  methocarbamol (ROBAXIN) 500 MG tablet Take 1 tablet (500 mg total) by mouth 3 (three) times daily between meals as needed for muscle spasms. 12/29/13   Johnn Hai, MD  oxyCODONE-acetaminophen (PERCOCET) 7.5-325 MG per tablet Take 1-2 tablets by mouth every 4 (four) hours as needed for pain. 12/29/13   Johnn Hai, MD  rivaroxaban (XARELTO) 10 MG TABS tablet Take 1 tablet (10 mg total) by mouth daily. 12/29/13   Johnn Hai, MD    Diet: Regular diet Activity:WBAT Follow-up:in 10-14 days Disposition - Home Discharged Condition: good    Future Appointments Provider Department Dept Phone   02/03/2014 8:30 AM Biagio Borg, MD [facility] [phone]   02/09/2014 9:00 AM Rigoberto Noel, MD [facility] [phone]       Medication List  STOP taking these medications       albuterol 108 (90 BASE) MCG/ACT inhaler  Commonly known as:  PROVENTIL HFA;VENTOLIN HFA     aspirin 81 MG EC tablet     HYDROcodone-acetaminophen 5-325 MG per tablet  Commonly known as:  NORCO/VICODIN      TAKE these medications       amLODipine 10 MG tablet  Commonly known as:  NORVASC  Take 10 mg by mouth every morning.     calcium acetate 667 MG capsule  Commonly known as:  PHOSLO  Take 667 mg by mouth at bedtime.     celecoxib 200 MG capsule  Commonly known as:  CELEBREX  Take 200 mg by mouth 2 (two) times daily.     cetirizine 10 MG tablet  Commonly known as:  ZYRTEC  Take 10 mg by mouth daily.     docusate sodium 100 MG capsule  Commonly known as:  COLACE  Take 1 capsule (100 mg total) by mouth 2 (two) times daily.     GLUCOSAMINE 1500 COMPLEX PO  Take 1 tablet by mouth 2 (two) times daily.     methocarbamol 500 MG tablet  Commonly known as:  ROBAXIN  Take 1 tablet (500 mg total) by mouth 3 (three) times daily between meals as needed for muscle spasms.     montelukast 10 MG tablet  Commonly known as:  SINGULAIR  Take  10 mg by mouth at bedtime.     multivitamin with minerals Tabs tablet  Take 1 tablet by mouth daily.     oxyCODONE-acetaminophen 7.5-325 MG per tablet  Commonly known as:  PERCOCET  Take 1-2 tablets by mouth every 4 (four) hours as needed for pain.     rivaroxaban 10 MG Tabs tablet  Commonly known as:  XARELTO  Take 1 tablet (10 mg total) by mouth daily.     traMADol 200 MG 24 hr tablet  Commonly known as:  ULTRAM-ER  Take 200 mg by mouth every morning.     valsartan 160 MG tablet  Commonly known as:  DIOVAN  Take 160 mg by mouth every morning.           Follow-up Information   Follow up with BEANE,JEFFREY C, MD In 2 weeks.   Specialty:  Orthopedic Surgery   Contact information:   [address] [place] [address] [phone]       Follow up with [facility]. [facility])    Contact information:   [address] [place] [address] [phone]       Signed: Cecilie Kicks. 01/01/2014, 8:38 AM

## 2014-02-01 ENCOUNTER — Other Ambulatory Visit (INDEPENDENT_AMBULATORY_CARE_PROVIDER_SITE_OTHER): Payer: Managed Care, Other (non HMO)

## 2014-02-01 DIAGNOSIS — Z0000 Encounter for general adult medical examination without abnormal findings: Secondary | ICD-10-CM

## 2014-02-01 DIAGNOSIS — R7309 Other abnormal glucose: Secondary | ICD-10-CM

## 2014-02-01 DIAGNOSIS — R7302 Impaired glucose tolerance (oral): Secondary | ICD-10-CM

## 2014-02-01 LAB — BASIC METABOLIC PANEL
BUN: 18 mg/dL (ref 6–23)
CALCIUM: 9.7 mg/dL (ref 8.4–10.5)
CO2: 27 mEq/L (ref 19–32)
CREATININE: 0.7 mg/dL (ref 0.4–1.2)
Chloride: 106 mEq/L (ref 96–112)
GFR: 98.47 mL/min (ref >60.00)
Glucose, Bld: 117 mg/dL — ABNORMAL HIGH (ref 70–99)
POTASSIUM: 4.9 meq/L (ref 3.5–5.1)
Sodium: 140 mEq/L (ref 135–145)

## 2014-02-01 LAB — URINALYSIS, ROUTINE W REFLEX MICROSCOPIC
BILIRUBIN URINE: NEGATIVE
Hgb urine dipstick: NEGATIVE
KETONES UR: NEGATIVE
Leukocytes, UA: NEGATIVE
Nitrite: NEGATIVE
PH: 6.5 (ref 5.0–8.0)
SPECIFIC GRAVITY, URINE: 1.02 (ref 1.000–1.030)
Total Protein, Urine: NEGATIVE
Urine Glucose: NEGATIVE
Urobilinogen, UA: 0.2 (ref 0.0–1.0)

## 2014-02-01 LAB — CBC WITH DIFFERENTIAL/PLATELET
BASOS PCT: 0.4 % (ref 0.0–3.0)
Basophils Absolute: 0 10*3/uL (ref 0.0–0.1)
EOS PCT: 1.7 % (ref 0.0–5.0)
Eosinophils Absolute: 0.1 10*3/uL (ref 0.0–0.7)
HEMATOCRIT: 40.5 % (ref 36.0–46.0)
Hemoglobin: 13.4 g/dL (ref 12.0–15.0)
LYMPHS ABS: 1.7 10*3/uL (ref 0.7–4.0)
Lymphocytes Relative: 30.9 % (ref 12.0–46.0)
MCHC: 33.1 g/dL (ref 30.0–36.0)
MCV: 90.4 fl (ref 78.0–100.0)
Monocytes Absolute: 0.4 10*3/uL (ref 0.1–1.0)
Monocytes Relative: 6.5 % (ref 3.0–12.0)
Neutro Abs: 3.4 10*3/uL (ref 1.4–7.7)
Neutrophils Relative %: 60.5 % (ref 43.0–77.0)
Platelets: 202 10*3/uL (ref 150.0–400.0)
RBC: 4.47 Mil/uL (ref 3.87–5.11)
RDW: 14 % (ref 11.5–14.6)
WBC: 5.6 10*3/uL (ref 4.5–10.5)

## 2014-02-01 LAB — LIPID PANEL
CHOLESTEROL: 156 mg/dL (ref 0–200)
HDL: 51.8 mg/dL (ref >39.00)
LDL CALC: 89 mg/dL (ref 0–99)
Total CHOL/HDL Ratio: 3
Triglycerides: 75 mg/dL (ref 0.0–149.0)
VLDL: 15 mg/dL (ref 0.0–40.0)

## 2014-02-01 LAB — TSH: TSH: 0.47 u[IU]/mL (ref 0.35–5.50)

## 2014-02-01 LAB — HEPATIC FUNCTION PANEL
ALT: 20 U/L (ref 0–35)
AST: 15 U/L (ref 0–37)
Albumin: 4.1 g/dL (ref 3.5–5.2)
Alkaline Phosphatase: 90 U/L (ref 39–117)
BILIRUBIN TOTAL: 0.5 mg/dL (ref 0.3–1.2)
Bilirubin, Direct: 0.1 mg/dL (ref 0.0–0.3)
Total Protein: 7.1 g/dL (ref 6.0–8.3)

## 2014-02-01 LAB — HEMOGLOBIN A1C: Hgb A1c MFr Bld: 6.2 % (ref 4.6–6.5)

## 2014-02-03 ENCOUNTER — Ambulatory Visit (INDEPENDENT_AMBULATORY_CARE_PROVIDER_SITE_OTHER): Payer: Managed Care, Other (non HMO) | Admitting: Internal Medicine

## 2014-02-03 ENCOUNTER — Encounter: Payer: Self-pay | Admitting: Internal Medicine

## 2014-02-03 VITALS — BP 122/80 | HR 79 | Temp 98.7°F | Ht 67.0 in | Wt 248.0 lb

## 2014-02-03 DIAGNOSIS — R7302 Impaired glucose tolerance (oral): Secondary | ICD-10-CM

## 2014-02-03 DIAGNOSIS — M25561 Pain in right knee: Secondary | ICD-10-CM | POA: Insufficient documentation

## 2014-02-03 DIAGNOSIS — R7309 Other abnormal glucose: Secondary | ICD-10-CM

## 2014-02-03 DIAGNOSIS — Z0000 Encounter for general adult medical examination without abnormal findings: Secondary | ICD-10-CM

## 2014-02-03 DIAGNOSIS — J45909 Unspecified asthma, uncomplicated: Secondary | ICD-10-CM

## 2014-02-03 DIAGNOSIS — C439 Malignant melanoma of skin, unspecified: Secondary | ICD-10-CM | POA: Insufficient documentation

## 2014-02-03 DIAGNOSIS — I10 Essential (primary) hypertension: Secondary | ICD-10-CM

## 2014-02-03 DIAGNOSIS — M25569 Pain in unspecified knee: Secondary | ICD-10-CM

## 2014-02-03 LAB — HM MAMMOGRAPHY

## 2014-02-03 MED ORDER — MONTELUKAST SODIUM 10 MG PO TABS: 10.0000 mg | ORAL_TABLET | Freq: Every day | ORAL | Status: DC

## 2014-02-03 MED ORDER — TRAMADOL HCL ER 200 MG PO TB24: 200.0000 mg | ORAL_TABLET | Freq: Every morning | ORAL | Status: DC

## 2014-02-03 MED ORDER — VALSARTAN 160 MG PO TABS: 160.0000 mg | ORAL_TABLET | Freq: Every morning | ORAL | Status: DC

## 2014-02-03 MED ORDER — AMLODIPINE BESYLATE 10 MG PO TABS: 10.0000 mg | ORAL_TABLET | Freq: Every morning | ORAL | Status: DC

## 2014-02-03 MED ORDER — CETIRIZINE HCL 10 MG PO TABS: 10.0000 mg | ORAL_TABLET | Freq: Every day | ORAL | Status: DC

## 2014-02-03 MED ORDER — CALCIUM ACETATE 667 MG PO CAPS: 667.0000 mg | ORAL_CAPSULE | Freq: Every day | ORAL | Status: DC

## 2014-02-03 MED ORDER — CELECOXIB 200 MG PO CAPS: 200.0000 mg | ORAL_CAPSULE | Freq: Two times a day (BID) | ORAL | Status: DC

## 2014-02-03 NOTE — Assessment & Plan Note (Signed)
stable overall by history and exam, recent data reviewed with pt, and pt to continue medical treatment as before,  to f/u any worsening symptoms or concerns BP Readings from Last 3 Encounters:  02/03/14 122/80  12/31/13 159/86  12/31/13 159/86

## 2014-02-03 NOTE — Assessment & Plan Note (Signed)
stable overall by history and exam, recent data reviewed with pt, and pt to continue medical treatment as before,  to f/u any worsening symptoms or concerns SpO2 Readings from Last 3 Encounters:  02/03/14 97%  12/31/13 98%  12/31/13 98%

## 2014-02-03 NOTE — Assessment & Plan Note (Signed)
Near end stage DJD, not severe enough for surgury yet, for tramadol prn

## 2014-02-03 NOTE — Assessment & Plan Note (Signed)

## 2014-02-03 NOTE — Patient Instructions (Addendum)
Please continue all other medications as before, and refills have been done if requested. Please have the pharmacy call with any other refills you may need.  Please continue your efforts at being more active, low cholesterol diet, and weight control. You are otherwise up to date with prevention measures today.  Please keep your appointments with your specialists as you have planned - GYN and orthopedic and dermatology  You are given the copy of your blood work today  Please remember to sign up for MyChart if you have not done so, as this will be important to you in the future with finding out test results, communicating by private email, and scheduling acute appointments online when needed.  Please return in 6 months, or sooner if needed

## 2014-02-03 NOTE — Progress Notes (Signed)
Pre visit review using our clinic review tool, if applicable. No additional management support is needed unless otherwise documented below in the visit note. 

## 2014-02-03 NOTE — Progress Notes (Signed)
Subjective:    Patient ID: Tanya [name], female    DOB: 1956-04-03, 58 y.o.   MRN: [id number]  HPI  Here for wellness and f/u;  Overall doing ok;  Pt denies CP, worsening SOB, DOE, wheezing, orthopnea, PND, worsening LE edema, palpitations, dizziness or syncope.  Pt denies neurological change such as new headache, facial or extremity weakness.  Pt denies polydipsia, polyuria, or low sugar symptoms. Pt states overall good compliance with treatment and medications, good tolerability, and has been trying to follow lower cholesterol diet.  Pt denies worsening depressive symptoms, suicidal ideation or panic. No fever, night sweats, wt loss, loss of appetite, or other constitutional symptoms.  Pt states good ability with ADL's, has low fall risk, home safety reviewed and adequate, no other significant changes in hearing or vision, and only occasionally active with exercise.  Still needs tramadol for chronic right knee pain, now left knee improved s/p recent left TKA. Had superficial melanoma #2 removed upper mid chest a few months ago. Sees derm every 4 mo.  No acute complaints Past Medical History  Diagnosis Date  . GLUCOSE INTOLERANCE 01/11/2008  . OBSTRUCTIVE SLEEP APNEA 09/07/2007  . Blepharospasm 10/09/2010  . HYPERTENSION 09/07/2007  . ASTHMATIC BRONCHITIS, ACUTE 03/19/2009  . ALLERGIC RHINITIS 01/11/2008  . ASTHMA 09/07/2007  . OSTEOARTHRITIS 09/07/2007  . Impaired glucose tolerance 03/16/2011  . Melanoma of multiple sites 02/03/2014   Past Surgical History  Procedure Laterality Date  . Knee sx bilateral    . Tailor bunion removal    . Hammer toe surgery    . Open anterior shoulder reconstruction    . Esophagogastroduodenoscopy  10/14/2004  . Total knee arthroplasty Left 12/29/2013    Procedure: LEFT TOTAL KNEE ARTHROPLASTY;  Surgeon: Johnn Hai, MD;  Location: [facility];  Service: Orthopedics;  Laterality: Left;    reports that she quit smoking about 26 years ago. Her smoking use  included Cigarettes. She has a 20 pack-year smoking history. She does not have any smokeless tobacco history on file. She reports that she drinks about 1.2 ounces of alcohol per week. She reports that she does not use illicit drugs. family history includes Asthma in her other; Diabetes in her other; Esophageal cancer in her father; Melanoma in her sister; Ovarian cancer in her other. Allergies  Allergen Reactions  . Ace Inhibitors     hives  . Penicillins     REACTION: hives  . Pseudoephedrine-Codeine     REACTION: hives   Current Outpatient Prescriptions on File Prior to Visit  Medication Sig Dispense Refill  . docusate sodium (COLACE) 100 MG capsule Take 1 capsule (100 mg total) by mouth 2 (two) times daily.  15 capsule  1  . Glucosamine-Chondroit-Vit C-Mn (GLUCOSAMINE 1500 COMPLEX PO) Take 1 tablet by mouth 2 (two) times daily.       . methocarbamol (ROBAXIN) 500 MG tablet Take 1 tablet (500 mg total) by mouth 3 (three) times daily between meals as needed for muscle spasms.  40 tablet  1  . Multiple Vitamin (MULTIVITAMIN WITH MINERALS) TABS tablet Take 1 tablet by mouth daily.      Marland Kitchen oxyCODONE-acetaminophen (PERCOCET) 7.5-325 MG per tablet Take 1-2 tablets by mouth every 4 (four) hours as needed for pain.  60 tablet  0  . rivaroxaban (XARELTO) 10 MG TABS tablet Take 1 tablet (10 mg total) by mouth daily.  21 tablet  0   No current facility-administered medications on file prior to visit.  Review of Systems Constitutional: Negative for diaphoresis, activity change, appetite change or unexpected weight change.  HENT: Negative for hearing loss, ear pain, facial swelling, mouth sores and neck stiffness.   Eyes: Negative for pain, redness and visual disturbance.  Respiratory: Negative for shortness of breath and wheezing.   Cardiovascular: Negative for chest pain and palpitations.  Gastrointestinal: Negative for diarrhea, blood in stool, abdominal distention or other pain Genitourinary:  Negative for hematuria, flank pain or change in urine volume.  Musculoskeletal: Negative for myalgias and joint swelling.  Skin: Negative for color change and wound.  Neurological: Negative for syncope and numbness. other than noted Hematological: Negative for adenopathy.  Psychiatric/Behavioral: Negative for hallucinations, self-injury, decreased concentration and agitation.      Objective:   Physical Exam BP 122/80  Pulse 79  Temp(Src) 98.7 F (37.1 C) (Oral)  Ht 5\' 7"  (1.702 m)  Wt 248 lb (112.492 kg)  BMI 38.83 kg/m2  SpO2 97% VS noted,  Not ill appearing Constitutional: Pt is oriented to person, place, and time. Appears well-developed and well-nourished.  Head: Normocephalic and atraumatic.  Right Ear: External ear normal.  Left Ear: External ear normal.  Nose: Nose normal.  Mouth/Throat: Oropharynx is clear and moist.  Eyes: Conjunctivae and EOM are normal. Pupils are equal, round, and reactive to light.  Neck: Normal range of motion. Neck supple. No JVD present. No tracheal deviation present.  Cardiovascular: Normal rate, regular rhythm, normal heart sounds and intact distal pulses.   Pulmonary/Chest: Effort normal and breath sounds normal.  Abdominal: Soft. Bowel sounds are normal. There is no tenderness. No HSM  Musculoskeletal: Normal range of motion. Exhibits no edema.  Lymphadenopathy:  Has no cervical adenopathy.  Neurological: Pt is alert and oriented to person, place, and time. Pt has normal reflexes. No cranial nerve deficit.  Skin: Skin is warm and dry. No rash noted.  Psychiatric:  Has  normal mood and affect. Behavior is normal.     Assessment & Plan:

## 2014-02-03 NOTE — Assessment & Plan Note (Signed)
Asympt, cont wt loss efforts

## 2014-02-06 ENCOUNTER — Telehealth: Payer: Self-pay | Admitting: Internal Medicine

## 2014-02-06 NOTE — Telephone Encounter (Signed)
Relevant patient education assigned to patient using Emmi. ° °

## 2014-02-09 ENCOUNTER — Encounter: Payer: Self-pay | Admitting: Pulmonary Disease

## 2014-02-09 ENCOUNTER — Ambulatory Visit (INDEPENDENT_AMBULATORY_CARE_PROVIDER_SITE_OTHER): Payer: Managed Care, Other (non HMO) | Admitting: Pulmonary Disease

## 2014-02-09 VITALS — BP 134/82 | HR 77 | Ht 67.0 in | Wt 249.8 lb

## 2014-02-09 DIAGNOSIS — J45909 Unspecified asthma, uncomplicated: Secondary | ICD-10-CM

## 2014-02-09 DIAGNOSIS — G4733 Obstructive sleep apnea (adult) (pediatric): Secondary | ICD-10-CM

## 2014-02-09 NOTE — Patient Instructions (Signed)
We will keep you on autoCPAP settings Stay on singulair

## 2014-02-09 NOTE — Progress Notes (Signed)
   Subjective:    Patient ID: Tanya [name], female    DOB: 06-12-1956, 58 y.o.   MRN: [id number]  HPI  58/F ex smoker for fu of obstructive sleep apnea .  She moved from [place] to [place]. Due to excessive daytime somnolence & loud snoring she had a sleep study done in [place] in 1997/98 @ [facility] (not available) , was told she had severe obstructive sleep apnea & set up on nasal CPAP ? 5 cm- supplier is [facility].  Asthma was diagnosed as a child , she grew out of this as an adult & is now back to using pro-air , 2 cannisters/ year.   06/2012-CPAP pressure is 11 cm.  She was supplied with new CPAP    She uses a mouth guard for bruxism, c/o dry mouth in am  DME- [facility] pt Download on auto 5-12 3/14 - good usage,avg pr 13 cm  nasal pillows ,Tried full face mask - leak bothered her Download 07/2013-01/2014 - no residuals, mild leak Melanoma on sternum removed Lt TKR No albuterol use x 58yr  Chief Complaint  Patient presents with  . Follow-up    pt wearing cpap 4-8 hours nightly.  Pt has no complaints with the supplies at this time.       Review of Systems neg for any significant sore throat, dysphagia, itching, sneezing, nasal congestion or excess/ purulent secretions, fever, chills, sweats, unintended wt loss, pleuritic or exertional cp, hempoptysis, orthopnea pnd or change in chronic leg swelling. Also denies presyncope, palpitations, heartburn, abdominal pain, nausea, vomiting, diarrhea or change in bowel or urinary habits, dysuria,hematuria, rash, arthralgias, visual complaints, headache, numbness weakness or ataxia.     Objective:   Physical Exam  Gen. Pleasant, obese, in no distress ENT - no lesions, no post nasal drip Neck: No JVD, no thyromegaly, no carotid bruits Lungs: no use of accessory muscles, no dullness to percussion, decreased without rales or rhonchi  Cardiovascular: Rhythm regular, heart sounds  normal, no murmurs or gallops, no  peripheral edema Musculoskeletal: No deformities, no cyanosis or clubbing , no tremors       Assessment & Plan:

## 2014-02-13 NOTE — Assessment & Plan Note (Signed)
We will keep you on autoCPAP settings  Weight loss encouraged, compliance with goal of at least 4-6 hrs every night is the expectation. Advised against medications with sedative side effects Cautioned against driving when sleepy - understanding that sleepiness will vary on a day to day basis

## 2014-02-13 NOTE — Assessment & Plan Note (Signed)
Stay on singulair albuteorl prn only

## 2014-03-28 ENCOUNTER — Other Ambulatory Visit: Payer: Self-pay | Admitting: Internal Medicine

## 2014-07-07 ENCOUNTER — Other Ambulatory Visit: Payer: Self-pay | Admitting: Internal Medicine

## 2014-08-03 ENCOUNTER — Other Ambulatory Visit: Payer: Self-pay | Admitting: Internal Medicine

## 2014-08-03 NOTE — Telephone Encounter (Signed)
Faxed hardcopy for Tramadol to [facility]

## 2014-08-03 NOTE — Telephone Encounter (Signed)
Done hardcopy to robin  

## 2014-08-19 ENCOUNTER — Encounter: Payer: Self-pay | Admitting: Internal Medicine

## 2014-10-13 ENCOUNTER — Encounter: Payer: Self-pay | Admitting: Internal Medicine

## 2015-01-22 ENCOUNTER — Other Ambulatory Visit: Payer: Self-pay | Admitting: Internal Medicine

## 2015-02-02 ENCOUNTER — Other Ambulatory Visit: Payer: Self-pay

## 2015-02-02 ENCOUNTER — Other Ambulatory Visit: Payer: Managed Care, Other (non HMO)

## 2015-02-02 ENCOUNTER — Telehealth: Payer: Self-pay | Admitting: Internal Medicine

## 2015-02-02 DIAGNOSIS — Z0000 Encounter for general adult medical examination without abnormal findings: Secondary | ICD-10-CM

## 2015-02-03 ENCOUNTER — Other Ambulatory Visit: Payer: Self-pay | Admitting: Internal Medicine

## 2015-02-05 ENCOUNTER — Other Ambulatory Visit (INDEPENDENT_AMBULATORY_CARE_PROVIDER_SITE_OTHER): Payer: Managed Care, Other (non HMO)

## 2015-02-05 DIAGNOSIS — Z0000 Encounter for general adult medical examination without abnormal findings: Secondary | ICD-10-CM

## 2015-02-05 LAB — BASIC METABOLIC PANEL
BUN: 11 mg/dL (ref 6–23)
CHLORIDE: 106 meq/L (ref 96–112)
CO2: 27 mEq/L (ref 19–32)
CREATININE: 0.73 mg/dL (ref 0.40–1.20)
Calcium: 9.8 mg/dL (ref 8.4–10.5)
GFR: 87.34 mL/min (ref >60.00)
GLUCOSE: 127 mg/dL — AB (ref 70–99)
Potassium: 4.5 mEq/L (ref 3.5–5.1)
Sodium: 139 mEq/L (ref 135–145)

## 2015-02-05 LAB — CBC WITH DIFFERENTIAL/PLATELET
BASOS PCT: 0.2 % (ref 0.0–3.0)
Basophils Absolute: 0 10*3/uL (ref 0.0–0.1)
EOS PCT: 1.4 % (ref 0.0–5.0)
Eosinophils Absolute: 0.1 10*3/uL (ref 0.0–0.7)
HCT: 43.3 % (ref 36.0–46.0)
Hemoglobin: 14.7 g/dL (ref 12.0–15.0)
Lymphocytes Relative: 31.2 % (ref 12.0–46.0)
Lymphs Abs: 2.2 10*3/uL (ref 0.7–4.0)
MCHC: 34 g/dL (ref 30.0–36.0)
MCV: 87.3 fl (ref 78.0–100.0)
MONO ABS: 0.4 10*3/uL (ref 0.1–1.0)
MONOS PCT: 5.3 % (ref 3.0–12.0)
Neutro Abs: 4.3 10*3/uL (ref 1.4–7.7)
Neutrophils Relative %: 61.9 % (ref 43.0–77.0)
PLATELETS: 202 10*3/uL (ref 150.0–400.0)
RBC: 4.96 Mil/uL (ref 3.87–5.11)
RDW: 13.7 % (ref 11.5–15.5)
WBC: 6.9 10*3/uL (ref 4.0–10.5)

## 2015-02-05 LAB — LIPID PANEL
CHOL/HDL RATIO: 2
Cholesterol: 114 mg/dL (ref 0–200)
HDL: 50.5 mg/dL (ref >39.00)
LDL CALC: 46 mg/dL (ref 0–99)
NonHDL: 63.5
Triglycerides: 87 mg/dL (ref 0.0–149.0)
VLDL: 17.4 mg/dL (ref 0.0–40.0)

## 2015-02-05 LAB — URINALYSIS, ROUTINE W REFLEX MICROSCOPIC
Bilirubin Urine: NEGATIVE
Hgb urine dipstick: NEGATIVE
KETONES UR: NEGATIVE
LEUKOCYTES UA: NEGATIVE
Nitrite: NEGATIVE
RBC / HPF: NONE SEEN (ref 0–?)
Specific Gravity, Urine: 1.025 (ref 1.000–1.030)
Total Protein, Urine: NEGATIVE
Urine Glucose: NEGATIVE
Urobilinogen, UA: 0.2 (ref 0.0–1.0)
pH: 6 (ref 5.0–8.0)

## 2015-02-05 LAB — HEPATIC FUNCTION PANEL
ALBUMIN: 4.2 g/dL (ref 3.5–5.2)
ALT: 25 U/L (ref 0–35)
AST: 18 U/L (ref 0–37)
Alkaline Phosphatase: 73 U/L (ref 39–117)
BILIRUBIN TOTAL: 0.5 mg/dL (ref 0.2–1.2)
Bilirubin, Direct: 0.1 mg/dL (ref 0.0–0.3)
Total Protein: 6.7 g/dL (ref 6.0–8.3)

## 2015-02-05 LAB — TSH: TSH: 0.38 u[IU]/mL (ref 0.35–4.50)

## 2015-02-06 NOTE — Telephone Encounter (Signed)
Done hardcopy to [facility]  

## 2015-02-06 NOTE — Telephone Encounter (Signed)
Rx sent to pharmacy   

## 2015-02-07 ENCOUNTER — Ambulatory Visit (INDEPENDENT_AMBULATORY_CARE_PROVIDER_SITE_OTHER): Payer: Managed Care, Other (non HMO) | Admitting: Pulmonary Disease

## 2015-02-07 ENCOUNTER — Encounter: Payer: Self-pay | Admitting: Internal Medicine

## 2015-02-07 ENCOUNTER — Encounter: Payer: Self-pay | Admitting: Pulmonary Disease

## 2015-02-07 ENCOUNTER — Ambulatory Visit (INDEPENDENT_AMBULATORY_CARE_PROVIDER_SITE_OTHER): Payer: Managed Care, Other (non HMO) | Admitting: Internal Medicine

## 2015-02-07 VITALS — BP 118/76 | HR 65 | Temp 99.1°F | Resp 20 | Ht 67.0 in | Wt 256.1 lb

## 2015-02-07 VITALS — BP 100/68 | HR 78 | Ht 67.0 in | Wt 256.8 lb

## 2015-02-07 DIAGNOSIS — R7302 Impaired glucose tolerance (oral): Secondary | ICD-10-CM

## 2015-02-07 DIAGNOSIS — Z0000 Encounter for general adult medical examination without abnormal findings: Secondary | ICD-10-CM

## 2015-02-07 DIAGNOSIS — G4733 Obstructive sleep apnea (adult) (pediatric): Secondary | ICD-10-CM

## 2015-02-07 DIAGNOSIS — J4520 Mild intermittent asthma, uncomplicated: Secondary | ICD-10-CM

## 2015-02-07 MED ORDER — VALSARTAN 160 MG PO TABS: 160.0000 mg | ORAL_TABLET | Freq: Every morning | ORAL | Status: DC

## 2015-02-07 MED ORDER — MONTELUKAST SODIUM 10 MG PO TABS: 10.0000 mg | ORAL_TABLET | ORAL | Status: DC

## 2015-02-07 MED ORDER — CALCIUM ACETATE 667 MG PO CAPS: 667.0000 mg | ORAL_CAPSULE | Freq: Every day | ORAL | Status: DC

## 2015-02-07 MED ORDER — AMLODIPINE BESYLATE 10 MG PO TABS: 10.0000 mg | ORAL_TABLET | Freq: Every morning | ORAL | Status: DC

## 2015-02-07 NOTE — Assessment & Plan Note (Signed)
Mild, for wt loss, diet efforts

## 2015-02-07 NOTE — Progress Notes (Signed)
   Subjective:    Patient ID: Tanya [name], female    DOB: Feb 09, 1956, 59 y.o.   MRN: [id number]  HPI  59/F ex smoker for fu of obstructive sleep apnea & mild int asthma .  She moved from [place] to [place]. Due to excessive daytime somnolence & loud snoring she had a sleep study done in [place] in 1997/98 @ [facility] (not available) , was told she had severe obstructive sleep apnea & set up on nasal CPAP ? 5 cm- supplier is [facility].  Asthma was diagnosed as a child , she grew out of this as an adult & is now back to using pro-air , 2 cannisters/ year.   06/2012-CPAP pressure is 11 cm.  She was supplied with new CPAP    She uses a mouth guard for bruxism, c/o dry mouth in am  DME- [facility] Download on auto 5-12 3/14 - good usage,avg pr 13 cm  nasal pillows ,Tried full face mask - leak bothered her Download 07/2013-01/2014 - no residuals, mild leak  02/07/2015  Chief Complaint  Patient presents with  . Follow-up    OSA: Wears CPAP every night, mask is making a whistling sound.  no concerns.    No albuterol use x 81yr, no noct  Wheeze Mask ok, occ leak, no dryness, pr ok   Review of Systems neg for any significant sore throat, dysphagia, itching, sneezing, nasal congestion or excess/ purulent secretions, fever, chills, sweats, unintended wt loss, pleuritic or exertional cp, hempoptysis, orthopnea pnd or change in chronic leg swelling. Also denies presyncope, palpitations, heartburn, abdominal pain, nausea, vomiting, diarrhea or change in bowel or urinary habits, dysuria,hematuria, rash, arthralgias, visual complaints, headache, numbness weakness or ataxia.     Objective:   Physical Exam  Gen. Pleasant, well-nourished, in no distress ENT - no lesions, no post nasal drip, class 2 airway Neck: No JVD, no thyromegaly, no carotid bruits Lungs: no use of accessory muscles, no dullness to percussion, clear without rales or rhonchi  Cardiovascular: Rhythm regular,  heart sounds  normal, no murmurs or gallops, no peripheral edema Musculoskeletal: No deformities, no cyanosis or clubbing        Assessment & Plan:

## 2015-02-07 NOTE — Patient Instructions (Signed)
CPAP supplies will be renewed x 1 yr Trial of nasal pillows Call as needed

## 2015-02-07 NOTE — Assessment & Plan Note (Signed)

## 2015-02-07 NOTE — Progress Notes (Signed)
Subjective:    Patient ID: Tanya [name], female    DOB: [birth year], 59 y.o.   MRN: [id number]  HPI  Here for wellness and f/u;  Overall doing ok;  Pt denies Chest pain, worsening SOB, DOE, wheezing, orthopnea, PND, worsening LE edema, palpitations, dizziness or syncope.  Pt denies neurological change such as new headache, facial or extremity weakness.  Pt denies polydipsia, polyuria, or low sugar symptoms. Pt states overall good compliance with treatment and medications, good tolerability, and has been trying to follow appropriate diet.  Pt denies worsening depressive symptoms, suicidal ideation or panic. No fever, night sweats, wt loss, loss of appetite, or other constitutional symptoms.  Pt states good ability with ADL's, has low fall risk, home safety reviewed and adequate, no other significant changes in hearing or vision, and only occasionally active with exercise. Pap smear and mamogram sched for tomorrow. No current complaints. Had uncomplicated left knee TKR in 2015 Past Medical History  Diagnosis Date  . GLUCOSE INTOLERANCE 01/11/2008  . OBSTRUCTIVE SLEEP APNEA 09/07/2007  . Blepharospasm 10/09/2010  . HYPERTENSION 09/07/2007  . ASTHMATIC BRONCHITIS, ACUTE 03/19/2009  . ALLERGIC RHINITIS 01/11/2008  . ASTHMA 09/07/2007  . OSTEOARTHRITIS 09/07/2007  . Impaired glucose tolerance 03/16/2011  . Melanoma of multiple sites 02/03/2014   Past Surgical History  Procedure Laterality Date  . Knee sx bilateral    . Tailor bunion removal    . Hammer toe surgery    . Open anterior shoulder reconstruction    . Esophagogastroduodenoscopy  10/14/2004  . Total knee arthroplasty Left 12/29/2013    Procedure: LEFT TOTAL KNEE ARTHROPLASTY;  Surgeon: Johnn Hai, MD;  Location: [facility];  Service: Orthopedics;  Laterality: Left;    reports that she quit smoking about 27 years ago. Her smoking use included Cigarettes. She has a 20 pack-year smoking history. She does not have any smokeless tobacco  history on file. She reports that she drinks about 1.2 oz of alcohol per week. She reports that she does not use illicit drugs. family history includes Asthma in her other; Diabetes in her other; Esophageal cancer in her father; Melanoma in her sister; Ovarian cancer in her other. Allergies  Allergen Reactions  . Ace Inhibitors     hives  . Penicillins     REACTION: hives  . Pseudoephedrine-Codeine     REACTION: hives   Current Outpatient Prescriptions on File Prior to Visit  Medication Sig Dispense Refill  . calcium acetate (PHOSLO) 667 MG capsule TAKE 1 CAPSULE (667 MG TOTAL) BY MOUTH DAILY. 90 capsule 3  . celecoxib (CELEBREX) 200 MG capsule TAKE 1 CAPSULE (200 MG TOTAL) BY MOUTH 2 (TWO) TIMES DAILY. 180 capsule 0  . cetirizine (ZYRTEC) 10 MG tablet TAKE 1 TABLET (10 MG TOTAL) BY MOUTH DAILY. 90 tablet 3  . Glucosamine-Chondroit-Vit C-Mn (GLUCOSAMINE 1500 COMPLEX PO) Take 1 tablet by mouth 2 (two) times daily.     . methocarbamol (ROBAXIN) 500 MG tablet Take 1 tablet (500 mg total) by mouth 3 (three) times daily between meals as needed for muscle spasms. 40 tablet 1  . Multiple Vitamin (MULTIVITAMIN WITH MINERALS) TABS tablet Take 1 tablet by mouth daily.    Marland Kitchen oxyCODONE-acetaminophen (PERCOCET) 7.5-325 MG per tablet Take 1-2 tablets by mouth every 4 (four) hours as needed for pain. 60 tablet 0  . traMADol (ULTRAM-ER) 200 MG 24 hr tablet TAKE 1 TABLET BY MOUTH EVERY MORNING 90 tablet 0   No current facility-administered medications on file prior  to visit.     Review of Systems Constitutional: Negative for increased diaphoresis, other activity, appetite or siginficant weight change other than noted HENT: Negative for worsening hearing loss, ear pain, facial swelling, mouth sores and neck stiffness.   Eyes: Negative for other worsening pain, redness or visual disturbance.  Respiratory: Negative for shortness of breath and wheezing  Cardiovascular: Negative for chest pain and  palpitations.  Gastrointestinal: Negative for diarrhea, blood in stool, abdominal distention or other pain Genitourinary: Negative for hematuria, flank pain or change in urine volume.  Musculoskeletal: Negative for myalgias or other joint complaints.  Skin: Negative for color change and wound or drainage.  Neurological: Negative for syncope and numbness. other than noted Hematological: Negative for adenopathy. or other swelling Psychiatric/Behavioral: Negative for hallucinations, SI, self-injury, decreased concentration or other worsening agitation.      Objective:   Physical Exam BP 118/76 mmHg  Pulse 65  Temp(Src) 99.1 F (37.3 C) (Oral)  Resp 20  Ht 5\' 7"  (1.702 m)  Wt 256 lb 1.9 oz (116.175 kg)  BMI 40.10 kg/m2  SpO2 97% VS noted,  Constitutional: Pt is oriented to person, place, and time. Appears well-developed and well-nourished, in no significant distress/obese Head: Normocephalic and atraumatic.  Right Ear: External ear normal.  Left Ear: External ear normal.  Nose: Nose normal.  Mouth/Throat: Oropharynx is clear and moist.  Eyes: Conjunctivae and EOM are normal. Pupils are equal, round, and reactive to light.  Neck: Normal range of motion. Neck supple. No JVD present. No tracheal deviation present or significant neck LA or mass Cardiovascular: Normal rate, regular rhythm, normal heart sounds and intact distal pulses.   Pulmonary/Chest: Effort normal and breath sounds without rales or wheezing  Abdominal: Soft. Bowel sounds are normal. NT. No HSM  Musculoskeletal: Normal range of motion. Exhibits no edema.  Lymphadenopathy:  Has no cervical adenopathy.  Neurological: Pt is alert and oriented to person, place, and time. Pt has normal reflexes. No cranial nerve deficit. Motor grossly intact Skin: Skin is warm and dry. No rash noted.  Psychiatric:  Has normal mood and affect. Behavior is normal.     Assessment & Plan:

## 2015-02-07 NOTE — Progress Notes (Signed)
Pre visit review using our clinic review tool, if applicable. No additional management support is needed unless otherwise documented below in the visit note. 

## 2015-02-07 NOTE — Patient Instructions (Signed)
Please continue all other medications as before, and refills have been done if requested.  Please have the pharmacy call with any other refills you may need.  Please continue your efforts at being more active, low cholesterol diet, and weight control.  You are otherwise up to date with prevention measures today.  Please keep your appointments with your specialists as you may have planned  Please return in 1 year for your yearly visit, or sooner if needed, with Lab testing done 3-5 days before  

## 2015-02-07 NOTE — Assessment & Plan Note (Signed)
Stable Albuterol prn only

## 2015-02-07 NOTE — Assessment & Plan Note (Signed)
CPAP supplies will be renewed x 1 yr Trial of nasal pillows  Weight loss encouraged, compliance with goal of at least 4-6 hrs every night is the expectation. Advised against medications with sedative side effects Cautioned against driving when sleepy - understanding that sleepiness will vary on a day to day basis  Call as needed

## 2015-02-08 ENCOUNTER — Telehealth: Payer: Self-pay | Admitting: Pulmonary Disease

## 2015-02-08 NOTE — Telephone Encounter (Signed)
Auto Average 12 No residuals Good usage.

## 2015-02-09 NOTE — Telephone Encounter (Signed)
lmtcb

## 2015-02-12 NOTE — Telephone Encounter (Signed)
lmtcb

## 2015-02-12 NOTE — Telephone Encounter (Signed)
lmtcb for pt.  

## 2015-02-12 NOTE — Telephone Encounter (Signed)
Patient notified.  Nothing further needed. 

## 2015-02-12 NOTE — Telephone Encounter (Signed)
Pt returned call - [phone]

## 2015-02-12 NOTE — Telephone Encounter (Signed)
[phone], pt cb

## 2015-02-21 ENCOUNTER — Encounter: Payer: Self-pay | Admitting: Pulmonary Disease

## 2015-02-21 ENCOUNTER — Telehealth: Payer: Self-pay | Admitting: Pulmonary Disease

## 2015-02-21 DIAGNOSIS — G4733 Obstructive sleep apnea (adult) (pediatric): Secondary | ICD-10-CM

## 2015-02-21 NOTE — Telephone Encounter (Signed)
Referral order has been faxed to [facility] Patient, spoke with pt to confirm dme  Co  which is [facility], not [facility]. [name]

## 2015-02-21 NOTE — Telephone Encounter (Signed)
Spoke with the pt  She states she was supposed to get her CPAP supplies renewed at last ov  Order was never sent  I apologized for the delay and have sent order to [facility]  Nothing further needed

## 2015-05-07 ENCOUNTER — Other Ambulatory Visit: Payer: Self-pay | Admitting: Internal Medicine

## 2015-05-07 NOTE — Telephone Encounter (Signed)
Waiting for a response from Dr. Jenny Reichmann. Would like his consent before sending in medications.

## 2015-05-07 NOTE — Telephone Encounter (Signed)
Patient has called in regarding these prescriptions. She states that Dr. Jenny Reichmann usually prescribes these for a whole year because she only comes in to see him once a year.

## 2015-05-07 NOTE — Telephone Encounter (Signed)
Please advise if these medications are ok to refill.

## 2015-05-08 NOTE — Telephone Encounter (Signed)
Done erx for celebrex  Ok for tramadol - Done hardcopy to [facility]

## 2015-05-08 NOTE — Telephone Encounter (Signed)
Rx faxed to pharmacy  

## 2015-08-03 ENCOUNTER — Other Ambulatory Visit: Payer: Self-pay | Admitting: Internal Medicine

## 2015-09-12 ENCOUNTER — Encounter: Payer: Self-pay | Admitting: Internal Medicine

## 2015-10-29 ENCOUNTER — Other Ambulatory Visit: Payer: Self-pay | Admitting: Internal Medicine

## 2016-01-07 ENCOUNTER — Other Ambulatory Visit: Payer: Self-pay

## 2016-01-07 MED ORDER — CETIRIZINE HCL 10 MG PO TABS: ORAL_TABLET | ORAL | Status: DC

## 2016-01-10 ENCOUNTER — Other Ambulatory Visit (INDEPENDENT_AMBULATORY_CARE_PROVIDER_SITE_OTHER): Payer: Managed Care, Other (non HMO)

## 2016-01-10 DIAGNOSIS — R7302 Impaired glucose tolerance (oral): Secondary | ICD-10-CM

## 2016-01-10 DIAGNOSIS — Z0000 Encounter for general adult medical examination without abnormal findings: Secondary | ICD-10-CM

## 2016-01-10 LAB — HEPATIC FUNCTION PANEL
ALK PHOS: 71 U/L (ref 39–117)
ALT: 25 U/L (ref 0–35)
AST: 18 U/L (ref 0–37)
Albumin: 4.3 g/dL (ref 3.5–5.2)
BILIRUBIN DIRECT: 0.1 mg/dL (ref 0.0–0.3)
BILIRUBIN TOTAL: 0.6 mg/dL (ref 0.2–1.2)
Total Protein: 7 g/dL (ref 6.0–8.3)

## 2016-01-10 LAB — URINALYSIS, ROUTINE W REFLEX MICROSCOPIC
BILIRUBIN URINE: NEGATIVE
Hgb urine dipstick: NEGATIVE
Ketones, ur: NEGATIVE
Leukocytes, UA: NEGATIVE
Nitrite: NEGATIVE
PH: 6 (ref 5.0–8.0)
RBC / HPF: NONE SEEN (ref 0–?)
SPECIFIC GRAVITY, URINE: 1.02 (ref 1.000–1.030)
TOTAL PROTEIN, URINE-UPE24: NEGATIVE
Urine Glucose: NEGATIVE
Urobilinogen, UA: 0.2 (ref 0.0–1.0)

## 2016-01-10 LAB — BASIC METABOLIC PANEL
BUN: 18 mg/dL (ref 6–23)
CO2: 27 meq/L (ref 19–32)
CREATININE: 0.67 mg/dL (ref 0.40–1.20)
Calcium: 9.9 mg/dL (ref 8.4–10.5)
Chloride: 107 mEq/L (ref 96–112)
GFR: 96.11 mL/min (ref >60.00)
Glucose, Bld: 115 mg/dL — ABNORMAL HIGH (ref 70–99)
Potassium: 4.4 mEq/L (ref 3.5–5.1)
SODIUM: 143 meq/L (ref 135–145)

## 2016-01-10 LAB — LIPID PANEL
CHOL/HDL RATIO: 2
Cholesterol: 126 mg/dL (ref 0–200)
HDL: 56.5 mg/dL (ref >39.00)
LDL CALC: 58 mg/dL (ref 0–99)
NONHDL: 69.48
Triglycerides: 55 mg/dL (ref 0.0–149.0)
VLDL: 11 mg/dL (ref 0.0–40.0)

## 2016-01-10 LAB — CBC WITH DIFFERENTIAL/PLATELET
BASOS PCT: 0.5 % (ref 0.0–3.0)
Basophils Absolute: 0 10*3/uL (ref 0.0–0.1)
Eosinophils Absolute: 0.1 10*3/uL (ref 0.0–0.7)
Eosinophils Relative: 1.9 % (ref 0.0–5.0)
HEMATOCRIT: 42.2 % (ref 36.0–46.0)
Hemoglobin: 14.3 g/dL (ref 12.0–15.0)
LYMPHS ABS: 1.8 10*3/uL (ref 0.7–4.0)
Lymphocytes Relative: 31.6 % (ref 12.0–46.0)
MCHC: 33.9 g/dL (ref 30.0–36.0)
MCV: 88 fl (ref 78.0–100.0)
Monocytes Absolute: 0.4 10*3/uL (ref 0.1–1.0)
Monocytes Relative: 6.4 % (ref 3.0–12.0)
Neutro Abs: 3.3 10*3/uL (ref 1.4–7.7)
Neutrophils Relative %: 59.6 % (ref 43.0–77.0)
Platelets: 190 10*3/uL (ref 150.0–400.0)
RBC: 4.8 Mil/uL (ref 3.87–5.11)
RDW: 13.2 % (ref 11.5–15.5)
WBC: 5.6 10*3/uL (ref 4.0–10.5)

## 2016-01-10 LAB — TSH: TSH: 0.55 u[IU]/mL (ref 0.35–4.50)

## 2016-01-10 LAB — HEMOGLOBIN A1C: HEMOGLOBIN A1C: 6.4 % (ref 4.6–6.5)

## 2016-01-26 ENCOUNTER — Other Ambulatory Visit: Payer: Self-pay | Admitting: Internal Medicine

## 2016-02-07 ENCOUNTER — Other Ambulatory Visit: Payer: Self-pay | Admitting: Internal Medicine

## 2016-02-07 MED ORDER — TRAMADOL HCL ER 200 MG PO TB24: ORAL_TABLET | ORAL | Status: DC

## 2016-02-07 NOTE — Telephone Encounter (Signed)
Done hardcopy to Corinne  I can only give one month at this time as pt is due for ROV  Please ask pt for ROV

## 2016-02-07 NOTE — Telephone Encounter (Signed)
Please advise, ok to refill?

## 2016-02-08 ENCOUNTER — Ambulatory Visit (INDEPENDENT_AMBULATORY_CARE_PROVIDER_SITE_OTHER): Payer: Managed Care, Other (non HMO) | Admitting: Internal Medicine

## 2016-02-08 ENCOUNTER — Encounter: Payer: Self-pay | Admitting: Internal Medicine

## 2016-02-08 VITALS — BP 130/80 | HR 76 | Temp 97.7°F | Resp 20 | Wt 245.0 lb

## 2016-02-08 DIAGNOSIS — I10 Essential (primary) hypertension: Secondary | ICD-10-CM | POA: Diagnosis not present

## 2016-02-08 DIAGNOSIS — R7302 Impaired glucose tolerance (oral): Secondary | ICD-10-CM | POA: Diagnosis not present

## 2016-02-08 DIAGNOSIS — Z0000 Encounter for general adult medical examination without abnormal findings: Secondary | ICD-10-CM

## 2016-02-08 DIAGNOSIS — J4520 Mild intermittent asthma, uncomplicated: Secondary | ICD-10-CM

## 2016-02-08 DIAGNOSIS — Z1159 Encounter for screening for other viral diseases: Secondary | ICD-10-CM

## 2016-02-08 DIAGNOSIS — M5412 Radiculopathy, cervical region: Secondary | ICD-10-CM | POA: Insufficient documentation

## 2016-02-08 NOTE — Assessment & Plan Note (Signed)
stable overall by history and exam, recent data reviewed with pt, and pt to continue medical treatment as before,  to f/u any worsening symptoms or concerns BP Readings from Last 3 Encounters:  02/08/16 130/80  02/07/15 100/68  02/07/15 118/76

## 2016-02-08 NOTE — Progress Notes (Signed)
Pre visit review using our clinic review tool, if applicable. No additional management support is needed unless otherwise documented below in the visit note. 

## 2016-02-08 NOTE — Assessment & Plan Note (Signed)

## 2016-02-08 NOTE — Patient Instructions (Addendum)
You had the Tdap Tetanus shot today  You had the Prevnar 13 pneumonia shot today  Your EKG was [facility] today, as well as the lab work  Please continue all other medications as before, and refills have been done if requested.  Please have the pharmacy call with any other refills you may need.  Please continue your efforts at being more active, low cholesterol diet, and weight control.  You are otherwise up to date with prevention measures today.  Please keep your appointments with your specialists as you may have planned  Please return in 1 year for your yearly visit, or sooner if needed, with Lab testing done 3-5 days before

## 2016-02-08 NOTE — Assessment & Plan Note (Signed)
stable overall by history and exam, recent data reviewed with pt, and pt to continue medical treatment as before,  to f/u any worsening symptoms or concerns le SpO2 Readings from Last 3 Encounters:  02/08/16 98%  02/07/15 97%  02/07/15 97%
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
## 2016-02-08 NOTE — Assessment & Plan Note (Signed)
stable overall by history and exam, recent data reviewed with pt, and pt to continue medical treatment as before,  to f/u any worsening symptoms or concerns Lab Results  Component Value Date   HGBA1C 6.4 01/10/2016    to cont wt loss efforts

## 2016-02-11 ENCOUNTER — Ambulatory Visit: Payer: Managed Care, Other (non HMO) | Admitting: Adult Health

## 2016-02-11 ENCOUNTER — Other Ambulatory Visit: Payer: Self-pay | Admitting: Internal Medicine

## 2016-02-11 MED ORDER — CETIRIZINE HCL 10 MG PO TABS: ORAL_TABLET | ORAL | Status: DC

## 2016-02-11 MED ORDER — VALSARTAN 160 MG PO TABS: 160.0000 mg | ORAL_TABLET | Freq: Every morning | ORAL | Status: DC

## 2016-02-11 MED ORDER — AMLODIPINE BESYLATE 10 MG PO TABS: 10.0000 mg | ORAL_TABLET | Freq: Every morning | ORAL | Status: DC

## 2016-02-11 NOTE — Telephone Encounter (Signed)
Pt left msg on triage stating had her CPX on Friday and her medicines was not refilled. She has been out of her pain medication and needing refill today. Per chart MD approved tramadol on 3/16, not sure what happen to hard script. Called refill into [facility] had to leave msg on pharmacy vm. Sent all other meds through electronically...Johny Chess

## 2016-03-09 ENCOUNTER — Other Ambulatory Visit: Payer: Self-pay | Admitting: Internal Medicine

## 2016-03-11 ENCOUNTER — Other Ambulatory Visit: Payer: Self-pay | Admitting: Internal Medicine

## 2016-03-11 NOTE — Telephone Encounter (Signed)
Done hardcopy to Corinne  

## 2016-03-11 NOTE — Telephone Encounter (Signed)
Please advise is this ok to refill? °

## 2016-03-12 NOTE — Telephone Encounter (Signed)
Medication sent.

## 2016-03-31 ENCOUNTER — Encounter (HOSPITAL_BASED_OUTPATIENT_CLINIC_OR_DEPARTMENT_OTHER): Payer: Self-pay | Admitting: *Deleted

## 2016-04-01 ENCOUNTER — Encounter (HOSPITAL_BASED_OUTPATIENT_CLINIC_OR_DEPARTMENT_OTHER): Payer: Self-pay | Admitting: *Deleted

## 2016-04-01 NOTE — Progress Notes (Signed)
To [facility] at [phone] on arrival-will call if able to come prior to DOS for pre op lab work-Ekg w/chart -instructed Npo after Mn-will take amlodipine,singular zyrtec with small amt water in am.

## 2016-04-06 NOTE — Anesthesia Preprocedure Evaluation (Addendum)
Anesthesia Evaluation  Patient identified by MRN, date of birth, ID band Patient awake    Reviewed: Allergy & Precautions, NPO status , Patient's Chart, lab work & pertinent test results  History of Anesthesia Complications (+) PONV and history of anesthetic complications  Airway Mallampati: II  TM Distance: >3 FB Neck ROM: Full    Dental  (+) Teeth Intact, Dental Advisory Given,  Lower right permanent bridge:   Pulmonary asthma , sleep apnea , former smoker,    Pulmonary exam normal breath sounds clear to auscultation       Cardiovascular hypertension, Pt. on medications Normal cardiovascular exam Rhythm:Regular Rate:Normal     Neuro/Psych  Neuromuscular disease negative psych ROS   GI/Hepatic negative GI ROS, Neg liver ROS,   Endo/Other  Obesity   Renal/GU negative Renal ROS  Female GU complaint (PMB)     Musculoskeletal negative musculoskeletal ROS (+) Arthritis ,   Abdominal   Peds  Hematology negative hematology ROS (+)   Anesthesia Other Findings Day of surgery medications reviewed with the patient.  Reproductive/Obstetrics                           Anesthesia Physical Anesthesia Plan  ASA: II  Anesthesia Plan: General   Post-op Pain Management:    Induction: Intravenous  Airway Management Planned: LMA  Additional Equipment:   Intra-op Plan:   Post-operative Plan: Extubation in OR  Informed Consent: I have reviewed the patients History and Physical, chart, labs and discussed the procedure including the risks, benefits and alternatives for the proposed anesthesia with the patient or authorized representative who has indicated his/her understanding and acceptance.   Dental advisory given  Plan Discussed with: CRNA  Anesthesia Plan Comments: (Risks/benefits of general anesthesia discussed with patient including risk of damage to teeth, lips, gum, and tongue,  nausea/vomiting, allergic reactions to medications, and the possibility of heart attack, stroke and death.  All patient questions answered.  Patient wishes to proceed.  Scopolamine patch preop, Zofran, Decadron, and TIVA intraop.)       Anesthesia Quick Evaluation

## 2016-04-06 NOTE — H&P (Signed)
Tanya [name] is an 60 y.o. female. She recently has postmenopausal bleeding, unable to do pipelle in the office due to a stenotic cervix.  Pelvic ultrasound is normal except for a thickened endometrium at 15 mm, normal ovaries.  She recently has right pelvic pain unexplained by this ultrasound.  Pertinent Gynecological History: Last mammogram: normal Date: 01/2016 Last pap: normal Date: 2015 OB History: G1, P0010   Menstrual History:  Patient's last menstrual period was 07/14/2013.    Past Medical History  Diagnosis Date  . Blepharospasm 10/09/2010  . Hypertension   . Mild intermittent asthma   . Allergic rhinitis   . OA (osteoarthritis)   . PMB (postmenopausal bleeding)   . History of melanoma excision     MULTIPLE SITES  . PONV (postoperative nausea and vomiting)     Past Surgical History  Procedure Laterality Date  . Tailor bunion removal    . Hammer toe surgery    . Esophagogastroduodenoscopy  10/14/2004  . Total knee arthroplasty Left 12/29/2013    Procedure: LEFT TOTAL KNEE ARTHROPLASTY;  Surgeon: Johnn Hai, MD;  Location: [facility];  Service: Orthopedics;  Laterality: Left;  . Knee arthroscopy Bilateral left 08-01-2005/  right  . Dilation and curettage of uterus    . Re-do rotator cuff repair/  sad/  acromioplasty Right 01-04-2009  . Shoulder arthroscopy w/ rotator cuff repair Right     Family History  Problem Relation Age of Onset  . Esophageal cancer Father   . Melanoma Sister   . Diabetes Other   . Ovarian cancer Other   . Asthma Other     siblings    Social History:  reports that she quit smoking about 28 years ago. Her smoking use included Cigarettes. She has a 20 pack-year smoking history. She does not have any smokeless tobacco history on file. She reports that she drinks about 1.2 oz of alcohol per week. She reports that she does not use illicit drugs.  Allergies:  Allergies  Allergen Reactions  . Ace Inhibitors Hives    hives  . Penicillins  Hives  . Pseudoephedrine-Codeine Hives    No prescriptions prior to admission    Review of Systems  Respiratory: Negative.   Cardiovascular: Negative.   Gastrointestinal: Negative.   Genitourinary: Negative.     Height 5\' 7"  (1.702 m), weight 111.131 kg (245 lb), last menstrual period 07/14/2013. Physical Exam  Constitutional: She appears well-developed and well-nourished.  Neck: Neck supple. No thyromegaly present.  Cardiovascular: Normal rate, regular rhythm and normal heart sounds.   No murmur heard. Respiratory: Effort normal and breath sounds normal. No respiratory distress. She has no wheezes.  GI: Soft. She exhibits no distension and no mass. There is no tenderness.  Genitourinary: Vagina normal and uterus normal.  No adnexal mass Cervix is stenotic    No results found for this or any previous visit (from the past 24 hour(s)).  No results found.  Assessment/Plan: Postmenopausal bleeding with stenotic cervix preventing endometrial sampling and thickened endometrium by pelvic ultrasound.  Discussed options.  Discussed surgical procedure and risks and chances of relieving symptoms and making diagnosis.  Will admit for outpatient hysteroscopy/D&C.    Lyna Laningham D 04/06/2016, 7:59 PM

## 2016-04-07 ENCOUNTER — Ambulatory Visit (HOSPITAL_BASED_OUTPATIENT_CLINIC_OR_DEPARTMENT_OTHER): Payer: Managed Care, Other (non HMO) | Admitting: Anesthesiology

## 2016-04-07 ENCOUNTER — Encounter (HOSPITAL_BASED_OUTPATIENT_CLINIC_OR_DEPARTMENT_OTHER)
Admission: RE | Disposition: A | Discharge status: Home / Self Care | Payer: Self-pay | Source: Ambulatory Visit | Attending: Obstetrics and Gynecology

## 2016-04-07 ENCOUNTER — Encounter (HOSPITAL_BASED_OUTPATIENT_CLINIC_OR_DEPARTMENT_OTHER): Payer: Self-pay | Admitting: *Deleted

## 2016-04-07 ENCOUNTER — Ambulatory Visit (HOSPITAL_BASED_OUTPATIENT_CLINIC_OR_DEPARTMENT_OTHER)
Admission: RE | Admit: 2016-04-07 | Discharge: 2016-04-07 | Disposition: A | Discharge status: Home / Self Care | Payer: Managed Care, Other (non HMO) | Source: Ambulatory Visit | Attending: Obstetrics and Gynecology | Admitting: Obstetrics and Gynecology

## 2016-04-07 DIAGNOSIS — I10 Essential (primary) hypertension: Secondary | ICD-10-CM | POA: Insufficient documentation

## 2016-04-07 DIAGNOSIS — N882 Stricture and stenosis of cervix uteri: Secondary | ICD-10-CM | POA: Diagnosis not present

## 2016-04-07 DIAGNOSIS — G4730 Sleep apnea, unspecified: Secondary | ICD-10-CM | POA: Diagnosis not present

## 2016-04-07 DIAGNOSIS — Z96652 Presence of left artificial knee joint: Secondary | ICD-10-CM | POA: Insufficient documentation

## 2016-04-07 DIAGNOSIS — Z87891 Personal history of nicotine dependence: Secondary | ICD-10-CM | POA: Diagnosis not present

## 2016-04-07 DIAGNOSIS — N840 Polyp of corpus uteri: Secondary | ICD-10-CM | POA: Insufficient documentation

## 2016-04-07 DIAGNOSIS — N950 Postmenopausal bleeding: Secondary | ICD-10-CM | POA: Diagnosis present

## 2016-04-07 DIAGNOSIS — Z79899 Other long term (current) drug therapy: Secondary | ICD-10-CM | POA: Diagnosis not present

## 2016-04-07 DIAGNOSIS — Z6838 Body mass index (BMI) 38.0-38.9, adult: Secondary | ICD-10-CM | POA: Insufficient documentation

## 2016-04-07 DIAGNOSIS — E669 Obesity, unspecified: Secondary | ICD-10-CM | POA: Insufficient documentation

## 2016-04-07 HISTORY — DX: Postmenopausal bleeding: N95.0

## 2016-04-07 HISTORY — DX: Essential (primary) hypertension: I10

## 2016-04-07 HISTORY — DX: Other specified postprocedural states: Z85.820

## 2016-04-07 HISTORY — DX: Other specified postprocedural states: Z98.890

## 2016-04-07 HISTORY — DX: Nausea with vomiting, unspecified: R11.2

## 2016-04-07 HISTORY — PX: HYSTEROSCOPY W/D&C: SHX1775

## 2016-04-07 HISTORY — DX: Unspecified osteoarthritis, unspecified site: M19.90

## 2016-04-07 HISTORY — DX: Mild intermittent asthma, uncomplicated: J45.20

## 2016-04-07 HISTORY — DX: Allergic rhinitis, unspecified: J30.9

## 2016-04-07 LAB — BASIC METABOLIC PANEL
ANION GAP: 5 (ref 5–15)
BUN: 20 mg/dL (ref 6–20)
CHLORIDE: 109 mmol/L (ref 101–111)
CO2: 25 mmol/L (ref 22–32)
Calcium: 9.1 mg/dL (ref 8.9–10.3)
Creatinine, Ser: 0.62 mg/dL (ref 0.44–1.00)
GFR calc non Af Amer: >60 mL/min (ref >60)
GLUCOSE: 135 mg/dL — AB (ref 65–99)
Potassium: 3.9 mmol/L (ref 3.5–5.1)
Sodium: 139 mmol/L (ref 135–145)

## 2016-04-07 LAB — CBC
HEMATOCRIT: 40.8 % (ref 36.0–46.0)
HEMOGLOBIN: 14 g/dL (ref 12.0–15.0)
MCH: 30 pg (ref 26.0–34.0)
MCHC: 34.3 g/dL (ref 30.0–36.0)
MCV: 87.4 fL (ref 78.0–100.0)
Platelets: 188 10*3/uL (ref 150–400)
RBC: 4.67 MIL/uL (ref 3.87–5.11)
RDW: 13.4 % (ref 11.5–15.5)
WBC: 6.5 10*3/uL (ref 4.0–10.5)

## 2016-04-07 SURGERY — DILATATION AND CURETTAGE /HYSTEROSCOPY
Anesthesia: General

## 2016-04-07 MED ORDER — SCOPOLAMINE 1 MG/3DAYS TD PT72
1.0000 | MEDICATED_PATCH | TRANSDERMAL | Status: DC
  Administered 2016-04-07: 1.5 mg via TRANSDERMAL
  Filled 2016-04-07: qty 1

## 2016-04-07 MED ORDER — PROMETHAZINE HCL 25 MG/ML IJ SOLN
6.2500 mg | INTRAMUSCULAR | Status: DC | PRN
  Filled 2016-04-07: qty 1

## 2016-04-07 MED ORDER — LIDOCAINE HCL 2 % IJ SOLN
INTRAMUSCULAR | Status: DC | PRN
  Administered 2016-04-07: 16 mL

## 2016-04-07 MED ORDER — ARTIFICIAL TEARS OP OINT
TOPICAL_OINTMENT | OPHTHALMIC | Status: AC
  Filled 2016-04-07: qty 3.5

## 2016-04-07 MED ORDER — FENTANYL CITRATE (PF) 100 MCG/2ML IJ SOLN
INTRAMUSCULAR | Status: DC | PRN
  Administered 2016-04-07: 100 ug via INTRAVENOUS

## 2016-04-07 MED ORDER — ONDANSETRON HCL 4 MG/2ML IJ SOLN
INTRAMUSCULAR | Status: AC
  Filled 2016-04-07: qty 2

## 2016-04-07 MED ORDER — LIDOCAINE HCL (CARDIAC) 20 MG/ML IV SOLN
INTRAVENOUS | Status: DC | PRN
  Administered 2016-04-07: 60 mg via INTRAVENOUS

## 2016-04-07 MED ORDER — PROPOFOL 10 MG/ML IV BOLUS
INTRAVENOUS | Status: DC | PRN
  Administered 2016-04-07: 200 mg via INTRAVENOUS

## 2016-04-07 MED ORDER — PROPOFOL 10 MG/ML IV BOLUS
INTRAVENOUS | Status: AC
  Filled 2016-04-07: qty 20

## 2016-04-07 MED ORDER — MIDAZOLAM HCL 5 MG/5ML IJ SOLN
INTRAMUSCULAR | Status: DC | PRN
  Administered 2016-04-07: 2 mg via INTRAVENOUS

## 2016-04-07 MED ORDER — DEXAMETHASONE SODIUM PHOSPHATE 4 MG/ML IJ SOLN
INTRAMUSCULAR | Status: DC | PRN
  Administered 2016-04-07: 10 mg via INTRAVENOUS

## 2016-04-07 MED ORDER — ONDANSETRON HCL 4 MG/2ML IJ SOLN
INTRAMUSCULAR | Status: DC | PRN
  Administered 2016-04-07: 4 mg via INTRAVENOUS

## 2016-04-07 MED ORDER — MIDAZOLAM HCL 2 MG/2ML IJ SOLN
INTRAMUSCULAR | Status: AC
  Filled 2016-04-07: qty 2

## 2016-04-07 MED ORDER — KETOROLAC TROMETHAMINE 30 MG/ML IJ SOLN
INTRAMUSCULAR | Status: DC | PRN
  Administered 2016-04-07: 450 mg via INTRAVENOUS
  Administered 2016-04-07: 30 mg via INTRAVENOUS

## 2016-04-07 MED ORDER — PROPOFOL 500 MG/50ML IV EMUL
INTRAVENOUS | Status: AC
  Filled 2016-04-07: qty 50

## 2016-04-07 MED ORDER — LIDOCAINE HCL (CARDIAC) 20 MG/ML IV SOLN
INTRAVENOUS | Status: AC
  Filled 2016-04-07: qty 5

## 2016-04-07 MED ORDER — LACTATED RINGERS IV SOLN
INTRAVENOUS | Status: DC
  Administered 2016-04-07 (×2): via INTRAVENOUS
  Filled 2016-04-07: qty 1000

## 2016-04-07 MED ORDER — DEXAMETHASONE SODIUM PHOSPHATE 10 MG/ML IJ SOLN
INTRAMUSCULAR | Status: AC
  Filled 2016-04-07: qty 1

## 2016-04-07 MED ORDER — LIDOCAINE 2% (20 MG/ML) 5 ML SYRINGE
INTRAMUSCULAR | Status: DC | PRN
  Administered 2016-04-07: 50 mg via INTRAVENOUS

## 2016-04-07 MED ORDER — FENTANYL CITRATE (PF) 100 MCG/2ML IJ SOLN
25.0000 ug | INTRAMUSCULAR | Status: DC | PRN
  Administered 2016-04-07 (×2): 50 ug via INTRAVENOUS
  Filled 2016-04-07: qty 1

## 2016-04-07 MED ORDER — SCOPOLAMINE 1 MG/3DAYS TD PT72
MEDICATED_PATCH | TRANSDERMAL | Status: AC
  Filled 2016-04-07: qty 1

## 2016-04-07 MED ORDER — PROPOFOL 500 MG/50ML IV EMUL
INTRAVENOUS | Status: DC | PRN
  Administered 2016-04-07: 200 ug/kg/min via INTRAVENOUS

## 2016-04-07 MED ORDER — FENTANYL CITRATE (PF) 100 MCG/2ML IJ SOLN
INTRAMUSCULAR | Status: AC
  Filled 2016-04-07: qty 2

## 2016-04-07 SURGICAL SUPPLY — 27 items
CANISTER SUCTION 2500CC (MISCELLANEOUS) ×3 IMPLANT
CATH ROBINSON RED A/P 16FR (CATHETERS) IMPLANT
CORD ACTIVE DISPOSABLE (ELECTRODE) ×2
CORD ELECTRO ACTIVE DISP (ELECTRODE) ×1 IMPLANT
COVER BACK TABLE 60X90IN (DRAPES) ×3 IMPLANT
DRAPE LG THREE QUARTER DISP (DRAPES) ×3 IMPLANT
DRSG TELFA 3X8 NADH (GAUZE/BANDAGES/DRESSINGS) ×3 IMPLANT
ELECT LOOP GYNE PRO 24FR (CUTTING LOOP) ×3
ELECT REM PT RETURN 9FT ADLT (ELECTROSURGICAL) ×3
ELECT VAPORTRODE GRVD BAR (ELECTRODE) IMPLANT
ELECTRODE LOOP GYNE PRO 24FR (CUTTING LOOP) ×1 IMPLANT
ELECTRODE REM PT RTRN 9FT ADLT (ELECTROSURGICAL) ×1 IMPLANT
GLOVE ORTHO TXT STRL SZ7.5 (GLOVE) ×3 IMPLANT
GOWN W/COTTON TOWEL STD LRG (GOWNS) ×3 IMPLANT
GOWN XL W/COTTON TOWEL STD (GOWNS) ×3 IMPLANT
KIT ROOM TURNOVER WOR (KITS) ×3 IMPLANT
LEGGING LITHOTOMY PAIR STRL (DRAPES) ×3 IMPLANT
MANIFOLD NEPTUNE II (INSTRUMENTS) IMPLANT
MYOSURE LITE POLYP REMOVAL (MISCELLANEOUS) ×3 IMPLANT
PACK BASIN DAY SURGERY FS (CUSTOM PROCEDURE TRAY) ×3 IMPLANT
PAD OB MATERNITY 4.3X12.25 (PERSONAL CARE ITEMS) ×3 IMPLANT
PAD PREP 24X48 CUFFED NSTRL (MISCELLANEOUS) ×3 IMPLANT
TOWEL OR 17X24 6PK STRL BLUE (TOWEL DISPOSABLE) ×6 IMPLANT
TRAY DSU PREP LF (CUSTOM PROCEDURE TRAY) ×3 IMPLANT
TUBE CONNECTING 12'X1/4 (SUCTIONS)
TUBE CONNECTING 12X1/4 (SUCTIONS) IMPLANT
WATER STERILE IRR 500ML POUR (IV SOLUTION) ×3 IMPLANT

## 2016-04-07 NOTE — Op Note (Signed)
Preoperative diagnosis: Postmenopausal bleeding, thickened endometrium Postoperative diagnosis: PMB, endometrial polyp Procedure: Hysteroscopy with dilation and curettage, resection of polyp with Myosure Surgeon: Cheri Fowler M.D. Anesthesia: Gen. With an LMA, paracervical block Findings: She had a normal endometrial cavity with a large polyp filling the cavity, minimal endometrial tissue Estimated blood loss: Minimal Fluid deficit: Through the hysteroscope fluid deficit was about 70 cc Specimens: Endometrial curettings and resection sent for routine pathology Complications: None  Procedure in detail: The patient was taken to the operating room and placed in the dorsosupine position. General anesthesia was induced. She was placed in mobile stirrups and legs were elevated. Perineum and vagina were prepped and draped in the usual sterile fashion. A Graves speculum was inserted in the vagina and the anterior lip of the cervix was grasped with a single-tooth tenaculum. Paracervical block was performed with a total of 16 cc of 2% plain lidocaine. The uterus then sounded to 8 cm. The cervix was gradually dilated to a size 19 dilator without difficulty. The observer hysteroscope was inserted and good visualization was achieved. A large polyp was identified. The hysteroscope was removed and the Myosure scope was inserted.  The polyp was then easily resected with Myosure and the scope was removed. Sharp curettage was performed which revealed minimal endometrial tissue. Hysteroscopy was again performed and revealed no significant endometrial tissue and no residual polyp. The hysteroscope was again removed. The single-tooth tenaculum was removed from the cervix and bleeding was controlled with pressure. All instruments were then removed from the vagina. The patient tolerated the procedure well and was taken to the recovery in stable condition. Counts were correct and she had PAS hose on throughout the  procedure.

## 2016-04-07 NOTE — Transfer of Care (Signed)
Immediate Anesthesia Transfer of Care Note  Patient: Tanya [name]  Procedure(s) Performed: Procedure(s): DILATATION AND CURETTAGE /HYSTEROSCOPY WITH MYOSURE POLYPECTOMY (N/A)  Patient Location: PACU  Anesthesia Type:General  Level of Consciousness: awake, alert , oriented and patient cooperative  Airway & Oxygen Therapy: Patient Spontanous Breathing and Patient connected to nasal cannula oxygen  Post-op Assessment: Report given to RN and Post -op Vital signs reviewed and stable  Post vital signs: Reviewed and stable  Last Vitals:  Filed Vitals:   04/07/16 0606 04/07/16 0815  BP: 130/70 110/71  Pulse: 71 58  Temp: 36.6 C 36.4 C  Resp: 16 21    Last Pain: There were no vitals filed for this visit.    Patients Stated Pain Goal: 8 ([id number])  Complications: No apparent anesthesia complications

## 2016-04-07 NOTE — Anesthesia Procedure Notes (Signed)
Procedure Name: LMA Insertion Date/Time: 04/07/2016 7:32 AM Performed by: Wanita Chamberlain Pre-anesthesia Checklist: Patient identified, Timeout performed, Emergency Drugs available, Suction available and Patient being monitored Patient Re-evaluated:Patient Re-evaluated prior to inductionOxygen Delivery Method: Circle system utilized Preoxygenation: Pre-oxygenation with 100% oxygen Intubation Type: IV induction LMA: LMA with gastric port inserted LMA Size: 4.0 Number of attempts: 1 Placement Confirmation: positive ETCO2 and breath sounds checked- equal and bilateral Tube secured with: Tape Dental Injury: Teeth and Oropharynx as per pre-operative assessment

## 2016-04-07 NOTE — Anesthesia Postprocedure Evaluation (Signed)
Anesthesia Post Note  Patient: Tanya [name]  Procedure(s) Performed: Procedure(s) (LRB): DILATATION AND CURETTAGE /HYSTEROSCOPY WITH MYOSURE POLYPECTOMY (N/A)  Patient location during evaluation: PACU Anesthesia Type: General Level of consciousness: awake and alert Pain management: pain level controlled Vital Signs Assessment: post-procedure vital signs reviewed and stable Respiratory status: spontaneous breathing, nonlabored ventilation, respiratory function stable and patient connected to nasal cannula oxygen Cardiovascular status: blood pressure returned to baseline and stable Postop Assessment: no signs of nausea or vomiting Anesthetic complications: no Comments: Denies Nausea/vomiting. Taking PO.    Last Vitals:  Filed Vitals:   04/07/16 0900 04/07/16 0915  BP: 107/65 117/74  Pulse: 82 66  Temp:  36.7 C  Resp: 23 18    Last Pain:  Filed Vitals:   04/07/16 0917  PainSc: 2                  [name]

## 2016-04-07 NOTE — Discharge Instructions (Signed)
Routine instructions for hysteroscopy Post Anesthesia Home Care Instructions  Activity: Get plenty of rest for the remainder of the day. A responsible adult should stay with you for 24 hours following the procedure.  For the next 24 hours, DO NOT: -Drive a car -Paediatric nurse -Drink alcoholic beverages -Take any medication unless instructed by your physician -Make any legal decisions or sign important papers.  Meals: Start with liquid foods such as gelatin or soup. Progress to regular foods as tolerated. Avoid greasy, spicy, heavy foods. If nausea and/or vomiting occur, drink only clear liquids until the nausea and/or vomiting subsides. Call your physician if vomiting continues.  Special Instructions/Symptoms: Your throat may feel dry or sore from the anesthesia or the breathing tube placed in your throat during surgery. If this causes discomfort, gargle with warm salt water. The discomfort should disappear within 24 hours.  If you had a scopolamine patch placed behind your ear for the management of post- operative nausea and/or vomiting:  1. The medication in the patch is effective for 72 hours, after which it should be removed.  Wrap patch in a tissue and discard in the trash. Wash hands thoroughly with soap and water. 2. You may remove the patch earlier than 72 hours if you experience unpleasant side effects which may include dry mouth, dizziness or visual disturbances. 3. Avoid touching the patch. Wash your hands with soap and water after contact with the patch.   Hysteroscopy Hysteroscopy is a procedure used for looking inside the womb (uterus). It may be done for various reasons, including:  To evaluate abnormal bleeding, fibroid (benign, noncancerous) tumors, polyps, scar tissue (adhesions), and possibly cancer of the uterus.  To look for lumps (tumors) and other uterine growths.  To look for causes of why a woman cannot get pregnant (infertility), causes of recurrent loss of  pregnancy (miscarriages), or a lost intrauterine device (IUD).  To perform a sterilization by blocking the fallopian tubes from inside the uterus. In this procedure, a thin, flexible tube with a tiny light and camera on the end of it (hysteroscope) is used to look inside the uterus. A hysteroscopy should be done right after a menstrual period to be sure you are not pregnant. LET [facility] CARE PROVIDER KNOW ABOUT:   Any allergies you have.  All medicines you are taking, including vitamins, herbs, eye drops, creams, and over-the-counter medicines.  Previous problems you or members of your family have had with the use of anesthetics.  Any blood disorders you have.  Previous surgeries you have had.  Medical conditions you have. RISKS AND COMPLICATIONS  Generally, this is a safe procedure. However, as with any procedure, complications can occur. Possible complications include:  Putting a hole in the uterus.  Excessive bleeding.  Infection.  Damage to the cervix.  Injury to other organs.  Allergic reaction to medicines.  Too much fluid used in the uterus for the procedure. BEFORE THE PROCEDURE   Ask your health care provider about changing or stopping any regular medicines.  Do not take aspirin or blood thinners for 1 week before the procedure, or as directed by your health care provider. These can cause bleeding.  If you smoke, do not smoke for 2 weeks before the procedure.  In some cases, a medicine is placed in the cervix the day before the procedure. This medicine makes the cervix have a larger opening (dilate). This makes it easier for the instrument to be inserted into the uterus during the procedure.  Do not eat or drink anything for at least 8 hours before the surgery.  Arrange for someone to take you home after the procedure. PROCEDURE   You may be given a medicine to relax you (sedative). You may also be given one of the following:  A medicine that numbs the  area around the cervix (local anesthetic).  A medicine that makes you sleep through the procedure (general anesthetic).  The hysteroscope is inserted through the vagina into the uterus. The camera on the hysteroscope sends a picture to a TV screen. This gives the surgeon a good view inside the uterus.  During the procedure, air or a liquid is put into the uterus, which allows the surgeon to see better.  Sometimes, tissue is gently scraped from inside the uterus. These tissue samples are sent to a lab for testing. AFTER THE PROCEDURE   If you had a general anesthetic, you may be groggy for a couple hours after the procedure.  If you had a local anesthetic, you will be able to go home as soon as you are stable and feel ready.  You may have some cramping. This normally lasts for a couple days.  You may have bleeding, which varies from light spotting for a few days to menstrual-like bleeding for 3-7 days. This is normal.  If your test results are not back during the visit, make an appointment with your health care provider to find out the results.   This information is not intended to replace advice given to you by your health care provider. Make sure you discuss any questions you have with your health care provider.   Document Released: 02/16/2001 Document Revised: 08/31/2013 Document Reviewed: 06/09/2013 Elsevier Interactive Patient Education [employer].

## 2016-04-07 NOTE — Interval H&P Note (Signed)
History and Physical Interval Note:  04/07/2016 7:11 AM  Tanya [name]  has presented today for surgery, with the diagnosis of PMB  The various methods of treatment have been discussed with the patient and family. After consideration of risks, benefits and other options for treatment, the patient has consented to  Procedure(s): DILATATION AND CURETTAGE /HYSTEROSCOPY (N/A) as a surgical intervention .  The patient's history has been reviewed, patient examined, no change in status, stable for surgery.  I have reviewed the patient's chart and labs.  Questions were answered to the patient's satisfaction.     Dyanne Yorks D

## 2016-04-08 ENCOUNTER — Encounter (HOSPITAL_BASED_OUTPATIENT_CLINIC_OR_DEPARTMENT_OTHER): Payer: Self-pay | Admitting: Obstetrics and Gynecology

## 2016-04-08 ENCOUNTER — Ambulatory Visit (INDEPENDENT_AMBULATORY_CARE_PROVIDER_SITE_OTHER): Payer: Managed Care, Other (non HMO) | Admitting: Pulmonary Disease

## 2016-04-08 VITALS — BP 148/88 | HR 86 | Ht 67.0 in | Wt 252.0 lb

## 2016-04-08 DIAGNOSIS — G4733 Obstructive sleep apnea (adult) (pediatric): Secondary | ICD-10-CM | POA: Diagnosis not present

## 2016-04-08 DIAGNOSIS — J4520 Mild intermittent asthma, uncomplicated: Secondary | ICD-10-CM

## 2016-04-08 MED ORDER — ALBUTEROL SULFATE HFA 108 (90 BASE) MCG/ACT IN AERS: 2.0000 | INHALATION_SPRAY | Freq: Four times a day (QID) | RESPIRATORY_TRACT | Status: DC | PRN

## 2016-04-08 NOTE — Progress Notes (Signed)
   Subjective:    Patient ID: Tanya [name], female    DOB: 18-Mar-1956, 60 y.o.   MRN: [id number]  HPI  60/F ex smoker for fu of obstructive sleep apnea & mild int asthma .  She moved from [place] to [place].  PSG in [place] in 1997/98 @ [place] (not available) >> severe obstructive sleep apnea & set up on nasal CPAP - supplier is [facility].  Asthma was diagnosed as a child , she grew out of this as an adult & is now back to using pro-air , 2 cannisters/ year.   06/2012-CPAP pressure is 11 cm.  She was supplied with new CPAP   She uses a mouth guard for bruxism, c/o dry mouth in am   04/08/2016  Chief Complaint  Patient presents with  . Follow-up    pt states she wears CPAP avg 7-9 hr nightly. mask & pressure are ok. no supplies needed. DME: [facility]    Annual FU  She is very compliant with her CPAP machine, set at 11 cm. She has good improvement in her daytime somnolence and fatigue. She took her machine with her when on a cruise. No problems with nasal mask, no leaks, no dryness or nasal symptoms, pressure is okay Her weight is unchanged No download is available today since she did not get her CHP from her machine  Asthma is well controlled-infection does not have to use albuterol  rescue inhaler long time She denies nocturnal wheezing     Review of Systems Patient denies significant dyspnea,cough, hemoptysis,  chest pain, palpitations, pedal edema, orthopnea, paroxysmal nocturnal dyspnea, lightheadedness, nausea, vomiting, abdominal or  leg pains      Objective:   Physical Exam  Gen. Pleasant, obese, in no distress ENT - no lesions, no post nasal drip Neck: No JVD, no thyromegaly, no carotid bruits Lungs: no use of accessory muscles, no dullness to percussion, decreased without rales or rhonchi  Cardiovascular: Rhythm regular, heart sounds  normal, no murmurs or gallops, no peripheral edema Musculoskeletal: No deformities, no cyanosis or  clubbing , no tremors       Assessment & Plan:

## 2016-04-08 NOTE — Assessment & Plan Note (Signed)
Weight loss encouraged, compliance with goal of at least 4-6 hrs every night is the expectation. Advised against medications with sedative side effects Cautioned against driving when sleepy - understanding that sleepiness will vary on a day to day basis  

## 2016-04-08 NOTE — Patient Instructions (Addendum)
CPAP supplies will be renewed for a year Refill on albuterol MDI

## 2016-04-08 NOTE — Addendum Note (Signed)
Addended by: Maryanna Shape A on: 04/08/2016 04:46 PM   Modules accepted: Orders

## 2016-04-08 NOTE — Assessment & Plan Note (Signed)
Refill on albuterol MDI for rescue

## 2016-04-08 NOTE — Addendum Note (Signed)
Addended by: Maryanna Shape A on: 04/08/2016 04:44 PM   Modules accepted: Orders

## 2016-04-30 ENCOUNTER — Other Ambulatory Visit: Payer: Self-pay | Admitting: Internal Medicine

## 2016-05-15 NOTE — Patient Instructions (Addendum)
Your procedure is scheduled on:  Monday, June 02, 2016  Enter through the Main Entrance of [facility] at: 8:00 AM  Pick up the phone at the desk and dial [phone].  Call this number if you have problems the morning of surgery: [phone].  Remember: Do NOT eat food or drink after:  Midnight Sunday, June 01, 2016  Take these medicines the morning of surgery with a SIP OF WATER:  Amlodipine, Valsartan  Bring Asthma inhaler day of surgery.  Do NOT wear jewelry (body piercing), metal hair clips/bobby pins, make-up, or nail polish. Do NOT wear lotions, powders, or perfumes.  You may wear deodorant. Do NOT shave for 48 hours prior to surgery. Do NOT bring valuables to the hospital. Contacts, dentures, or bridgework may not be worn into surgery.  Leave suitcase in car.  After surgery it may be brought to your room.  For patients admitted to the hospital, checkout time is 11:00 AM the day of discharge.

## 2016-05-21 ENCOUNTER — Encounter (HOSPITAL_COMMUNITY)
Admission: RE | Admit: 2016-05-21 | Discharge: 2016-05-21 | Disposition: A | Discharge status: Home / Self Care | Payer: Managed Care, Other (non HMO) | Source: Ambulatory Visit | Attending: Obstetrics and Gynecology | Admitting: Obstetrics and Gynecology

## 2016-05-21 ENCOUNTER — Encounter (HOSPITAL_COMMUNITY): Payer: Self-pay

## 2016-05-21 DIAGNOSIS — Z01812 Encounter for preprocedural laboratory examination: Secondary | ICD-10-CM | POA: Diagnosis not present

## 2016-05-21 HISTORY — DX: Asymptomatic varicose veins of unspecified lower extremity: I83.90

## 2016-05-21 HISTORY — DX: Malignant (primary) neoplasm, unspecified: C80.1

## 2016-05-21 HISTORY — DX: Sleep apnea, unspecified: G47.30

## 2016-05-21 HISTORY — DX: Dysplasia of cervix uteri, unspecified: N87.9

## 2016-05-21 HISTORY — DX: Pain in unspecified hip: M25.559

## 2016-05-21 LAB — COMPREHENSIVE METABOLIC PANEL
ALBUMIN: 4.4 g/dL (ref 3.5–5.0)
ALK PHOS: 76 U/L (ref 38–126)
ALT: 26 U/L (ref 14–54)
AST: 22 U/L (ref 15–41)
Anion gap: 8 (ref 5–15)
BUN: 14 mg/dL (ref 6–20)
CALCIUM: 9.5 mg/dL (ref 8.9–10.3)
CO2: 25 mmol/L (ref 22–32)
CREATININE: 0.62 mg/dL (ref 0.44–1.00)
Chloride: 105 mmol/L (ref 101–111)
GFR calc Af Amer: >60 mL/min (ref >60)
GFR calc non Af Amer: >60 mL/min (ref >60)
GLUCOSE: 124 mg/dL — AB (ref 65–99)
Potassium: 4.3 mmol/L (ref 3.5–5.1)
SODIUM: 138 mmol/L (ref 135–145)
Total Bilirubin: 0.6 mg/dL (ref 0.3–1.2)
Total Protein: 7.7 g/dL (ref 6.5–8.1)

## 2016-05-21 LAB — CBC
HCT: 43.1 % (ref 36.0–46.0)
HEMOGLOBIN: 14.9 g/dL (ref 12.0–15.0)
MCH: 30.2 pg (ref 26.0–34.0)
MCHC: 34.6 g/dL (ref 30.0–36.0)
MCV: 87.2 fL (ref 78.0–100.0)
Platelets: 206 10*3/uL (ref 150–400)
RBC: 4.94 MIL/uL (ref 3.87–5.11)
RDW: 13.3 % (ref 11.5–15.5)
WBC: 6 10*3/uL (ref 4.0–10.5)

## 2016-06-01 NOTE — H&P (Signed)
Tanya [name] is an 60 y.o. female. She was seen in March of this year for annual gyn exam, had no gyn problems other than OAB, has been menopausal for several years.  However, she had some bleeding in April, unable to do pipelle in the office, had thickened endometrium by ultrasound.  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    Pertinent Gynecological History: Last mammogram: normal Date: 01/2016 Last pap: normal Date: 2015 OB History: G1, P0010   Menstrual History: Patient's last menstrual period was 07/14/2013.    Past Medical History  Diagnosis Date  . Blepharospasm 10/09/2010  . Hypertension   . Mild intermittent asthma   . Allergic rhinitis   . OA (osteoarthritis)   . PMB (postmenopausal bleeding)   . History of melanoma excision     MULTIPLE SITES  . PONV (postoperative nausea and vomiting)     did not experience after D&C, request to use same medicaitons from that procedure  . Varicose vein     wears compression stocking  . Sleep apnea   . Cancer (HCC)     Skin  . Precancerous changes of the cervix   . Hip pain     Past Surgical History  Procedure Laterality Date  . Tailor bunion removal    . Hammer toe surgery    . Esophagogastroduodenoscopy  10/14/2004  . Total knee arthroplasty Left 12/29/2013    Procedure: LEFT TOTAL KNEE ARTHROPLASTY;  Surgeon: Johnn Hai, MD;  Location: [facility];  Service: Orthopedics;  Laterality: Left;  . Knee arthroscopy Bilateral left 08-01-2005/  right  . Dilation and curettage of uterus    . Re-do rotator cuff repair/  sad/  acromioplasty Right 01-04-2009  . Shoulder arthroscopy w/ rotator cuff repair Right   . Hysteroscopy w/d&c N/A 04/07/2016    Procedure: DILATATION AND CURETTAGE /HYSTEROSCOPY WITH MYOSURE POLYPECTOMY;  Surgeon: Cheri Fowler, MD;  Location: [facility];  Service: Gynecology;  Laterality: N/A;  . Colonoscopy      Family History  Problem Relation Age of Onset  .  Esophageal cancer Father   . Melanoma Sister   . Diabetes Other   . Ovarian cancer Other   . Asthma Other     siblings    Social History:  reports that she quit smoking about 28 years ago. Her smoking use included Cigarettes. She has a 20 pack-year smoking history. She has never used smokeless tobacco. She reports that she drinks about 1.2 oz of alcohol per week. She reports that she does not use illicit drugs.  Allergies:  Allergies  Allergen Reactions  . Ace Inhibitors Hives    hives  . Penicillins Hives    Has patient had a PCN reaction causing immediate rash, facial/tongue/throat swelling, SOB or lightheadedness with hypotension: Yes Has patient had a PCN reaction causing severe rash involving mucus membranes or skin necrosis: No Has patient had a PCN reaction that required hospitalization No Has patient had a PCN reaction occurring within the last 10 years: No If all of the above answers are "NO", then may proceed with Cephalosporin use.   . Pseudoephedrine-Codeine Hives    No prescriptions prior to admission    Review of Systems  Respiratory: Negative.   Cardiovascular: Negative.   Gastrointestinal: Negative.   Genitourinary: Negative.     Last menstrual period 07/14/2013. Physical Exam  Constitutional: She appears well-developed and well-nourished.  Neck: Neck supple. No thyromegaly present.  Cardiovascular: Normal rate, regular rhythm and normal heart  sounds.   No murmur heard. Respiratory: Effort normal and breath sounds normal. No respiratory distress.  GI: Soft. She exhibits no distension and no mass. There is no tenderness.  Genitourinary: Vagina normal and uterus normal.  No adnexal mass    No results found for this or any previous visit (from the past 24 hour(s)).  No results found.  Assessment/Plan: Complex atypical endometrial hyperplasia.  All medical and surgical options have been discussed, and I discussed her case briefly with Dr.  Fermin Schwab.  Discussed surgical procedure, risks, chances of taking care of the issue.  Will admit for TLH/BSO with washings.    Jonae Renshaw D 06/01/2016, 7:52 PM

## 2016-06-02 ENCOUNTER — Ambulatory Visit (HOSPITAL_COMMUNITY): Payer: Managed Care, Other (non HMO) | Admitting: Anesthesiology

## 2016-06-02 ENCOUNTER — Encounter (HOSPITAL_COMMUNITY): Payer: Self-pay

## 2016-06-02 ENCOUNTER — Ambulatory Visit (HOSPITAL_COMMUNITY)
Admission: RE | Admit: 2016-06-02 | Discharge: 2016-06-02 | Disposition: A | Discharge status: Home / Self Care | Payer: Managed Care, Other (non HMO) | Source: Ambulatory Visit | Attending: Obstetrics and Gynecology | Admitting: Obstetrics and Gynecology

## 2016-06-02 ENCOUNTER — Encounter (HOSPITAL_COMMUNITY)
Admission: RE | Disposition: A | Discharge status: Home / Self Care | Payer: Self-pay | Source: Ambulatory Visit | Attending: Obstetrics and Gynecology

## 2016-06-02 DIAGNOSIS — N8502 Endometrial intraepithelial neoplasia [EIN]: Secondary | ICD-10-CM | POA: Diagnosis not present

## 2016-06-02 DIAGNOSIS — D252 Subserosal leiomyoma of uterus: Secondary | ICD-10-CM | POA: Insufficient documentation

## 2016-06-02 DIAGNOSIS — D251 Intramural leiomyoma of uterus: Secondary | ICD-10-CM | POA: Insufficient documentation

## 2016-06-02 DIAGNOSIS — Z6838 Body mass index (BMI) 38.0-38.9, adult: Secondary | ICD-10-CM | POA: Insufficient documentation

## 2016-06-02 DIAGNOSIS — Z85820 Personal history of malignant melanoma of skin: Secondary | ICD-10-CM | POA: Insufficient documentation

## 2016-06-02 DIAGNOSIS — Z880 Allergy status to penicillin: Secondary | ICD-10-CM | POA: Insufficient documentation

## 2016-06-02 DIAGNOSIS — I10 Essential (primary) hypertension: Secondary | ICD-10-CM | POA: Insufficient documentation

## 2016-06-02 DIAGNOSIS — G4730 Sleep apnea, unspecified: Secondary | ICD-10-CM | POA: Insufficient documentation

## 2016-06-02 DIAGNOSIS — Z90710 Acquired absence of both cervix and uterus: Secondary | ICD-10-CM | POA: Diagnosis present

## 2016-06-02 DIAGNOSIS — Z87891 Personal history of nicotine dependence: Secondary | ICD-10-CM | POA: Diagnosis not present

## 2016-06-02 HISTORY — PX: LAPAROSCOPIC SALPINGO OOPHERECTOMY: SHX5927

## 2016-06-02 HISTORY — PX: LAPAROSCOPIC HYSTERECTOMY: SHX1926

## 2016-06-02 SURGERY — HYSTERECTOMY, TOTAL, LAPAROSCOPIC
Anesthesia: General

## 2016-06-02 MED ORDER — SUGAMMADEX SODIUM 200 MG/2ML IV SOLN
INTRAVENOUS | Status: DC | PRN
  Administered 2016-06-02: 200 mg via INTRAVENOUS

## 2016-06-02 MED ORDER — IBUPROFEN 600 MG PO TABS: 600.0000 mg | ORAL_TABLET | Freq: Four times a day (QID) | ORAL | Status: DC | PRN

## 2016-06-02 MED ORDER — CELECOXIB 200 MG PO CAPS: 200.0000 mg | ORAL_CAPSULE | Freq: Two times a day (BID) | ORAL | Status: DC

## 2016-06-02 MED ORDER — SODIUM CHLORIDE 0.9 % IJ SOLN
INTRAMUSCULAR | Status: DC | PRN
  Administered 2016-06-02: 10 mL

## 2016-06-02 MED ORDER — ALUM & MAG HYDROXIDE-SIMETH 200-200-20 MG/5ML PO SUSP: 30.0000 mL | ORAL | Status: DC | PRN

## 2016-06-02 MED ORDER — MONTELUKAST SODIUM 10 MG PO TABS
10.0000 mg | ORAL_TABLET | Freq: Every morning | ORAL | Status: DC
  Filled 2016-06-02 (×2): qty 1

## 2016-06-02 MED ORDER — SIMETHICONE 80 MG PO CHEW: 80.0000 mg | CHEWABLE_TABLET | Freq: Four times a day (QID) | ORAL | Status: DC | PRN

## 2016-06-02 MED ORDER — FENTANYL CITRATE (PF) 100 MCG/2ML IJ SOLN
INTRAMUSCULAR | Status: AC
  Filled 2016-06-02: qty 2

## 2016-06-02 MED ORDER — KETOROLAC TROMETHAMINE 30 MG/ML IJ SOLN
30.0000 mg | Freq: Once | INTRAMUSCULAR | Status: AC
  Administered 2016-06-02: 30 mg via INTRAVENOUS

## 2016-06-02 MED ORDER — BUPIVACAINE HCL (PF) 0.25 % IJ SOLN
INTRAMUSCULAR | Status: AC
  Filled 2016-06-02: qty 30

## 2016-06-02 MED ORDER — KETOROLAC TROMETHAMINE 30 MG/ML IJ SOLN
INTRAMUSCULAR | Status: AC
  Filled 2016-06-02: qty 1

## 2016-06-02 MED ORDER — ONDANSETRON HCL 4 MG/2ML IJ SOLN: 4.0000 mg | Freq: Once | INTRAMUSCULAR | Status: DC | PRN

## 2016-06-02 MED ORDER — SUGAMMADEX SODIUM 200 MG/2ML IV SOLN
INTRAVENOUS | Status: AC
  Filled 2016-06-02: qty 2

## 2016-06-02 MED ORDER — HYDROMORPHONE HCL 1 MG/ML IJ SOLN
INTRAMUSCULAR | Status: DC | PRN
  Administered 2016-06-02: 1 mg via INTRAVENOUS

## 2016-06-02 MED ORDER — FENTANYL CITRATE (PF) 100 MCG/2ML IJ SOLN
INTRAMUSCULAR | Status: DC | PRN
  Administered 2016-06-02: 100 ug via INTRAVENOUS
  Administered 2016-06-02: 50 ug via INTRAVENOUS
  Administered 2016-06-02: 100 ug via INTRAVENOUS

## 2016-06-02 MED ORDER — ONDANSETRON HCL 4 MG/2ML IJ SOLN
INTRAMUSCULAR | Status: DC | PRN
  Administered 2016-06-02: 4 mg via INTRAVENOUS

## 2016-06-02 MED ORDER — MENTHOL 3 MG MT LOZG: 1.0000 | LOZENGE | OROMUCOSAL | Status: DC | PRN

## 2016-06-02 MED ORDER — LIDOCAINE HCL (CARDIAC) 20 MG/ML IV SOLN
INTRAVENOUS | Status: DC | PRN
  Administered 2016-06-02: 100 mg via INTRAVENOUS

## 2016-06-02 MED ORDER — KETOROLAC TROMETHAMINE 30 MG/ML IJ SOLN
30.0000 mg | Freq: Once | INTRAMUSCULAR | Status: DC
Start: 2016-06-02 — End: 2016-06-02

## 2016-06-02 MED ORDER — ONDANSETRON HCL 4 MG/2ML IJ SOLN: 4.0000 mg | Freq: Four times a day (QID) | INTRAMUSCULAR | Status: DC | PRN

## 2016-06-02 MED ORDER — HEPARIN SODIUM (PORCINE) 5000 UNIT/ML IJ SOLN
INTRAMUSCULAR | Status: DC | PRN
  Administered 2016-06-02: 5000 [IU]

## 2016-06-02 MED ORDER — SCOPOLAMINE 1 MG/3DAYS TD PT72
MEDICATED_PATCH | TRANSDERMAL | Status: AC
  Administered 2016-06-02: 1.5 mg via TRANSDERMAL
  Filled 2016-06-02: qty 1

## 2016-06-02 MED ORDER — PROPOFOL 10 MG/ML IV BOLUS
INTRAVENOUS | Status: DC | PRN
  Administered 2016-06-02: 200 mg via INTRAVENOUS

## 2016-06-02 MED ORDER — ONDANSETRON HCL 4 MG PO TABS: 4.0000 mg | ORAL_TABLET | Freq: Four times a day (QID) | ORAL | Status: DC | PRN

## 2016-06-02 MED ORDER — FENTANYL CITRATE (PF) 250 MCG/5ML IJ SOLN
INTRAMUSCULAR | Status: AC
  Filled 2016-06-02: qty 5

## 2016-06-02 MED ORDER — SCOPOLAMINE 1 MG/3DAYS TD PT72
1.0000 | MEDICATED_PATCH | Freq: Once | TRANSDERMAL | Status: DC
  Administered 2016-06-02: 1.5 mg via TRANSDERMAL

## 2016-06-02 MED ORDER — LORATADINE 10 MG PO TABS
10.0000 mg | ORAL_TABLET | Freq: Every day | ORAL | Status: DC
  Filled 2016-06-02 (×2): qty 1

## 2016-06-02 MED ORDER — OXYCODONE-ACETAMINOPHEN 5-325 MG PO TABS: 1.0000 | ORAL_TABLET | ORAL | Status: DC | PRN

## 2016-06-02 MED ORDER — IRBESARTAN 75 MG PO TABS
75.0000 mg | ORAL_TABLET | Freq: Every day | ORAL | Status: DC
  Filled 2016-06-02: qty 1

## 2016-06-02 MED ORDER — FENTANYL CITRATE (PF) 100 MCG/2ML IJ SOLN: 25.0000 ug | INTRAMUSCULAR | Status: DC | PRN

## 2016-06-02 MED ORDER — LACTATED RINGERS IR SOLN
Status: DC | PRN
Start: 2016-06-02 — End: 2016-06-02
  Administered 2016-06-02: 3000 mL

## 2016-06-02 MED ORDER — MIDAZOLAM HCL 2 MG/2ML IJ SOLN
INTRAMUSCULAR | Status: DC | PRN
  Administered 2016-06-02: 2 mg via INTRAVENOUS

## 2016-06-02 MED ORDER — AMLODIPINE BESYLATE 10 MG PO TABS
10.0000 mg | ORAL_TABLET | Freq: Every day | ORAL | Status: DC
  Filled 2016-06-02: qty 1

## 2016-06-02 MED ORDER — KETOROLAC TROMETHAMINE 30 MG/ML IJ SOLN: 30.0000 mg | Freq: Four times a day (QID) | INTRAMUSCULAR | Status: DC

## 2016-06-02 MED ORDER — DEXTROSE-NACL 5-0.45 % IV SOLN: INTRAVENOUS | Status: DC

## 2016-06-02 MED ORDER — ROCURONIUM BROMIDE 100 MG/10ML IV SOLN
INTRAVENOUS | Status: AC
  Filled 2016-06-02: qty 1

## 2016-06-02 MED ORDER — HYDROMORPHONE HCL 1 MG/ML IJ SOLN: 1.0000 mg | INTRAMUSCULAR | Status: DC | PRN

## 2016-06-02 MED ORDER — ROCURONIUM BROMIDE 100 MG/10ML IV SOLN
INTRAVENOUS | Status: DC | PRN
  Administered 2016-06-02: 50 mg via INTRAVENOUS
  Administered 2016-06-02 (×2): 20 mg via INTRAVENOUS

## 2016-06-02 MED ORDER — HEPARIN SODIUM (PORCINE) 5000 UNIT/ML IJ SOLN
INTRAMUSCULAR | Status: AC
  Filled 2016-06-02: qty 1

## 2016-06-02 MED ORDER — ALBUTEROL SULFATE HFA 108 (90 BASE) MCG/ACT IN AERS
INHALATION_SPRAY | RESPIRATORY_TRACT | Status: DC | PRN
  Administered 2016-06-02: 2 via RESPIRATORY_TRACT

## 2016-06-02 MED ORDER — BUPIVACAINE HCL (PF) 0.25 % IJ SOLN
INTRAMUSCULAR | Status: DC | PRN
  Administered 2016-06-02: 7 mL
  Administered 2016-06-02: 4 mL

## 2016-06-02 MED ORDER — ALBUTEROL SULFATE (2.5 MG/3ML) 0.083% IN NEBU: 3.0000 mL | INHALATION_SOLUTION | Freq: Four times a day (QID) | RESPIRATORY_TRACT | Status: DC | PRN

## 2016-06-02 MED ORDER — CEFAZOLIN SODIUM-DEXTROSE 2-3 GM-% IV SOLR
INTRAVENOUS | Status: DC | PRN
  Administered 2016-06-02: 2 g via INTRAVENOUS

## 2016-06-02 MED ORDER — MIDAZOLAM HCL 2 MG/2ML IJ SOLN
INTRAMUSCULAR | Status: AC
  Filled 2016-06-02: qty 2

## 2016-06-02 MED ORDER — DEXAMETHASONE SODIUM PHOSPHATE 4 MG/ML IJ SOLN
INTRAMUSCULAR | Status: AC
  Filled 2016-06-02: qty 1

## 2016-06-02 MED ORDER — MEPERIDINE HCL 25 MG/ML IJ SOLN: 6.2500 mg | INTRAMUSCULAR | Status: DC | PRN

## 2016-06-02 MED ORDER — LACTATED RINGERS IV SOLN
INTRAVENOUS | Status: DC
  Administered 2016-06-02 (×3): via INTRAVENOUS

## 2016-06-02 MED ORDER — OXYCODONE-ACETAMINOPHEN 5-325 MG PO TABS
1.0000 | ORAL_TABLET | ORAL | Status: DC | PRN
  Administered 2016-06-02: 2 via ORAL
  Filled 2016-06-02: qty 2

## 2016-06-02 MED ORDER — SODIUM CHLORIDE 0.9 % IJ SOLN
INTRAMUSCULAR | Status: AC
  Filled 2016-06-02: qty 10

## 2016-06-02 MED ORDER — HYDROMORPHONE HCL 1 MG/ML IJ SOLN
INTRAMUSCULAR | Status: AC
  Filled 2016-06-02: qty 1

## 2016-06-02 MED ORDER — LIDOCAINE HCL (CARDIAC) 20 MG/ML IV SOLN
INTRAVENOUS | Status: AC
  Filled 2016-06-02: qty 5

## 2016-06-02 MED ORDER — ONDANSETRON HCL 4 MG/2ML IJ SOLN
INTRAMUSCULAR | Status: AC
  Filled 2016-06-02: qty 2

## 2016-06-02 MED ORDER — PROPOFOL 10 MG/ML IV BOLUS
INTRAVENOUS | Status: AC
  Filled 2016-06-02: qty 20

## 2016-06-02 MED ORDER — DEXAMETHASONE SODIUM PHOSPHATE 10 MG/ML IJ SOLN
INTRAMUSCULAR | Status: DC | PRN
  Administered 2016-06-02: 4 mg via INTRAVENOUS

## 2016-06-02 SURGICAL SUPPLY — 54 items
BARRIER ADHS 3X4 INTERCEED (GAUZE/BANDAGES/DRESSINGS) IMPLANT
CABLE HIGH FREQUENCY MONO STRZ (ELECTRODE) IMPLANT
CATH ROBINSON RED A/P 16FR (CATHETERS) IMPLANT
CLOTH BEACON ORANGE TIMEOUT ST (SAFETY) ×3 IMPLANT
COVER BACK TABLE 60X90IN (DRAPES) ×3 IMPLANT
COVER LIGHT HANDLE  1/PK (MISCELLANEOUS) ×2
COVER LIGHT HANDLE 1/PK (MISCELLANEOUS) ×4 IMPLANT
COVER MAYO STAND STRL (DRAPES) ×3 IMPLANT
DECANTER SPIKE VIAL GLASS SM (MISCELLANEOUS) ×3 IMPLANT
DEVICE SUTURE ENDOST 10MM (ENDOMECHANICALS) ×3 IMPLANT
DRSG COVADERM PLUS 2X2 (GAUZE/BANDAGES/DRESSINGS) IMPLANT
DRSG OPSITE POSTOP 3X4 (GAUZE/BANDAGES/DRESSINGS) ×3 IMPLANT
DURAPREP 26ML APPLICATOR (WOUND CARE) ×3 IMPLANT
EVACUATOR SMOKE 8.L (FILTER) ×3 IMPLANT
GLOVE BIO SURGEON STRL SZ8 (GLOVE) ×3 IMPLANT
GLOVE BIOGEL PI IND STRL 7.0 (GLOVE) ×6 IMPLANT
GLOVE BIOGEL PI IND STRL 8 (GLOVE) ×2 IMPLANT
GLOVE BIOGEL PI INDICATOR 7.0 (GLOVE) ×3
GLOVE BIOGEL PI INDICATOR 8 (GLOVE) ×1
GLOVE ORTHO TXT STRL SZ7.5 (GLOVE) ×3 IMPLANT
GOWN STRL REUS W/TWL LRG LVL3 (GOWN DISPOSABLE) ×6 IMPLANT
GOWN STRL REUS W/TWL XL LVL3 (GOWN DISPOSABLE) ×3 IMPLANT
LIQUID BAND (GAUZE/BANDAGES/DRESSINGS) ×3 IMPLANT
NEEDLE EPID 17G 5 ECHO TUOHY (NEEDLE) IMPLANT
NEEDLE INSUFFLATION 120MM (ENDOMECHANICALS) ×3 IMPLANT
NS IRRIG 1000ML POUR BTL (IV SOLUTION) ×3 IMPLANT
OCCLUDER COLPOPNEUMO (BALLOONS) ×3 IMPLANT
PACK LAPAROSCOPY BASIN (CUSTOM PROCEDURE TRAY) ×3 IMPLANT
PACK LAVH (CUSTOM PROCEDURE TRAY) IMPLANT
PAD TRENDELENBURG POSITION (MISCELLANEOUS) ×3 IMPLANT
POUCH SPECIMEN RETRIEVAL 10MM (ENDOMECHANICALS) IMPLANT
SCISSORS LAP 5X35 DISP (ENDOMECHANICALS) IMPLANT
SET CYSTO W/LG BORE CLAMP LF (SET/KITS/TRAYS/PACK) IMPLANT
SET IRRIG TUBING LAPAROSCOPIC (IRRIGATION / IRRIGATOR) ×3 IMPLANT
SHEARS HARMONIC ACE PLUS 36CM (ENDOMECHANICALS) ×3 IMPLANT
SLEEVE XCEL OPT CAN 5 100 (ENDOMECHANICALS) ×3 IMPLANT
SOLUTION ELECTROLUBE (MISCELLANEOUS) ×3 IMPLANT
SUT ENDO VLOC 180-0-8IN (SUTURE) ×3 IMPLANT
SUT VIC AB 0 CT1 36 (SUTURE) IMPLANT
SUT VICRYL 0 UR6 27IN ABS (SUTURE) ×3 IMPLANT
SUT VICRYL 4-0 PS2 18IN ABS (SUTURE) ×3 IMPLANT
SYR TOOMEY 50ML (SYRINGE) ×3 IMPLANT
SYRINGE 10CC LL (SYRINGE) ×3 IMPLANT
TIP UTERINE 5.1X6CM LAV DISP (MISCELLANEOUS) IMPLANT
TIP UTERINE 6.7X10CM GRN DISP (MISCELLANEOUS) IMPLANT
TIP UTERINE 6.7X6CM WHT DISP (MISCELLANEOUS) IMPLANT
TIP UTERINE 6.7X8CM BLUE DISP (MISCELLANEOUS) ×3 IMPLANT
TOWEL OR 17X24 6PK STRL BLUE (TOWEL DISPOSABLE) ×6 IMPLANT
TRAY FOLEY CATH SILVER 14FR (SET/KITS/TRAYS/PACK) ×3 IMPLANT
TROCAR XCEL DIL TIP R 11M (ENDOMECHANICALS) ×3 IMPLANT
TROCAR XCEL NON-BLD 11X100MML (ENDOMECHANICALS) IMPLANT
TROCAR XCEL NON-BLD 5MMX100MML (ENDOMECHANICALS) ×3 IMPLANT
WARMER LAPAROSCOPE (MISCELLANEOUS) ×3 IMPLANT
WATER STERILE IRR 1000ML POUR (IV SOLUTION) ×3 IMPLANT

## 2016-06-02 NOTE — Progress Notes (Signed)
Post-op check Doing ok, mild pain/cramping, wants to go home Afeb, VSS, adequate clear urine Abd- soft, incisions intact Stable for d/c home

## 2016-06-02 NOTE — Discharge Instructions (Signed)
Routine instructions for hysterectomy °

## 2016-06-02 NOTE — Op Note (Signed)
Preoperative diagnosis:  Complex atypical endometrial hyperplasia Postoperative diagnosis: Same  Procedure: Total laparoscopic hysterectomy, bilateral salpingooophorectomy Surgeon: Cheri Fowler M.D.  Assistant: Crawford Givens, D.O. Anesthesia: Gen. Endotracheal tube  Findings: She had an essentially normal pelvis with normal uterus, tubes and ovaries  Specimens: Uterus, tubes, ovaries and pelvic washings for routine pathology  Estimated blood loss: [id number] cc  Complications: None  Procedure in detail:   The patient was taken to the operating room and placed in the dorsosupine position. General anesthesia was induced. Arms were tucked to her sides and legs were placed in mobile stirrups. Abdomen perineum and vagina were then prepped and draped in usual sterile fashion. An 12mm RUMI with a medium sized metal cup was then applied to the uterus and cervix for uterine manipulation and a Foley catheter was inserted. Infraumbilical skin was infiltrated with quarter percent Marcaine and a 1 cm vertical incision was made. A veress needle was inserted into the peritoneal cavity and placement confirmed by the water drop test an opening pressure of 6 mm of mercury. CO2 was insufflated to a pressure of 13 mm mercury and a veress needle was removed. A 5 mm trocar was then introduced with direct visualization with the laparoscope. A 5 mm port was then also placed on the right side and a 10/11 trocar placed on the left side under direct visualization. Inspection revealed the above-mentioned findings. The distal right fallopian tube was grasped with a grasper from the left side. The Harmonic scalpel Ace was used to take down the right infundibulopelvic ligament and the mesosalpinx, followed by the round ligament and broad ligament. The anterior peritoneum was incised across the anterior portion of the uterus to help release the bladder. Uterine artery artery was skeletonized and taken down with the harmonic scalpel Ace with  adequate division and adequate hemostasis. A similar procedure was then performed on the patient's left side taking down the infundibulopelvic ligament, mesosalpinx, round ligament, utero-ovarian pedicle, and broad ligament. Anterior peritoneum was incised across the anterior portion the uterus to meet the incision coming from the patient's right side. Uterine artery was skeletonized and taken down with the Harmonic Scalpel with adequate division and adequate hemostasis. Bladder was released inferiorly and I then began to detach the cervix from the vagina. The RUMI cup was pushed superiorly, the Harmonic scalpel was placed on the cup edge and a circumferential incision was made starting anteriorly into the vagina. This released the uterosacral ligaments as well as all attachments to the vagina. This was done with some difficulty as the vaginal occluder did not work, so small lap tapes were put in the vagina to help maintain pneumoperitoneum.  At this point all pedicles appeared to be hemostatic and there was no other pathology noted.  The uterus, tubes and ovaries were then pulled into the vagina for occlusion.  We were easily able to see vaginal edges through the laparoscope.  The vaginal cuff was then closed with running 0 V-loc suture with the Endostitch device with adequate closure and hemostasis. The pelvis was irrigated and all pedicles found to be hemostatic. The lateral ports were removed under direct visualization and all gas was allowed to deflate from the abdomen. The umbilical trocar was then removed. A suture of 0 Vicryl was placed into the fascia on the left side where the 10/11 trocar had been placed.  Skin incisions were closed with interrupted subcuticular sutures of 4-0 Vicryl followed by Dermabond. The patient tolerated the procedure well. She was taken to  the recovery in stable condition. Counts were correct x2, she received Ancef 2 g IV the beginning of the procedure and had PAS hose on  throughout the procedure.

## 2016-06-02 NOTE — Transfer of Care (Signed)
Immediate Anesthesia Transfer of Care Note  Patient: Tanya [name]  Procedure(s) Performed: Procedure(s): HYSTERECTOMY TOTAL LAPAROSCOPIC (N/A) LAPAROSCOPIC SALPINGO OOPHORECTOMY (Bilateral)  Patient Location: PACU  Anesthesia Type:General  Level of Consciousness: awake, alert  and oriented  Airway & Oxygen Therapy: Patient Spontanous Breathing and Patient connected to nasal cannula oxygen  Post-op Assessment: Report given to RN and Post -op Vital signs reviewed and stable  Post vital signs: Reviewed and stable  Last Vitals:  Filed Vitals:   06/02/16 0736  BP: 149/83  Pulse: 83  Temp: 36.5 C  Resp: 16    Last Pain: There were no vitals filed for this visit.       Complications: No apparent anesthesia complications

## 2016-06-02 NOTE — Anesthesia Procedure Notes (Signed)
Procedure Name: Intubation Date/Time: 06/02/2016 9:16 AM Performed by: Jonna Munro Pre-anesthesia Checklist: Patient identified, Emergency Drugs available, Suction available, Patient being monitored and Timeout performed Patient Re-evaluated:Patient Re-evaluated prior to inductionOxygen Delivery Method: Circle system utilized Preoxygenation: Pre-oxygenation with 100% oxygen Intubation Type: IV induction Ventilation: Oral airway inserted - appropriate to patient size Laryngoscope Size: Mac and 3 Grade View: Grade II Tube type: Oral Tube size: 7.0 mm Number of attempts: 1 Airway Equipment and Method: Stylet Secured at: 21 cm Tube secured with: Tape Dental Injury: Teeth and Oropharynx as per pre-operative assessment

## 2016-06-02 NOTE — Interval H&P Note (Signed)
History and Physical Interval Note:  06/02/2016 8:56 AM  Tanya [name]  has presented today for surgery, with the diagnosis of Complex Atypical Hyerplasia of Endometrium  The various methods of treatment have been discussed with the patient and family. After consideration of risks, benefits and other options for treatment, the patient has consented to  Procedure(s): HYSTERECTOMY TOTAL LAPAROSCOPIC (N/A) LAPAROSCOPIC SALPINGO OOPHORECTOMY (Bilateral) as a surgical intervention .  The patient's history has been reviewed, patient examined, no change in status, stable for surgery.  I have reviewed the patient's chart and labs.  Questions were answered to the patient's satisfaction.     Chrisy Hillebrand D

## 2016-06-02 NOTE — Progress Notes (Signed)
Discharge teaching complete. Pt understood all instructions and did not have any questions. Pt pushed via wheelchair and discharged home to family. 

## 2016-06-02 NOTE — Anesthesia Preprocedure Evaluation (Addendum)
Anesthesia Evaluation  Patient identified by MRN, date of birth, ID band Patient awake    Reviewed: Allergy & Precautions, H&P , Patient's Chart, lab work & pertinent test results, reviewed documented beta blocker date and time   Airway Mallampati: II  TM Distance: >3 FB Neck ROM: full    Dental no notable dental hx. (+) Teeth Intact   Pulmonary former smoker,  Will use inhaler prior to OR. Uses CPAP at home with level of 11-13.   Pulmonary exam normal        Cardiovascular hypertension, Pt. on medications Normal cardiovascular exam     Neuro/Psych negative psych ROS   GI/Hepatic negative GI ROS, Neg liver ROS,   Endo/Other  Morbid obesity  Renal/GU negative Renal ROS     Musculoskeletal   Abdominal (+) + obese,   Peds  Hematology negative hematology ROS (+)   Anesthesia Other Findings   Reproductive/Obstetrics negative OB ROS                            Anesthesia Physical Anesthesia Plan  ASA: III  Anesthesia Plan: General   Post-op Pain Management:    Induction: Intravenous  Airway Management Planned: Oral ETT  Additional Equipment:   Intra-op Plan:   Post-operative Plan: Extubation in OR  Informed Consent: I have reviewed the patients History and Physical, chart, labs and discussed the procedure including the risks, benefits and alternatives for the proposed anesthesia with the patient or authorized representative who has indicated his/her understanding and acceptance.   Dental Advisory Given  Plan Discussed with: CRNA and Surgeon  Anesthesia Plan Comments:         Anesthesia Quick Evaluation

## 2016-06-03 NOTE — Anesthesia Postprocedure Evaluation (Signed)
Anesthesia Post Note  Patient: Tanya [name]  Procedure(s) Performed: Procedure(s) (LRB): HYSTERECTOMY TOTAL LAPAROSCOPIC (N/A) LAPAROSCOPIC SALPINGO OOPHORECTOMY (Bilateral)  Patient location during evaluation: PACU Anesthesia Type: General Level of consciousness: awake Pain management: pain level controlled Vital Signs Assessment: post-procedure vital signs reviewed and stable Respiratory status: spontaneous breathing Cardiovascular status: stable Postop Assessment: no signs of nausea or vomiting Anesthetic complications: no     Last Vitals:  Filed Vitals:   06/02/16 1320 06/02/16 1422  BP: 121/64 101/50  Pulse: 82 84  Temp: 36.4 C 36.8 C  Resp: 18 16    Last Pain:  Filed Vitals:   06/02/16 1824  PainSc: 3    Pain Goal: Patients Stated Pain Goal: 4 (06/02/16 1815)               [place]

## 2016-06-05 ENCOUNTER — Encounter (HOSPITAL_COMMUNITY): Payer: Self-pay | Admitting: Obstetrics and Gynecology

## 2016-06-30 ENCOUNTER — Encounter: Payer: Self-pay | Admitting: Internal Medicine

## 2016-07-10 ENCOUNTER — Other Ambulatory Visit: Payer: Self-pay | Admitting: Internal Medicine

## 2016-07-10 NOTE — Telephone Encounter (Signed)
Done hardcopy to Corinne  

## 2016-07-10 NOTE — Telephone Encounter (Signed)
Medication refill sent to pharmacy  

## 2016-07-31 ENCOUNTER — Other Ambulatory Visit: Payer: Self-pay | Admitting: Internal Medicine

## 2016-08-18 ENCOUNTER — Encounter: Payer: Self-pay | Admitting: Internal Medicine

## 2016-08-22 ENCOUNTER — Ambulatory Visit: Payer: Self-pay | Admitting: Orthopedic Surgery

## 2016-08-26 ENCOUNTER — Ambulatory Visit: Payer: Self-pay | Admitting: Orthopedic Surgery

## 2016-08-26 NOTE — H&P (Signed)
TOTAL HIP ADMISSION H&P  Patient is admitted for right total hip arthroplasty.  Subjective:  Chief Complaint: right hip pain  HPI: Tanya [name], 60 y.o. female, has a history of pain and functional disability in the right hip(s) due to arthritis and patient has failed non-surgical conservative treatments for greater than 12 weeks to include NSAID's and/or analgesics, flexibility and strengthening excercises, use of assistive devices, weight reduction as appropriate and activity modification.  Onset of symptoms was abrupt starting 1 years ago with rapidlly worsening course since that time.The patient noted no past surgery on the right hip(s).  Patient currently rates pain in the right hip at 10 out of 10 with activity. Patient has night pain, worsening of pain with activity and weight bearing, pain that interfers with activities of daily living and crepitus. Patient has evidence of subchondral cysts, subchondral sclerosis, periarticular osteophytes and joint space narrowing by imaging studies. This condition presents safety issues increasing the risk of falls. There is no current active infection.  Patient Active Problem List   Diagnosis Date Noted  . S/P laparoscopic hysterectomy 06/02/2016  . Right cervical radiculopathy 02/08/2016  . Melanoma of multiple sites ([place]) 02/03/2014  . Right knee pain 02/03/2014  . 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 Hearing loss 01/21/2013  . Hemifacial spasm 09/18/2011  . Impaired glucose tolerance 03/16/2011  . Preventative health care 03/16/2011  . ALLERGIC RHINITIS 01/11/2008  . Obstructive sleep apnea 09/07/2007  . Essential hypertension 09/07/2007  . Asthma 09/07/2007  . OSTEOARTHRITIS 09/07/2007   Past Medical History:  Diagnosis Date  . Allergic rhinitis   . Blepharospasm 10/09/2010  . Cancer (HCC)    Skin  . Hip pain   . History of melanoma excision    MULTIPLE SITES  . Hypertension   . Mild intermittent asthma   . OA (osteoarthritis)   . PMB  (postmenopausal bleeding)   . PONV (postoperative nausea and vomiting)    did not experience after D&C, request to use same medicaitons from that procedure  . Precancerous changes of the cervix   . Sleep apnea   . Varicose vein    wears compression stocking    Past Surgical History:  Procedure Laterality Date  . COLONOSCOPY    . DILATION AND CURETTAGE OF UTERUS    . ESOPHAGOGASTRODUODENOSCOPY  10/14/2004  . HAMMER TOE SURGERY    . HYSTEROSCOPY W/D&C N/A 04/07/2016   Procedure: DILATATION AND CURETTAGE /HYSTEROSCOPY WITH MYOSURE POLYPECTOMY;  Surgeon: Cheri Fowler, MD;  Location: [facility];  Service: Gynecology;  Laterality: N/A;  . KNEE ARTHROSCOPY Bilateral left 08-01-2005/  right  . LAPAROSCOPIC HYSTERECTOMY N/A 06/02/2016   Procedure: HYSTERECTOMY TOTAL LAPAROSCOPIC;  Surgeon: Cheri Fowler, MD;  Location: [facility];  Service: Gynecology;  Laterality: N/A;  . LAPAROSCOPIC SALPINGO OOPHERECTOMY Bilateral 06/02/2016   Procedure: LAPAROSCOPIC SALPINGO OOPHORECTOMY;  Surgeon: Cheri Fowler, MD;  Location: [facility];  Service: Gynecology;  Laterality: Bilateral;  . RE-DO ROTATOR CUFF REPAIR/  SAD/  ACROMIOPLASTY Right 01-04-2009  . SHOULDER ARTHROSCOPY W/ ROTATOR CUFF REPAIR Right   . tailor bunion removal    . TOTAL KNEE ARTHROPLASTY Left 12/29/2013   Procedure: LEFT TOTAL KNEE ARTHROPLASTY;  Surgeon: Johnn Hai, MD;  Location: [facility];  Service: Orthopedics;  Laterality: Left;     (Not in a hospital admission) Allergies  Allergen Reactions  . Ace Inhibitors Hives    hives  . Penicillins Hives    Has patient had a PCN reaction causing immediate rash,  facial/tongue/throat swelling, SOB or lightheadedness with hypotension: Yes Has patient had a PCN reaction causing severe rash involving mucus membranes or skin necrosis: No Has patient had a PCN reaction that required hospitalization No Has patient had a PCN reaction occurring within the last 10 years: No If all of the  above answers are "NO", then may proceed with Cephalosporin use.   . Pseudoephedrine-Codeine Hives    Social History  Substance Use Topics  . Smoking status: Former Smoker    Packs/day: 2.00    Years: 10.00    Types: Cigarettes    Quit date: 11/25/1987  . Smokeless tobacco: Never Used  . Alcohol use 1.2 oz/week    2 Glasses of wine per week     Comment: occ    Family History  Problem Relation Age of Onset  . Esophageal cancer Father   . Melanoma Sister   . Diabetes Other   . Ovarian cancer Other   . Asthma Other     siblings     Review of Systems  Constitutional: Negative.   HENT: Negative.   Eyes: Negative.   Respiratory: Negative.   Cardiovascular: Negative.   Gastrointestinal: Negative.   Genitourinary: Positive for frequency.  Musculoskeletal: Positive for joint pain.  Skin: Negative.   Neurological: Negative.   Endo/Heme/Allergies: Positive for environmental allergies.  Psychiatric/Behavioral: Negative.     Objective:  Physical Exam  Vitals reviewed. Constitutional: She is oriented to person, place, and time. She appears well-developed and well-nourished.  HENT:  Head: Normocephalic and atraumatic.  Eyes: Conjunctivae and EOM are normal.  Neck: Normal range of motion. Neck supple.  Cardiovascular: Normal rate, regular rhythm and intact distal pulses.   Respiratory: Effort normal. No respiratory distress.  GI: Soft. Bowel sounds are normal. She exhibits no distension.  Genitourinary:  Genitourinary Comments: deferred  Musculoskeletal:       Right hip: She exhibits decreased range of motion, decreased strength and crepitus.  Neurological: She is alert and oriented to person, place, and time. She has normal reflexes.  Skin: Skin is warm and dry.  Psychiatric: She has a normal mood and affect. Her behavior is normal. Judgment and thought content normal.    Vital signs in last 24 hours: @VSRANGES @  Labs:   Estimated body mass index is 38.06 kg/m as  calculated from the following:   Height as of 06/02/16: 5\' 7"  (1.702 m).   Weight as of 06/02/16: 110.2 kg (243 lb).   Imaging Review Plain radiographs demonstrate severe degenerative joint disease of the right hip(s). The bone quality appears to be adequate for age and reported activity level.  Assessment/Plan:  End stage arthritis, right hip(s)  The patient history, physical examination, clinical judgement of the provider and imaging studies are consistent with end stage degenerative joint disease of the right hip(s) and total hip arthroplasty is deemed medically necessary. The treatment options including medical management, injection therapy, arthroscopy and arthroplasty were discussed at length. The risks and benefits of total hip arthroplasty were presented and reviewed. The risks due to aseptic loosening, infection, stiffness, dislocation/subluxation,  thromboembolic complications and other imponderables were discussed.  The patient acknowledged the explanation, agreed to proceed with the plan and consent was signed. Patient is being admitted for inpatient treatment for surgery, pain control, PT, OT, prophylactic antibiotics, VTE prophylaxis, progressive ambulation and ADL's and discharge planning.The patient is planning to be discharged home with home health services

## 2016-08-30 NOTE — Pre-Procedure Instructions (Signed)
Tanya [name]  08/30/2016     Your procedure is scheduled on October 20.  Report to [facility] Admitting at 9:45 A.M.  Call this number if you have problems the morning of surgery:  [phone]   Remember:  Do not eat food or drink liquids after midnight.  Take these medicines the morning of surgery with A SIP OF WATER Albuterol (if needed), Amlodipine, Zyrtec, Singulair, Tramadol (if needed)   STOP Multiple Vitamins, Glucosamine- Chondroitin, Celebrex, Aspirin October 13   STOP/ Do not take Aspirin, Aleve, Naproxen, Advil, Ibuprofen, Motrin, Vitamins, Herbs, or Supplements starting October 13   Do not wear jewelry, make-up or nail polish.  Do not wear lotions, powders, or perfumes, or deoderant.  Do not shave 48 hours prior to surgery.  Men may shave face and neck.  Do not bring valuables to the hospital.  [facility] is not responsible for any belongings or valuables.  Contacts, dentures or bridgework may not be worn into surgery.  Leave your suitcase in the car.  After surgery it may be brought to your room.  For patients admitted to the hospital, discharge time will be determined by your treatment team.  Patients discharged the day of surgery will not be allowed to drive home.   [place] - Preparing for Surgery  Before surgery, you can play an important role.  Because skin is not sterile, your skin needs to be as free of germs as possible.  You can reduce the number of germs on you skin by washing with CHG (chlorahexidine gluconate) soap before surgery.  CHG is an antiseptic cleaner which kills germs and bonds with the skin to continue killing germs even after washing.  Please DO NOT use if you have an allergy to CHG or antibacterial soaps.  If your skin becomes reddened/irritated stop using the CHG and inform your nurse when you arrive at Short Stay.  Do not shave (including legs and underarms) for at least 48 hours prior to the first CHG shower.  You may  shave your face.  Please follow these instructions carefully:   1.  Shower with CHG Soap the night before surgery and the morning of Surgery.  2.  If you choose to wash your hair, wash your hair first as usual with your normal shampoo.  3.  After you shampoo, rinse your hair and body thoroughly to remove the shampoo.  4.  Use CHG as you would any other liquid soap.  You can apply CHG directly to the skin and wash gently with scrungie or a clean washcloth.  5.  Apply the CHG Soap to your body ONLY FROM THE NECK DOWN.  Do not use on open wounds or open sores.  Avoid contact with your eyes, ears, mouth and genitals (private parts).  Wash genitals (private parts) with your normal soap.  6.  Wash thoroughly, paying special attention to the area where your surgery will be performed.  7.  Thoroughly rinse your body with warm water from the neck down.  8.  DO NOT shower/wash with your normal soap after using and rinsing off the CHG Soap.  9.  Pat yourself dry with a clean towel.            10.  Wear clean pajamas.            11.  Place clean sheets on your bed the night of your first shower and do not sleep with pets.  Day  of Surgery  Do not apply any lotions the morning of surgery.  Please wear clean clothes to the hospital/surgery center.

## 2016-09-01 ENCOUNTER — Encounter (HOSPITAL_COMMUNITY): Payer: Self-pay

## 2016-09-01 ENCOUNTER — Encounter (HOSPITAL_COMMUNITY)
Admission: RE | Admit: 2016-09-01 | Discharge: 2016-09-01 | Disposition: A | Discharge status: Home / Self Care | Payer: Managed Care, Other (non HMO) | Source: Ambulatory Visit | Attending: Orthopedic Surgery | Admitting: Orthopedic Surgery

## 2016-09-01 DIAGNOSIS — Z01812 Encounter for preprocedural laboratory examination: Secondary | ICD-10-CM | POA: Diagnosis not present

## 2016-09-01 LAB — CBC
HCT: 44.2 % (ref 36.0–46.0)
Hemoglobin: 14.4 g/dL (ref 12.0–15.0)
MCH: 29.7 pg (ref 26.0–34.0)
MCHC: 32.6 g/dL (ref 30.0–36.0)
MCV: 91.1 fL (ref 78.0–100.0)
PLATELETS: 184 10*3/uL (ref 150–400)
RBC: 4.85 MIL/uL (ref 3.87–5.11)
RDW: 13.6 % (ref 11.5–15.5)
WBC: 6.5 10*3/uL (ref 4.0–10.5)

## 2016-09-01 LAB — TYPE AND SCREEN
ABO/RH(D): O POS
ANTIBODY SCREEN: NEGATIVE

## 2016-09-01 LAB — COMPREHENSIVE METABOLIC PANEL
ALT: 29 U/L (ref 14–54)
ANION GAP: 8 (ref 5–15)
AST: 22 U/L (ref 15–41)
Albumin: 4.1 g/dL (ref 3.5–5.0)
Alkaline Phosphatase: 70 U/L (ref 38–126)
BUN: 13 mg/dL (ref 6–20)
CALCIUM: 9.7 mg/dL (ref 8.9–10.3)
CHLORIDE: 109 mmol/L (ref 101–111)
CO2: 24 mmol/L (ref 22–32)
Creatinine, Ser: 0.62 mg/dL (ref 0.44–1.00)
GFR calc non Af Amer: >60 mL/min (ref >60)
Glucose, Bld: 118 mg/dL — ABNORMAL HIGH (ref 65–99)
Potassium: 4.1 mmol/L (ref 3.5–5.1)
SODIUM: 141 mmol/L (ref 135–145)
Total Bilirubin: 0.7 mg/dL (ref 0.3–1.2)
Total Protein: 6.8 g/dL (ref 6.5–8.1)

## 2016-09-01 LAB — SURGICAL PCR SCREEN
MRSA, PCR: NEGATIVE
STAPHYLOCOCCUS AUREUS: NEGATIVE

## 2016-09-01 LAB — ABO/RH: ABO/RH(D): O POS

## 2016-09-01 NOTE — Progress Notes (Signed)
Denies any heart related issues. PCP is Dr. Cathlean Cower  LOV 12/2015 Dr. Toy Cookey, at [facility] does facial injections on right side of face for severe twitching, dx 5 yrs ago Dr. Elsworth Soho is for her OSA.  In home monitoring done about 2 yrs ago.  She does wear the CPAP and believes the setting are 11 or 13 cm

## 2016-09-02 ENCOUNTER — Encounter: Payer: Self-pay | Admitting: Internal Medicine

## 2016-09-11 MED ORDER — TRANEXAMIC ACID 1000 MG/10ML IV SOLN
1000.0000 mg | INTRAVENOUS | Status: AC
  Administered 2016-09-12: 1000 mg via INTRAVENOUS
  Filled 2016-09-11: qty 10

## 2016-09-11 MED ORDER — TRANEXAMIC ACID 1000 MG/10ML IV SOLN
1000.0000 mg | INTRAVENOUS | Status: DC
  Filled 2016-09-11: qty 10

## 2016-09-11 MED ORDER — CLINDAMYCIN PHOSPHATE 900 MG/50ML IV SOLN
900.0000 mg | INTRAVENOUS | Status: AC
  Administered 2016-09-12: 900 mg via INTRAVENOUS
  Filled 2016-09-11: qty 50

## 2016-09-11 MED ORDER — ACETAMINOPHEN 10 MG/ML IV SOLN: 1000.0000 mg | INTRAVENOUS | Status: DC

## 2016-09-11 MED ORDER — SODIUM CHLORIDE 0.9 % IV SOLN
INTRAVENOUS | Status: DC
  Administered 2016-09-12: 08:00:00 via INTRAVENOUS

## 2016-09-11 MED ORDER — VANCOMYCIN HCL 10 G IV SOLR
1500.0000 mg | INTRAVENOUS | Status: AC
  Administered 2016-09-12: 1500 mg via INTRAVENOUS
  Filled 2016-09-11: qty 1500

## 2016-09-11 MED ORDER — ACETAMINOPHEN 10 MG/ML IV SOLN
1000.0000 mg | INTRAVENOUS | Status: AC
  Administered 2016-09-12: 1000 mg via INTRAVENOUS
  Filled 2016-09-11: qty 100

## 2016-09-11 NOTE — Anesthesia Preprocedure Evaluation (Addendum)
Anesthesia Evaluation  Patient identified by MRN, date of birth, ID band Patient awake    Reviewed: Allergy & Precautions, NPO status , Patient's Chart, lab work & pertinent test results  History of Anesthesia Complications (+) PONV and history of anesthetic complications  Airway Mallampati: III  TM Distance: >3 FB Neck ROM: Full   Comment: Previous grade II view with MAC 3 Dental  (+) Teeth Intact, Dental Advisory Given   Pulmonary neg shortness of breath, asthma (has not needed albuterol in 5 years, uses Singulair every day, used this morning) , sleep apnea and Continuous Positive Airway Pressure Ventilation , neg COPD, neg recent URI, former smoker,    Pulmonary exam normal breath sounds clear to auscultation       Cardiovascular hypertension, Pt. on medications (-) angina(-) Past MI, (-) Cardiac Stents, (-) Orthopnea and (-) PND  Rhythm:Regular Rate:Normal     Neuro/Psych neg Seizures Right cervical radiculopathy with intermittent numbness in right hand    GI/Hepatic negative GI ROS, Neg liver ROS,   Endo/Other  negative endocrine ROS  Renal/GU negative Renal ROS     Musculoskeletal  (+) Arthritis , Osteoarthritis,    Abdominal (+) + obese,   Peds  Hematology negative hematology ROS (+)   Anesthesia Other Findings Allergic rhinitis, receives Botox injections to the right side of the face to treat spasms  Reproductive/Obstetrics                            Anesthesia Physical Anesthesia Plan  ASA: III  Anesthesia Plan: Spinal   Post-op Pain Management:    Induction: Intravenous  Airway Management Planned: Natural Airway and Simple Face Mask  Additional Equipment:   Intra-op Plan:   Post-operative Plan:   Informed Consent: I have reviewed the patients History and Physical, chart, labs and discussed the procedure including the risks, benefits and alternatives for the proposed  anesthesia with the patient or authorized representative who has indicated his/her understanding and acceptance.   Dental advisory given  Plan Discussed with: CRNA  Anesthesia Plan Comments: (I have discussed risks of neuraxial anesthesia including but not limited to infection, bleeding, nerve injury, back pain, headache, seizures, and failure of block. Patient denies bleeding disorders and is not currently anticoagulated. Labs have been reviewed. Risks and benefits discussed. All patient's questions answered.   Platelets 184)       Anesthesia Quick Evaluation

## 2016-09-12 ENCOUNTER — Inpatient Hospital Stay (HOSPITAL_COMMUNITY): Payer: Managed Care, Other (non HMO)

## 2016-09-12 ENCOUNTER — Encounter (HOSPITAL_COMMUNITY)
Admission: RE | Disposition: A | Discharge status: Home Health | Payer: Self-pay | Source: Ambulatory Visit | Attending: Orthopedic Surgery

## 2016-09-12 ENCOUNTER — Ambulatory Visit (HOSPITAL_COMMUNITY): Payer: Managed Care, Other (non HMO)

## 2016-09-12 ENCOUNTER — Ambulatory Visit (HOSPITAL_COMMUNITY): Payer: Managed Care, Other (non HMO) | Admitting: Anesthesiology

## 2016-09-12 ENCOUNTER — Inpatient Hospital Stay (HOSPITAL_COMMUNITY)
Admission: RE | Admit: 2016-09-12 | Discharge: 2016-09-13 | DRG: 470 | Disposition: A | Discharge status: Home Health | Payer: Managed Care, Other (non HMO) | Source: Ambulatory Visit | Attending: Orthopedic Surgery | Admitting: Orthopedic Surgery

## 2016-09-12 ENCOUNTER — Encounter (HOSPITAL_COMMUNITY): Payer: Self-pay | Admitting: *Deleted

## 2016-09-12 DIAGNOSIS — Z880 Allergy status to penicillin: Secondary | ICD-10-CM

## 2016-09-12 DIAGNOSIS — Z87891 Personal history of nicotine dependence: Secondary | ICD-10-CM | POA: Diagnosis not present

## 2016-09-12 DIAGNOSIS — J4520 Mild intermittent asthma, uncomplicated: Secondary | ICD-10-CM | POA: Diagnosis present

## 2016-09-12 DIAGNOSIS — I10 Essential (primary) hypertension: Secondary | ICD-10-CM | POA: Diagnosis present

## 2016-09-12 DIAGNOSIS — I878 Other specified disorders of veins: Secondary | ICD-10-CM | POA: Diagnosis present

## 2016-09-12 DIAGNOSIS — Z85828 Personal history of other malignant neoplasm of skin: Secondary | ICD-10-CM | POA: Diagnosis not present

## 2016-09-12 DIAGNOSIS — J309 Allergic rhinitis, unspecified: Secondary | ICD-10-CM | POA: Diagnosis present

## 2016-09-12 DIAGNOSIS — M1611 Unilateral primary osteoarthritis, right hip: Principal | ICD-10-CM | POA: Diagnosis present

## 2016-09-12 DIAGNOSIS — Z419 Encounter for procedure for purposes other than remedying health state, unspecified: Secondary | ICD-10-CM

## 2016-09-12 DIAGNOSIS — H9190 Unspecified hearing loss, unspecified ear: Secondary | ICD-10-CM | POA: Diagnosis present

## 2016-09-12 DIAGNOSIS — Z09 Encounter for follow-up examination after completed treatment for conditions other than malignant neoplasm: Secondary | ICD-10-CM

## 2016-09-12 DIAGNOSIS — G4730 Sleep apnea, unspecified: Secondary | ICD-10-CM | POA: Diagnosis present

## 2016-09-12 HISTORY — PX: TOTAL HIP ARTHROPLASTY: SHX124

## 2016-09-12 SURGERY — ARTHROPLASTY, HIP, TOTAL, ANTERIOR APPROACH
Anesthesia: Spinal | Site: Hip | Laterality: Right

## 2016-09-12 MED ORDER — LACTATED RINGERS IV SOLN
INTRAVENOUS | Status: DC | PRN
  Administered 2016-09-12 (×3): via INTRAVENOUS

## 2016-09-12 MED ORDER — SODIUM CHLORIDE 0.9 % IR SOLN
Status: DC | PRN
  Administered 2016-09-12: 1000 mL

## 2016-09-12 MED ORDER — SODIUM CHLORIDE 0.9 % IR SOLN
Status: DC | PRN
  Administered 2016-09-12: 3000 mL

## 2016-09-12 MED ORDER — MONTELUKAST SODIUM 10 MG PO TABS: 10.0000 mg | ORAL_TABLET | Freq: Every day | ORAL | Status: DC

## 2016-09-12 MED ORDER — HYDROCODONE-ACETAMINOPHEN 5-325 MG PO TABS
1.0000 | ORAL_TABLET | ORAL | Status: DC | PRN
  Administered 2016-09-13 (×2): 2 via ORAL
  Filled 2016-09-12 (×2): qty 2

## 2016-09-12 MED ORDER — MENTHOL 3 MG MT LOZG: 1.0000 | LOZENGE | OROMUCOSAL | Status: DC | PRN

## 2016-09-12 MED ORDER — SENNA 8.6 MG PO TABS: 2.0000 | ORAL_TABLET | Freq: Every day | ORAL | 3 refills | Status: DC

## 2016-09-12 MED ORDER — KETOROLAC TROMETHAMINE 30 MG/ML IJ SOLN
INTRAMUSCULAR | Status: DC | PRN
  Administered 2016-09-12: 30 mg via INTRA_ARTICULAR

## 2016-09-12 MED ORDER — ONDANSETRON HCL 4 MG PO TABS: 4.0000 mg | ORAL_TABLET | Freq: Four times a day (QID) | ORAL | Status: DC | PRN

## 2016-09-12 MED ORDER — MIDAZOLAM HCL 2 MG/2ML IJ SOLN
INTRAMUSCULAR | Status: AC
Start: 2016-09-12 — End: 2016-09-12
  Filled 2016-09-12: qty 2

## 2016-09-12 MED ORDER — FENTANYL CITRATE (PF) 100 MCG/2ML IJ SOLN
INTRAMUSCULAR | Status: AC
  Filled 2016-09-12: qty 2

## 2016-09-12 MED ORDER — CHLORHEXIDINE GLUCONATE 4 % EX LIQD: 60.0000 mL | Freq: Once | CUTANEOUS | Status: DC

## 2016-09-12 MED ORDER — 0.9 % SODIUM CHLORIDE (POUR BTL) OPTIME
TOPICAL | Status: DC | PRN
  Administered 2016-09-12: 1000 mL

## 2016-09-12 MED ORDER — ONDANSETRON HCL 4 MG/2ML IJ SOLN
INTRAMUSCULAR | Status: DC | PRN
  Administered 2016-09-12: 4 mg via INTRAVENOUS

## 2016-09-12 MED ORDER — KETOROLAC TROMETHAMINE 30 MG/ML IJ SOLN
INTRAMUSCULAR | Status: AC
  Filled 2016-09-12: qty 1

## 2016-09-12 MED ORDER — AMLODIPINE BESYLATE 10 MG PO TABS
10.0000 mg | ORAL_TABLET | Freq: Every day | ORAL | Status: DC
  Administered 2016-09-13: 10 mg via ORAL
  Filled 2016-09-12 (×2): qty 1

## 2016-09-12 MED ORDER — DOCUSATE SODIUM 100 MG PO CAPS: 100.0000 mg | ORAL_CAPSULE | Freq: Two times a day (BID) | ORAL | 3 refills | Status: DC

## 2016-09-12 MED ORDER — SENNA 8.6 MG PO TABS
2.0000 | ORAL_TABLET | Freq: Every day | ORAL | Status: DC
  Administered 2016-09-12: 17.2 mg via ORAL
  Filled 2016-09-12: qty 2

## 2016-09-12 MED ORDER — METHOCARBAMOL 500 MG PO TABS
500.0000 mg | ORAL_TABLET | Freq: Four times a day (QID) | ORAL | Status: DC | PRN
  Administered 2016-09-13 (×2): 500 mg via ORAL
  Filled 2016-09-12 (×4): qty 1

## 2016-09-12 MED ORDER — ONDANSETRON HCL 4 MG/2ML IJ SOLN
INTRAMUSCULAR | Status: AC
  Filled 2016-09-12: qty 2

## 2016-09-12 MED ORDER — DEXAMETHASONE SODIUM PHOSPHATE 10 MG/ML IJ SOLN
INTRAMUSCULAR | Status: DC | PRN
  Administered 2016-09-12: 10 mg via INTRAVENOUS

## 2016-09-12 MED ORDER — ACETAMINOPHEN 325 MG PO TABS: 650.0000 mg | ORAL_TABLET | Freq: Four times a day (QID) | ORAL | Status: DC | PRN

## 2016-09-12 MED ORDER — FENTANYL CITRATE (PF) 100 MCG/2ML IJ SOLN: 25.0000 ug | INTRAMUSCULAR | Status: DC | PRN

## 2016-09-12 MED ORDER — ONDANSETRON HCL 4 MG PO TABS: 4.0000 mg | ORAL_TABLET | Freq: Three times a day (TID) | ORAL | 0 refills | Status: DC | PRN

## 2016-09-12 MED ORDER — PROMETHAZINE HCL 25 MG/ML IJ SOLN: 6.2500 mg | INTRAMUSCULAR | Status: DC | PRN

## 2016-09-12 MED ORDER — POVIDONE-IODINE 10 % EX SWAB: 2.0000 "application " | Freq: Once | CUTANEOUS | Status: DC

## 2016-09-12 MED ORDER — METOCLOPRAMIDE HCL 5 MG PO TABS
5.0000 mg | ORAL_TABLET | Freq: Three times a day (TID) | ORAL | Status: DC | PRN
Start: 2016-09-12 — End: 2016-09-13

## 2016-09-12 MED ORDER — BUPIVACAINE-EPINEPHRINE (PF) 0.5% -1:200000 IJ SOLN
INTRAMUSCULAR | Status: DC | PRN
  Administered 2016-09-12: 30 mL

## 2016-09-12 MED ORDER — LORATADINE 10 MG PO TABS
10.0000 mg | ORAL_TABLET | Freq: Every day | ORAL | Status: DC
  Administered 2016-09-13: 10 mg via ORAL
  Filled 2016-09-12: qty 1

## 2016-09-12 MED ORDER — FENTANYL CITRATE (PF) 100 MCG/2ML IJ SOLN
INTRAMUSCULAR | Status: DC | PRN
  Administered 2016-09-12: 50 ug via INTRAVENOUS

## 2016-09-12 MED ORDER — DEXAMETHASONE SODIUM PHOSPHATE 10 MG/ML IJ SOLN
INTRAMUSCULAR | Status: AC
  Filled 2016-09-12: qty 1

## 2016-09-12 MED ORDER — POLYETHYLENE GLYCOL 3350 17 G PO PACK: 17.0000 g | PACK | Freq: Every day | ORAL | Status: DC | PRN

## 2016-09-12 MED ORDER — SODIUM CHLORIDE 0.9 % IV SOLN
INTRAVENOUS | Status: DC
  Administered 2016-09-12: 17:00:00 via INTRAVENOUS

## 2016-09-12 MED ORDER — PROPOFOL 10 MG/ML IV BOLUS
INTRAVENOUS | Status: AC
Start: 2016-09-12 — End: 2016-09-12
  Filled 2016-09-12: qty 20

## 2016-09-12 MED ORDER — IRBESARTAN 150 MG PO TABS
150.0000 mg | ORAL_TABLET | Freq: Every day | ORAL | Status: DC
Start: 2016-09-12 — End: 2016-09-13
  Administered 2016-09-12 – 2016-09-13 (×2): 150 mg via ORAL
  Filled 2016-09-12 (×2): qty 1

## 2016-09-12 MED ORDER — SODIUM CHLORIDE 0.9 % IJ SOLN
INTRAMUSCULAR | Status: DC | PRN
  Administered 2016-09-12: 30 mL

## 2016-09-12 MED ORDER — DEXAMETHASONE SODIUM PHOSPHATE 10 MG/ML IJ SOLN
10.0000 mg | Freq: Once | INTRAMUSCULAR | Status: DC
  Filled 2016-09-12: qty 1

## 2016-09-12 MED ORDER — HYDROMORPHONE HCL 2 MG/ML IJ SOLN: 0.5000 mg | INTRAMUSCULAR | Status: DC | PRN

## 2016-09-12 MED ORDER — ASPIRIN 81 MG PO TABS: 81.0000 mg | ORAL_TABLET | Freq: Two times a day (BID) | ORAL | 1 refills | Status: DC

## 2016-09-12 MED ORDER — BUPIVACAINE-EPINEPHRINE (PF) 0.5% -1:200000 IJ SOLN
INTRAMUSCULAR | Status: AC
  Filled 2016-09-12: qty 30

## 2016-09-12 MED ORDER — VANCOMYCIN HCL IN DEXTROSE 1-5 GM/200ML-% IV SOLN
1000.0000 mg | Freq: Two times a day (BID) | INTRAVENOUS | Status: AC
  Administered 2016-09-12: 1000 mg via INTRAVENOUS
  Filled 2016-09-12: qty 200

## 2016-09-12 MED ORDER — PHENOL 1.4 % MT LIQD: 1.0000 | OROMUCOSAL | Status: DC | PRN

## 2016-09-12 MED ORDER — ALBUTEROL SULFATE (2.5 MG/3ML) 0.083% IN NEBU: 3.0000 mL | INHALATION_SOLUTION | Freq: Four times a day (QID) | RESPIRATORY_TRACT | Status: DC | PRN

## 2016-09-12 MED ORDER — HYDROCODONE-ACETAMINOPHEN 5-325 MG PO TABS: 1.0000 | ORAL_TABLET | ORAL | 0 refills | Status: DC | PRN

## 2016-09-12 MED ORDER — LIDOCAINE 2% (20 MG/ML) 5 ML SYRINGE
INTRAMUSCULAR | Status: AC
  Filled 2016-09-12: qty 5

## 2016-09-12 MED ORDER — MIDAZOLAM HCL 5 MG/5ML IJ SOLN
INTRAMUSCULAR | Status: DC | PRN
  Administered 2016-09-12: 2 mg via INTRAVENOUS

## 2016-09-12 MED ORDER — ACETAMINOPHEN 650 MG RE SUPP: 650.0000 mg | Freq: Four times a day (QID) | RECTAL | Status: DC | PRN

## 2016-09-12 MED ORDER — TRANEXAMIC ACID 1000 MG/10ML IV SOLN
1000.0000 mg | Freq: Once | INTRAVENOUS | Status: AC
  Administered 2016-09-12: 1000 mg via INTRAVENOUS
  Filled 2016-09-12: qty 10

## 2016-09-12 MED ORDER — METOCLOPRAMIDE HCL 5 MG/ML IJ SOLN: 5.0000 mg | Freq: Three times a day (TID) | INTRAMUSCULAR | Status: DC | PRN

## 2016-09-12 MED ORDER — PHENYLEPHRINE HCL 10 MG/ML IJ SOLN
INTRAMUSCULAR | Status: DC | PRN
  Administered 2016-09-12 (×6): 80 ug via INTRAVENOUS

## 2016-09-12 MED ORDER — ASPIRIN 81 MG PO CHEW
81.0000 mg | CHEWABLE_TABLET | Freq: Two times a day (BID) | ORAL | Status: DC
  Administered 2016-09-12 – 2016-09-13 (×2): 81 mg via ORAL
  Filled 2016-09-12 (×2): qty 1

## 2016-09-12 MED ORDER — DOCUSATE SODIUM 100 MG PO CAPS
100.0000 mg | ORAL_CAPSULE | Freq: Two times a day (BID) | ORAL | Status: DC
  Administered 2016-09-12 – 2016-09-13 (×2): 100 mg via ORAL
  Filled 2016-09-12 (×3): qty 1

## 2016-09-12 MED ORDER — DEXTROSE 5 % IV SOLN
INTRAVENOUS | Status: DC | PRN
  Administered 2016-09-12: 25 ug/min via INTRAVENOUS

## 2016-09-12 MED ORDER — ONDANSETRON HCL 4 MG/2ML IJ SOLN: 4.0000 mg | Freq: Four times a day (QID) | INTRAMUSCULAR | Status: DC | PRN

## 2016-09-12 MED ORDER — POVIDONE-IODINE 7.5 % EX SOLN
CUTANEOUS | Status: DC | PRN
  Administered 2016-09-12: 1 via TOPICAL

## 2016-09-12 MED ORDER — CALCIUM ACETATE (PHOS BINDER) 667 MG PO CAPS
667.0000 mg | ORAL_CAPSULE | Freq: Every day | ORAL | Status: DC
  Administered 2016-09-12: 667 mg via ORAL
  Filled 2016-09-12: qty 1

## 2016-09-12 MED ORDER — PROPOFOL 500 MG/50ML IV EMUL
INTRAVENOUS | Status: DC | PRN
Start: 2016-09-12 — End: 2016-09-12
  Administered 2016-09-12: 75 ug/kg/min via INTRAVENOUS
  Administered 2016-09-12: 10:00:00 via INTRAVENOUS

## 2016-09-12 MED ORDER — DEXTROSE 5 % IV SOLN
500.0000 mg | Freq: Four times a day (QID) | INTRAVENOUS | Status: DC | PRN
  Filled 2016-09-12: qty 5

## 2016-09-12 SURGICAL SUPPLY — 58 items
ALCOHOL ISOPROPYL (RUBBING) (MISCELLANEOUS) ×3 IMPLANT
BIT DRILL RINGLOC QUICK CONN (BIT) ×1 IMPLANT
BLADE SURG ROTATE 9660 (MISCELLANEOUS) IMPLANT
CAPT HIP TOTAL 2 ×3 IMPLANT
CHLORAPREP W/TINT 26ML (MISCELLANEOUS) ×3 IMPLANT
COVER SURGICAL LIGHT HANDLE (MISCELLANEOUS) ×3 IMPLANT
DERMABOND ADVANCED (GAUZE/BANDAGES/DRESSINGS) ×2
DERMABOND ADVANCED .7 DNX12 (GAUZE/BANDAGES/DRESSINGS) ×1 IMPLANT
DRAPE C-ARM 42X72 X-RAY (DRAPES) ×3 IMPLANT
DRAPE IMP U-DRAPE 54X76 (DRAPES) ×6 IMPLANT
DRAPE STERI IOBAN 125X83 (DRAPES) ×3 IMPLANT
DRAPE U-SHAPE 47X51 STRL (DRAPES) ×9 IMPLANT
DRILL BIT RINGLOC QUICK CONN (BIT) ×2
DRSG AQUACEL AG ADV 3.5X10 (GAUZE/BANDAGES/DRESSINGS) ×3 IMPLANT
ELECT BLADE 4.0 EZ CLEAN MEGAD (MISCELLANEOUS) ×3
ELECT REM PT RETURN 9FT ADLT (ELECTROSURGICAL) ×3
ELECTRODE BLDE 4.0 EZ CLN MEGD (MISCELLANEOUS) ×1 IMPLANT
ELECTRODE REM PT RTRN 9FT ADLT (ELECTROSURGICAL) ×1 IMPLANT
EVACUATOR 1/8 PVC DRAIN (DRAIN) IMPLANT
GLOVE BIO SURGEON STRL SZ8.5 (GLOVE) ×6 IMPLANT
GLOVE BIOGEL PI IND STRL 8.5 (GLOVE) ×1 IMPLANT
GLOVE BIOGEL PI INDICATOR 8.5 (GLOVE) ×2
GOWN STRL REUS W/ TWL LRG LVL3 (GOWN DISPOSABLE) ×2 IMPLANT
GOWN STRL REUS W/TWL 2XL LVL3 (GOWN DISPOSABLE) ×3 IMPLANT
GOWN STRL REUS W/TWL LRG LVL3 (GOWN DISPOSABLE) ×4
HANDPIECE INTERPULSE COAX TIP (DISPOSABLE) ×2
HOOD PEEL AWAY FACE SHEILD DIS (HOOD) ×6 IMPLANT
KIT BASIN OR (CUSTOM PROCEDURE TRAY) ×3 IMPLANT
KIT ROOM TURNOVER OR (KITS) ×3 IMPLANT
MANIFOLD NEPTUNE II (INSTRUMENTS) ×3 IMPLANT
MARKER SKIN DUAL TIP RULER LAB (MISCELLANEOUS) ×6 IMPLANT
NEEDLE 18GX1X1/2 (RX/OR ONLY) (NEEDLE) ×3 IMPLANT
NEEDLE SPNL 18GX3.5 QUINCKE PK (NEEDLE) ×3 IMPLANT
NS IRRIG 1000ML POUR BTL (IV SOLUTION) ×3 IMPLANT
PACK TOTAL JOINT (CUSTOM PROCEDURE TRAY) ×3 IMPLANT
PACK UNIVERSAL I (CUSTOM PROCEDURE TRAY) ×3 IMPLANT
PAD ARMBOARD 7.5X6 YLW CONV (MISCELLANEOUS) ×6 IMPLANT
SAW OSC TIP CART 19.5X105X1.3 (SAW) ×3 IMPLANT
SEALER BIPOLAR AQUA 6.0 (INSTRUMENTS) IMPLANT
SET HNDPC FAN SPRY TIP SCT (DISPOSABLE) ×1 IMPLANT
SOLUTION BETADINE 4OZ (MISCELLANEOUS) ×3 IMPLANT
SUCTION FRAZIER HANDLE 10FR (MISCELLANEOUS) ×2
SUCTION TUBE FRAZIER 10FR DISP (MISCELLANEOUS) ×1 IMPLANT
SUT ETHIBOND NAB CT1 #1 30IN (SUTURE) ×6 IMPLANT
SUT MNCRL AB 3-0 PS2 18 (SUTURE) ×3 IMPLANT
SUT MON AB 2-0 CT1 36 (SUTURE) ×3 IMPLANT
SUT VIC AB 1 CT1 27 (SUTURE) ×2
SUT VIC AB 1 CT1 27XBRD ANBCTR (SUTURE) ×1 IMPLANT
SUT VIC AB 2-0 CT1 27 (SUTURE) ×2
SUT VIC AB 2-0 CT1 TAPERPNT 27 (SUTURE) ×1 IMPLANT
SUT VLOC 180 0 24IN GS25 (SUTURE) ×3 IMPLANT
SYR 50ML LL SCALE MARK (SYRINGE) ×3 IMPLANT
SYRINGE 20CC LL (MISCELLANEOUS) ×3 IMPLANT
TOWEL OR 17X24 6PK STRL BLUE (TOWEL DISPOSABLE) ×3 IMPLANT
TOWEL OR 17X26 10 PK STRL BLUE (TOWEL DISPOSABLE) ×3 IMPLANT
TRAY CATH 16FR W/PLASTIC CATH (SET/KITS/TRAYS/PACK) ×3 IMPLANT
TRAY FOLEY CATH 16FR SILVER (SET/KITS/TRAYS/PACK) IMPLANT
WATER STERILE IRR 1000ML POUR (IV SOLUTION) ×9 IMPLANT

## 2016-09-12 NOTE — Anesthesia Procedure Notes (Signed)
Spinal  Patient location during procedure: OR Start time: 09/12/2016 7:30 AM End time: 09/12/2016 7:35 AM Staffing Anesthesiologist: Nilda Simmer Performed: anesthesiologist  Preanesthetic Checklist Completed: patient identified, surgical consent, pre-op evaluation, timeout performed, IV checked, risks and benefits discussed and monitors and equipment checked Spinal Block Patient position: sitting Prep: Betadine Patient monitoring: heart rate, continuous pulse ox, blood pressure and cardiac monitor Approach: midline Location: L3-4 Injection technique: single-shot Needle Needle type: Pencan  Needle gauge: 24 G Needle length: 9 cm Additional Notes Negative paresthesia. Negative blood return. Positive free-flowing CSF. Expiration date of kit checked and confirmed. Patient tolerated procedure well, without complications.

## 2016-09-12 NOTE — Anesthesia Procedure Notes (Signed)
Procedure Name: MAC Date/Time: 09/12/2016 7:40 AM Performed by: Jenne Campus Pre-anesthesia Checklist: Patient identified, Emergency Drugs available, Suction available, Patient being monitored and Timeout performed Patient Re-evaluated:Patient Re-evaluated prior to inductionOxygen Delivery Method: Simple face mask

## 2016-09-12 NOTE — H&P (View-Only) (Signed)
TOTAL HIP ADMISSION H&P  Patient is admitted for right total hip arthroplasty.  Subjective:  Chief Complaint: right hip pain  HPI: Tanya [name], 60 y.o. female, has a history of pain and functional disability in the right hip(s) due to arthritis and patient has failed non-surgical conservative treatments for greater than 12 weeks to include NSAID's and/or analgesics, flexibility and strengthening excercises, use of assistive devices, weight reduction as appropriate and activity modification.  Onset of symptoms was abrupt starting 1 years ago with rapidlly worsening course since that time.The patient noted no past surgery on the right hip(s).  Patient currently rates pain in the right hip at 10 out of 10 with activity. Patient has night pain, worsening of pain with activity and weight bearing, pain that interfers with activities of daily living and crepitus. Patient has evidence of subchondral cysts, subchondral sclerosis, periarticular osteophytes and joint space narrowing by imaging studies. This condition presents safety issues increasing the risk of falls. There is no current active infection.  Patient Active Problem List   Diagnosis Date Noted  . S/P laparoscopic hysterectomy 06/02/2016  . Right cervical radiculopathy 02/08/2016  . Melanoma of multiple sites ([place]) 02/03/2014  . Right knee pain 02/03/2014  . 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 Hearing loss 01/21/2013  . Hemifacial spasm 09/18/2011  . Impaired glucose tolerance 03/16/2011  . Preventative health care 03/16/2011  . ALLERGIC RHINITIS 01/11/2008  . Obstructive sleep apnea 09/07/2007  . Essential hypertension 09/07/2007  . Asthma 09/07/2007  . OSTEOARTHRITIS 09/07/2007   Past Medical History:  Diagnosis Date  . Allergic rhinitis   . Blepharospasm 10/09/2010  . Cancer (HCC)    Skin  . Hip pain   . History of melanoma excision    MULTIPLE SITES  . Hypertension   . Mild intermittent asthma   . OA (osteoarthritis)   . PMB  (postmenopausal bleeding)   . PONV (postoperative nausea and vomiting)    did not experience after D&C, request to use same medicaitons from that procedure  . Precancerous changes of the cervix   . Sleep apnea   . Varicose vein    wears compression stocking    Past Surgical History:  Procedure Laterality Date  . COLONOSCOPY    . DILATION AND CURETTAGE OF UTERUS    . ESOPHAGOGASTRODUODENOSCOPY  10/14/2004  . HAMMER TOE SURGERY    . HYSTEROSCOPY W/D&C N/A 04/07/2016   Procedure: DILATATION AND CURETTAGE /HYSTEROSCOPY WITH MYOSURE POLYPECTOMY;  Surgeon: Cheri Fowler, MD;  Location: [facility];  Service: Gynecology;  Laterality: N/A;  . KNEE ARTHROSCOPY Bilateral left 08-01-2005/  right  . LAPAROSCOPIC HYSTERECTOMY N/A 06/02/2016   Procedure: HYSTERECTOMY TOTAL LAPAROSCOPIC;  Surgeon: Cheri Fowler, MD;  Location: [facility];  Service: Gynecology;  Laterality: N/A;  . LAPAROSCOPIC SALPINGO OOPHERECTOMY Bilateral 06/02/2016   Procedure: LAPAROSCOPIC SALPINGO OOPHORECTOMY;  Surgeon: Cheri Fowler, MD;  Location: [facility];  Service: Gynecology;  Laterality: Bilateral;  . RE-DO ROTATOR CUFF REPAIR/  SAD/  ACROMIOPLASTY Right 01-04-2009  . SHOULDER ARTHROSCOPY W/ ROTATOR CUFF REPAIR Right   . tailor bunion removal    . TOTAL KNEE ARTHROPLASTY Left 12/29/2013   Procedure: LEFT TOTAL KNEE ARTHROPLASTY;  Surgeon: Johnn Hai, MD;  Location: [facility];  Service: Orthopedics;  Laterality: Left;     (Not in a hospital admission) Allergies  Allergen Reactions  . Ace Inhibitors Hives    hives  . Penicillins Hives    Has patient had a PCN reaction causing immediate rash,  facial/tongue/throat swelling, SOB or lightheadedness with hypotension: Yes Has patient had a PCN reaction causing severe rash involving mucus membranes or skin necrosis: No Has patient had a PCN reaction that required hospitalization No Has patient had a PCN reaction occurring within the last 10 years: No If all of the  above answers are "NO", then may proceed with Cephalosporin use.   . Pseudoephedrine-Codeine Hives    Social History  Substance Use Topics  . Smoking status: Former Smoker    Packs/day: 2.00    Years: 10.00    Types: Cigarettes    Quit date: 11/25/1987  . Smokeless tobacco: Never Used  . Alcohol use 1.2 oz/week    2 Glasses of wine per week     Comment: occ    Family History  Problem Relation Age of Onset  . Esophageal cancer Father   . Melanoma Sister   . Diabetes Other   . Ovarian cancer Other   . Asthma Other     siblings     Review of Systems  Constitutional: Negative.   HENT: Negative.   Eyes: Negative.   Respiratory: Negative.   Cardiovascular: Negative.   Gastrointestinal: Negative.   Genitourinary: Positive for frequency.  Musculoskeletal: Positive for joint pain.  Skin: Negative.   Neurological: Negative.   Endo/Heme/Allergies: Positive for environmental allergies.  Psychiatric/Behavioral: Negative.     Objective:  Physical Exam  Vitals reviewed. Constitutional: She is oriented to person, place, and time. She appears well-developed and well-nourished.  HENT:  Head: Normocephalic and atraumatic.  Eyes: Conjunctivae and EOM are normal.  Neck: Normal range of motion. Neck supple.  Cardiovascular: Normal rate, regular rhythm and intact distal pulses.   Respiratory: Effort normal. No respiratory distress.  GI: Soft. Bowel sounds are normal. She exhibits no distension.  Genitourinary:  Genitourinary Comments: deferred  Musculoskeletal:       Right hip: She exhibits decreased range of motion, decreased strength and crepitus.  Neurological: She is alert and oriented to person, place, and time. She has normal reflexes.  Skin: Skin is warm and dry.  Psychiatric: She has a normal mood and affect. Her behavior is normal. Judgment and thought content normal.    Vital signs in last 24 hours: @VSRANGES @  Labs:   Estimated body mass index is 38.06 kg/m as  calculated from the following:   Height as of 06/02/16: 5\' 7"  (1.702 m).   Weight as of 06/02/16: 110.2 kg (243 lb).   Imaging Review Plain radiographs demonstrate severe degenerative joint disease of the right hip(s). The bone quality appears to be adequate for age and reported activity level.  Assessment/Plan:  End stage arthritis, right hip(s)  The patient history, physical examination, clinical judgement of the provider and imaging studies are consistent with end stage degenerative joint disease of the right hip(s) and total hip arthroplasty is deemed medically necessary. The treatment options including medical management, injection therapy, arthroscopy and arthroplasty were discussed at length. The risks and benefits of total hip arthroplasty were presented and reviewed. The risks due to aseptic loosening, infection, stiffness, dislocation/subluxation,  thromboembolic complications and other imponderables were discussed.  The patient acknowledged the explanation, agreed to proceed with the plan and consent was signed. Patient is being admitted for inpatient treatment for surgery, pain control, PT, OT, prophylactic antibiotics, VTE prophylaxis, progressive ambulation and ADL's and discharge planning.The patient is planning to be discharged home with home health services

## 2016-09-12 NOTE — Discharge Instructions (Signed)
°Dr. Leonell Lobdell °Joint Replacement Specialist °[facility] °[address] °[place], [place] [address] °[phone] ° ° °TOTAL HIP REPLACEMENT POSTOPERATIVE DIRECTIONS ° ° ° °Hip Rehabilitation, Guidelines Following Surgery  ° °WEIGHT BEARING °Weight bearing as tolerated with assist device (walker, cane, etc) as directed, use it as long as suggested by your surgeon or therapist, typically at least 4-6 weeks. ° °The results of a hip operation are greatly improved after range of motion and muscle strengthening exercises. Follow all safety measures which are given to protect your hip. If any of these exercises cause increased pain or swelling in your joint, decrease the amount until you are comfortable again. Then slowly increase the exercises. Call your caregiver if you have problems or questions.  ° °HOME CARE INSTRUCTIONS  °Most of the following instructions are designed to prevent the dislocation of your new hip.  °Remove items at home which could result in a fall. This includes throw rugs or furniture in walking pathways.  °Continue medications as instructed at time of discharge. °· You may have some home medications which will be placed on hold until you complete the course of blood thinner medication. °· You may start showering once you are discharged home. Do not remove your dressing. °Do not put on socks or shoes without following the instructions of your caregivers.   °Sit on chairs with arms. Use the chair arms to help push yourself up when arising.  °Arrange for the use of a toilet seat elevator so you are not sitting low.  °· Walk with walker as instructed.  °You may resume a sexual relationship in one month or when given the OK by your caregiver.  °Use walker as long as suggested by your caregivers.  °You may put full weight on your legs and walk as much as is comfortable. °Avoid periods of inactivity such as sitting longer than an hour when not asleep. This helps prevent  blood clots.  °You may return to work once you are cleared by your surgeon.  °Do not drive a car for 6 weeks or until released by your surgeon.  °Do not drive while taking narcotics.  °Wear elastic stockings for two weeks following surgery during the day but you may remove then at night.  °Make sure you keep all of your appointments after your operation with all of your doctors and caregivers. You should call the office at the above phone number and make an appointment for approximately two weeks after the date of your surgery. °Please pick up a stool softener and laxative for home use as long as you are requiring pain medications. °· ICE to the affected hip every three hours for 30 minutes at a time and then as needed for pain and swelling. Continue to use ice on the hip for pain and swelling from surgery. You may notice swelling that will progress down to the foot and ankle.  This is normal after surgery.  Elevate the leg when you are not up walking on it.   °It is important for you to complete the blood thinner medication as prescribed by your doctor. °· Continue to use the breathing machine which will help keep your temperature down.  It is common for your temperature to cycle up and down following surgery, especially at night when you are not up moving around and exerting yourself.  The breathing machine keeps your lungs expanded and your temperature down. ° °RANGE OF MOTION AND STRENGTHENING EXERCISES  °These exercises are   designed to help you keep full movement of your hip joint. Follow your caregiver's or physical therapist's instructions. Perform all exercises about fifteen times, three times per day or as directed. Exercise both hips, even if you have had only one joint replacement. These exercises can be done on a training (exercise) mat, on the floor, on a table or on a bed. Use whatever works the best and is most comfortable for you. Use music or television while you are exercising so that the exercises  are a pleasant break in your day. This will make your life better with the exercises acting as a break in routine you can look forward to.  °Lying on your back, slowly slide your foot toward your buttocks, raising your knee up off the floor. Then slowly slide your foot back down until your leg is straight again.  °Lying on your back spread your legs as far apart as you can without causing discomfort.  °Lying on your side, raise your upper leg and foot straight up from the floor as far as is comfortable. Slowly lower the leg and repeat.  °Lying on your back, tighten up the muscle in the front of your thigh (quadriceps muscles). You can do this by keeping your leg straight and trying to raise your heel off the floor. This helps strengthen the largest muscle supporting your knee.  °Lying on your back, tighten up the muscles of your buttocks both with the legs straight and with the knee bent at a comfortable angle while keeping your heel on the floor.  ° °SKILLED REHAB INSTRUCTIONS: °If the patient is transferred to a skilled rehab facility following release from the hospital, a list of the current medications will be sent to the facility for the patient to continue.  When discharged from the skilled rehab facility, please have the facility set up the patient's Home Health Physical Therapy prior to being released. Also, the skilled facility will be responsible for providing the patient with their medications at time of release from the facility to include their pain medication and their blood thinner medication. If the patient is still at the rehab facility at time of the two week follow up appointment, the skilled rehab facility will also need to assist the patient in arranging follow up appointment in our office and any transportation needs. ° °MAKE SURE YOU:  °Understand these instructions.  °Will watch your condition.  °Will get help right away if you are not doing well or get worse. ° °Pick up stool softner and  laxative for home use following surgery while on pain medications. °Do not remove your dressing. °The dressing is waterproof--it is OK to take showers. °Continue to use ice for pain and swelling after surgery. °Do not use any lotions or creams on the incision until instructed by your surgeon. °Total Hip Protocol. ° ° °

## 2016-09-12 NOTE — OR Nursing (Signed)
1100: in&out cath=550cc cyu, per protocol, no trauma.

## 2016-09-12 NOTE — Interval H&P Note (Signed)
History and Physical Interval Note:  09/12/2016 7:14 AM  Tanya [name]  has presented today for surgery, with the diagnosis of DJD RIGHT HIP  The various methods of treatment have been discussed with the patient and family. After consideration of risks, benefits and other options for treatment, the patient has consented to  Procedure(s): RIGHT TOTAL HIP ARTHROPLASTY ANTERIOR APPROACH (Right) as a surgical intervention .  The patient's history has been reviewed, patient examined, no change in status, stable for surgery.  I have reviewed the patient's chart and labs.  Questions were answered to the patient's satisfaction.     Davonne Jarnigan, Horald Pollen

## 2016-09-12 NOTE — Anesthesia Postprocedure Evaluation (Signed)
Anesthesia Post Note  Patient: Tanya [name]  Procedure(s) Performed: Procedure(s) (LRB): RIGHT TOTAL HIP ARTHROPLASTY ANTERIOR APPROACH (Right)  Patient location during evaluation: PACU Anesthesia Type: Spinal Level of consciousness: oriented and awake and alert Pain management: pain level controlled Vital Signs Assessment: post-procedure vital signs reviewed and stable Respiratory status: spontaneous breathing, respiratory function stable and nonlabored ventilation Cardiovascular status: blood pressure returned to baseline and stable Postop Assessment: no headache and no backache Anesthetic complications: no    Last Vitals:  Vitals:   09/12/16 1215 09/12/16 1230  BP: 103/63 103/68  Pulse: 67 80  Resp: 10 10  Temp:      Last Pain:  Vitals:   09/12/16 1200  TempSrc:   PainSc: (P) Asleep                 Nilda Simmer

## 2016-09-12 NOTE — Progress Notes (Signed)
Patient has order for CPAP and no machines available at this time. Explained to patient and she was okay with it. Explained that as son as machine was available she would be set up. She stated she would sleep u=more upright tonight and no problem and she was probably being DC's tomorrow.  RN aware and will call if any issue arise.

## 2016-09-12 NOTE — Care Management Note (Signed)
Case Management Note  Patient Details  Name: Tanya [name] MRN: [id number] Date of Birth: [birth year]  Subjective/Objective:  60 yr old female s/p right total hip arthroplasty.                  Action/Plan: Case manager spoke with patient concerning [facility] and DME needs. Patient was preoperatively setup with [facility], no changes. She states she has a 3in1, will need a rolling walker. CM has ordered RW. Patient states she will have family support at discharge.    Expected Discharge Date:   09/14/16               Expected Discharge Plan:  [place]  In-House Referral:  NA  Discharge planning Services  CM Consult  Post Acute Care Choice:  Durable Medical Equipment, Home Health Choice offered to:  Patient  DME Arranged:  Walker rolling DME Agency:  [place]:  PT [place]:  [facility] (now [facility])  Status of Service:  In process, will continue to follow  If discussed at Long Length of Stay Meetings, dates discussed:    Additional Comments:  Ninfa Meeker, RN 09/12/2016, 4:30 PM

## 2016-09-12 NOTE — Op Note (Signed)
OPERATIVE REPORT  SURGEON: Rod Can, MD   ASSISTANT: April Green, RNFA.  PREOPERATIVE DIAGNOSIS: Right hip arthritis.   POSTOPERATIVE DIAGNOSIS: Right hip arthritis.   PROCEDURE: Right total hip arthroplasty, anterior approach.   IMPLANTS: Biomet micro-plasty taper lock stem, size 12, high offset. Biomet G7 acetabular shell, size 50 mm. E1 antioxidant liner, 36 mm size D, neutral. Biomet ceramic head ball, size 36 -3 mm. 6.5 mm cancellus bone screws 2.  ANESTHESIA:  Spinal  ESTIMATED BLOOD LOSS: 400 mL.    ANTIBIOTICS: 900 mg clindamycin due to penicillin allergy. 1 g vancomycin.  DRAINS: None.  COMPLICATIONS: None.   CONDITION: PACU - hemodynamically stable.  BRIEF CLINICAL NOTE: Tanya [name] is a 60 y.o. female with a long-standing history of Right hip arthritis. After failing conservative management, the patient was indicated for total hip arthroplasty. The risks, benefits, and alternatives to the procedure were explained, and the patient elected to proceed.  PROCEDURE IN DETAIL: Surgical site was marked by myself. Spinal anesthesia was obtained in the pre-op holding area. Once inside the operative room, a foley catheter was inserted. The patient was then positioned on the Hana table. All bony prominences were well padded. The hip was prepped and draped in the normal sterile surgical fashion. A time-out was called verifying side and site of surgery. The patient received IV antibiotics within 60 minutes of beginning the procedure.  The direct anterior approach to the hip was performed through the Hueter interval. Lateral femoral circumflex vessels were treated with the Auqumantys. The anterior capsule was exposed and an inverted T capsulotomy was made.The femoral neck cut was made to the level of the templated cut. A corkscrew was placed into the head and the head was removed. The femoral head was found to have eburnated bone. The head was passed to the  back table and was measured.  Acetabular exposure was achieved, and the pulvinar and labrum were excised. Sequental reaming of the acetabulum was then performed up to a size 49 mm reamer. A 50 mm cup was then opened and impacted into place at approximately 40 degrees of abduction and 20 degrees of anteversion. I chose to augment the already acceptable press-fit fixation with 6.5 mm cancellous bone screws 2. The final polyethylene liner was impacted into place and acetabular osteophytes were removed.   I then gained femoral exposure taking care to protect the abductors and greater trochanter. This was performed using standard external rotation, extension, and adduction. The capsule was peeled off the inner aspect of the greater trochanter, taking care to preserve the short external rotators. A cookie cutter was used to enter the femoral canal, and then the femoral canal finder was placed. Sequential broaching was performed up to a size 13. Calcar planer was used on the femoral neck remnant. I placed a high offset neck and a trial head ball. The hip was reduced. Leg lengths and offset were checked fluoroscopically. The hip was dislocated and trial components were removed. The final implants were placed, and the hip was reduced.  Fluoroscopy was used to confirm component position and leg lengths. At 90 degrees of external rotation and full extension, the hip was stable to an anterior directed force.  The wound was copiously irrigated with a dilute betadine solution followed by normal saline. Marcaine solution was injected into the periarticular soft tissue. The wound was closed in layers using #1 Vicryl and V-Loc for the fascia, 2-0 Vicryl for the subcutaneous fat, 2-0 Monocryl for the deep dermal layer,  3-0 running Monocryl subcuticular stitch, and Dermabond for the skin. Once the glue was fully dried, an Aquacell Ag dressing was applied. The patient was transported to the recovery room in  stable condition. Sponge, needle, and instrument counts were correct at the end of the case x2. The patient tolerated the procedure well and there were no known complications.

## 2016-09-12 NOTE — Transfer of Care (Signed)
Immediate Anesthesia Transfer of Care Note  Patient: Tanya [name]  Procedure(s) Performed: Procedure(s): RIGHT TOTAL HIP ARTHROPLASTY ANTERIOR APPROACH (Right)  Patient Location: PACU  Anesthesia Type:MAC and Spinal  Level of Consciousness: sedated and patient cooperative  Airway & Oxygen Therapy: Patient Spontanous Breathing and Patient connected to face mask oxygen  Post-op Assessment: Report given to RN and Post -op Vital signs reviewed and stable  Post vital signs: Reviewed  Last Vitals:  Vitals:   09/12/16 0645  BP: 138/69  Pulse: 73  Resp: 20  Temp: 36.6 C    Last Pain:  Vitals:   09/12/16 0645  TempSrc: Oral  PainSc:       Patients Stated Pain Goal: 3 ([id number])  Complications: No apparent anesthesia complications

## 2016-09-12 NOTE — Discharge Summary (Signed)
Physician Discharge Summary  Patient ID: Tanya [name] MRN: [id number] DOB/AGE: 1956/07/04 60 y.o.  Admit date: 09/12/2016 Discharge date: 09/13/2016  Admission Diagnoses:  Primary osteoarthritis of right hip  Discharge Diagnoses:  Principal Problem:   Primary osteoarthritis of right hip Active Problems:   Osteoarthritis of right hip   Past Medical History:  Diagnosis Date  . Allergic rhinitis   . Blepharospasm 10/09/2010  . Cancer (HCC)    Skin  . Hip pain   . History of melanoma excision    MULTIPLE SITES  . Hypertension   . Mild intermittent asthma   . OA (osteoarthritis)   . PMB (postmenopausal bleeding)   . PONV (postoperative nausea and vomiting)    did not experience after D&C, request to use same medicaitons from that procedure  . Precancerous changes of the cervix   . Sleep apnea    tested in home around 2 yrs ago  . Varicose vein    wears compression stocking    Surgeries: Procedure(s): RIGHT TOTAL HIP ARTHROPLASTY ANTERIOR APPROACH on 09/12/2016   Consultants (if any):   Discharged Condition: Improved  Hospital Course: Tanya [name] is an 60 y.o. female who was admitted 09/12/2016 with a diagnosis of Primary osteoarthritis of right hip and went to the operating room on 09/12/2016 and underwent the above named procedures.    She was given perioperative antibiotics:  Anti-infectives    Start     Dose/Rate Route Frequency Ordered Stop   09/12/16 1930  vancomycin (VANCOCIN) IVPB 1000 mg/200 mL premix     1,000 mg 200 mL/hr over 60 Minutes Intravenous Every 12 hours 09/12/16 1505 09/12/16 2245   09/12/16 0700  clindamycin (CLEOCIN) IVPB 900 mg     900 mg 100 mL/hr over 30 Minutes Intravenous To ShortStay Surgical 09/11/16 0920 09/12/16 0731   09/12/16 0600  vancomycin (VANCOCIN) 1,500 mg in sodium chloride 0.9 % 500 mL IVPB     1,500 mg 250 mL/hr over 120 Minutes Intravenous To ShortStay Surgical 09/11/16 0920 09/12/16 0934    .  She was given  sequential compression devices, early ambulation, and ASA for DVT prophylaxis.  She benefited maximally from the hospital stay and there were no complications.    Recent vital signs:  Vitals:   09/13/16 0505 09/13/16 0952  BP: 105/61 114/61  Pulse: 77 91  Resp: 17   Temp: 97.7 F (36.5 C)     Recent laboratory studies:  Lab Results  Component Value Date   HGB 10.0 (L) 09/13/2016   HGB 14.4 09/01/2016   HGB 14.9 05/21/2016   Lab Results  Component Value Date   WBC 14.5 (H) 09/13/2016   PLT 195 09/13/2016   Lab Results  Component Value Date   INR 0.97 12/23/2013   Lab Results  Component Value Date   NA 137 09/13/2016   K 4.0 09/13/2016   CL 107 09/13/2016   CO2 25 09/13/2016   BUN 12 09/13/2016   CREATININE 0.61 09/13/2016   GLUCOSE 174 (H) 09/13/2016    Discharge Medications:     Medication List    STOP taking these medications   traMADol 200 MG 24 hr tablet Commonly known as:  ULTRAM-ER     TAKE these medications   albuterol 108 (90 Base) MCG/ACT inhaler Commonly known as:  PROVENTIL HFA;VENTOLIN HFA Inhale 2 puffs into the lungs every 6 (six) hours as needed for wheezing or shortness of breath.   amLODipine 10 MG tablet Commonly known as:  NORVASC TAKE 1 TABLET (10 MG TOTAL) BY MOUTH EVERY MORNING.   aspirin 81 MG tablet Take 1 tablet (81 mg total) by mouth 2 (two) times daily after a meal. What changed:  when to take this   calcium acetate 667 MG capsule Commonly known as:  PHOSLO TAKE 1 CAPSULE (667 MG TOTAL) BY MOUTH AT BEDTIME.   celecoxib 200 MG capsule Commonly known as:  CELEBREX TAKE 1 CAPSULE (200 MG TOTAL) BY MOUTH 2 (TWO) TIMES DAILY.   cetirizine 10 MG tablet Commonly known as:  ZYRTEC TAKE 1 TABLET (10 MG TOTAL) BY MOUTH DAILY. What changed:  how much to take  how to take this  when to take this  additional instructions   docusate sodium 100 MG capsule Commonly known as:  COLACE Take 1 capsule (100 mg total) by mouth  2 (two) times daily.   GLUCOSAMINE 1500 COMPLEX PO Take 1 tablet by mouth 2 (two) times daily.   HYDROcodone-acetaminophen 5-325 MG tablet Commonly known as:  NORCO Take 1-2 tablets by mouth every 4 (four) hours as needed for moderate pain.   montelukast 10 MG tablet Commonly known as:  SINGULAIR TAKE 1 TABLET (10 MG TOTAL) BY MOUTH EVERY MORNING.   multivitamin with minerals Tabs tablet Take 1 tablet by mouth daily.   ondansetron 4 MG tablet Commonly known as:  ZOFRAN Take 1 tablet (4 mg total) by mouth every 8 (eight) hours as needed for nausea or vomiting.   senna 8.6 MG Tabs tablet Commonly known as:  SENOKOT Take 2 tablets (17.2 mg total) by mouth at bedtime.   valsartan 160 MG tablet Commonly known as:  DIOVAN Take 1 tablet (160 mg total) by mouth every morning. What changed:  when to take this       Diagnostic Studies: Dg Pelvis Portable  Result Date: 09/12/2016 CLINICAL DATA:  Post right total hip arthroplasty. EXAM: PORTABLE PELVIS 1-2 VIEWS COMPARISON:  09/20/2016 FINDINGS: Single view of the pelvis demonstrates a right total hip arthroplasty. The entire femoral stem is visualized. Negative for a periprosthetic fracture. The hip appears located on this single view. No gross abnormality to the left hip joint. Visualized pelvic ring is intact. Multiple phleboliths in the pelvis. IMPRESSION: Right hip arthroplasty without complicating features. Electronically Signed   By: Markus Daft M.D.   On: 09/12/2016 13:37   Dg C-arm Gt 120 Min  Result Date: 09/12/2016 CLINICAL DATA:  Surgery: Right AA Hip  Fluoro Time: 46 Sec EXAM: OPERATIVE RIGHT HIP (WITH PELVIS IF PERFORMED) 2 VIEWS TECHNIQUE: Fluoroscopic spot image(s) were submitted for interpretation post-operatively. COMPARISON:  None. FINDINGS: Two intraoperative fluoroscopic images right hip arthroplasty hardware to be appropriately positioned. Surrounding osseous structures appear intact and anatomic in alignment. 46  seconds of fluoroscopy was provided for the procedure. IMPRESSION: Intraoperative fluoroscopic images showing right hip arthroplasty hardware. No evidence of surgical complicating feature. Electronically Signed   By: Franki Cabot M.D.   On: 09/12/2016 10:33   Dg Hip Operative Unilat W Or W/o Pelvis Right  Result Date: 09/12/2016 CLINICAL DATA:  Surgery: Right AA Hip  Fluoro Time: 46 Sec EXAM: OPERATIVE RIGHT HIP (WITH PELVIS IF PERFORMED) 2 VIEWS TECHNIQUE: Fluoroscopic spot image(s) were submitted for interpretation post-operatively. COMPARISON:  None. FINDINGS: Two intraoperative fluoroscopic images right hip arthroplasty hardware to be appropriately positioned. Surrounding osseous structures appear intact and anatomic in alignment. 46 seconds of fluoroscopy was provided for the procedure. IMPRESSION: Intraoperative fluoroscopic images showing right hip arthroplasty hardware. No evidence  of surgical complicating feature. Electronically Signed   By: Franki Cabot M.D.   On: 09/12/2016 10:33    Disposition: 01-Home or Self Care    Follow-up Information    Iram Astorino, Horald Pollen, MD. Schedule an appointment as soon as possible for a visit in 2 week(s).   Specialty:  Orthopedic Surgery Why:  For wound re-check Contact information: [address] [place] [address] [phone]        [facility] .   Why:  Someone from [facility] will contact you to arrange start date and time for therapy. Contact information: [place] [address] [phone]            Signed: Elie Goody 09/14/2016, 10:15 AM

## 2016-09-13 LAB — CBC
HCT: 30 % — ABNORMAL LOW (ref 36.0–46.0)
HEMOGLOBIN: 10 g/dL — AB (ref 12.0–15.0)
MCH: 29.4 pg (ref 26.0–34.0)
MCHC: 33.3 g/dL (ref 30.0–36.0)
MCV: 88.2 fL (ref 78.0–100.0)
PLATELETS: 195 10*3/uL (ref 150–400)
RBC: 3.4 MIL/uL — AB (ref 3.87–5.11)
RDW: 13.4 % (ref 11.5–15.5)
WBC: 14.5 10*3/uL — ABNORMAL HIGH (ref 4.0–10.5)

## 2016-09-13 LAB — BASIC METABOLIC PANEL
Anion gap: 5 (ref 5–15)
BUN: 12 mg/dL (ref 6–20)
CHLORIDE: 107 mmol/L (ref 101–111)
CO2: 25 mmol/L (ref 22–32)
CREATININE: 0.61 mg/dL (ref 0.44–1.00)
Calcium: 8.3 mg/dL — ABNORMAL LOW (ref 8.9–10.3)
Glucose, Bld: 174 mg/dL — ABNORMAL HIGH (ref 65–99)
POTASSIUM: 4 mmol/L (ref 3.5–5.1)
SODIUM: 137 mmol/L (ref 135–145)

## 2016-09-13 NOTE — Progress Notes (Signed)
OT Cancellation Note  Patient Details Name: Tanya [name] MRN: [id number] DOB: May 28, 1956   Cancelled Treatment:    Reason Eval/Treat Not Completed: OT screened, no needs identified, will sign off. Pt with previous hx of orthopedic sx. Educated in safe footwear, availability of AE for LB ADL and multiple uses of 3 in 1. Pt with no further questions or concerns.  Malka So 09/13/2016, 10:51 AM  [phone]

## 2016-09-13 NOTE — Evaluation (Signed)
Physical Therapy Evaluation Patient Details Name: Tanya [name] MRN: [id number] DOB: 12-Jun-1956 Today's Date: 09/13/2016   History of Present Illness  Pt is a 60 y/o female s/p R THA (anterior approach). PMH including but not limited to L TKA in 2015 and HTN.  Clinical Impression  Pt presented sitting OOB in recliner when PT entered room. Prior to admission, pt was mod I with use of SPC for functional mobility. Pt moving very well during evaluation and successfully completed stair training during session. Pt would continue to benefit from skilled physical therapy services at this time while admitted and after d/c to address her below listed limitations in order to improve her overall safety and independence with functional mobility.     Follow Up Recommendations Home health PT;Supervision for mobility/OOB    Equipment Recommendations  Rolling walker with 5" wheels    Recommendations for Other Services       Precautions / Restrictions Precautions Precautions: Fall Restrictions Weight Bearing Restrictions: Yes RLE Weight Bearing: Weight bearing as tolerated      Mobility  Bed Mobility               General bed mobility comments: pt sitting OOB in recliner when PT entered room  Transfers Overall transfer level: Needs assistance Equipment used: Rolling walker (2 wheeled) Transfers: Sit to/from Stand Sit to Stand: Supervision         General transfer comment: pt required increased time, good hand placement  Ambulation/Gait Ambulation/Gait assistance: Supervision Ambulation Distance (Feet): 150 Feet Assistive device: Rolling walker (2 wheeled) Gait Pattern/deviations: Step-through pattern;Decreased step length - left;Decreased stance time - right;Decreased weight shift to right Gait velocity: decreased Gait velocity interpretation: Below normal speed for age/gender General Gait Details: pt demonstrated good, safe technique with RW  Stairs Stairs: Yes Stairs  assistance: Min guard Stair Management: One rail Right;Step to pattern;Sideways Number of Stairs: 12 General stair comments: pt ascended with L LE leading and descended with R LE leading  Wheelchair Mobility    Modified Rankin (Stroke Patients Only)       Balance Overall balance assessment: Needs assistance Sitting-balance support: Feet supported;No upper extremity supported Sitting balance-Leahy Scale: Good     Standing balance support: During functional activity;Bilateral upper extremity supported Standing balance-Leahy Scale: Poor Standing balance comment: pt reliant on bilateral UEs on RW                             Pertinent Vitals/Pain Pain Assessment: 0-10 Pain Score: 4  Pain Location: R hip, incision site Pain Descriptors / Indicators: Operative site guarding;Sore Pain Intervention(s): Monitored during session;Repositioned    Home Living Family/patient expects to be discharged to:: Private residence Living Arrangements: Spouse/significant other Available Help at Discharge: Family;Available 24 hours/day Type of Home: House Home Access: Stairs to enter Entrance Stairs-Rails: Psychiatric nurse of Steps: 8 Home Layout: Two level;Able to live on main level with bedroom/bathroom Home Equipment: Kasandra Knudsen - single point;Bedside commode      Prior Function Level of Independence: Independent with assistive device(s)         Comments: prior to admission, pt reported using SPC to ambulate with secondary to pain     Hand Dominance        Extremity/Trunk Assessment   Upper Extremity Assessment: Overall WFL for tasks assessed           Lower Extremity Assessment: RLE deficits/detail RLE Deficits / Details: Pt with decreased strength and  ROM limitations secondary to post-op.    Cervical / Trunk Assessment: Normal  Communication   Communication: No difficulties  Cognition Arousal/Alertness: Awake/alert Behavior During Therapy: WFL  for tasks assessed/performed Overall Cognitive Status: Within Functional Limits for tasks assessed                      General Comments      Exercises     Assessment/Plan    PT Assessment Patient needs continued PT services  PT Problem List Decreased strength;Decreased range of motion;Decreased activity tolerance;Decreased balance;Decreased mobility;Decreased coordination;Decreased knowledge of use of DME;Pain          PT Treatment Interventions DME instruction;Gait training;Stair training;Therapeutic activities;Therapeutic exercise;Functional mobility training;Balance training;Neuromuscular re-education;Patient/family education    PT Goals (Current goals can be found in the Care Plan section)  Acute Rehab PT Goals Patient Stated Goal: return home today PT Goal Formulation: With patient Time For Goal Achievement: 09/20/16 Potential to Achieve Goals: Good    Frequency 7X/week   Barriers to discharge        Co-evaluation               End of Session Equipment Utilized During Treatment: Gait belt Activity Tolerance: Patient tolerated treatment well Patient left: in chair;with call bell/phone within reach Nurse Communication: Mobility status         Time: [id number] PT Time Calculation (min) (ACUTE ONLY): 25 min   Charges:   PT Evaluation $PT Eval Moderate Complexity: 1 Procedure PT Treatments $Gait Training: 8-22 mins   PT G CodesClearnce Sorrel Orestes Geiman 09/13/2016, 10:35 AM Sherie Don, PT, DPT [phone]

## 2016-09-13 NOTE — Progress Notes (Signed)
Subjective: 1 Day Post-Op Procedure(s) (LRB): RIGHT TOTAL HIP ARTHROPLASTY ANTERIOR APPROACH (Right) Patient reports pain as 2 on 0-10 scale.  Doing very well.Dressing dry. Will DC.  Objective: Vital signs in last 24 hours: Temp:  [97.5 F (36.4 C)-98.4 F (36.9 C)] 97.7 F (36.5 C) (10/21 0505) Pulse Rate:  [67-87] 77 (10/21 0505) Resp:  [10-24] 17 (10/21 0505) BP: (86-112)/(44-79) 105/61 (10/21 0505) SpO2:  [93 %-100 %] 97 % (10/21 0505)  Intake/Output from previous day: 10/20 0701 - 10/21 0700 In: [id number] [P.O.:420; I.V.:4000] Out: 850 [Urine:450; Blood:400] Intake/Output this shift: No intake/output data recorded.   Recent Labs  09/13/16 0405  HGB 10.0*    Recent Labs  09/13/16 0405  WBC 14.5*  RBC 3.40*  HCT 30.0*  PLT 195    Recent Labs  09/13/16 0405  NA 137  K 4.0  CL 107  CO2 25  BUN 12  CREATININE 0.61  GLUCOSE 174*  CALCIUM 8.3*   No results for input(s): LABPT, INR in the last 72 hours.  No cellulitis present Compartment soft  Assessment/Plan: 1 Day Post-Op Procedure(s) (LRB): RIGHT TOTAL HIP ARTHROPLASTY ANTERIOR APPROACH (Right) Up with therapy Discharge home with home health  Tanya [name] A 09/13/2016, 8:07 AM

## 2016-09-13 NOTE — Progress Notes (Signed)
Prescriptions and discharge instructions provided.  Patient escorted via wheelchair at discharge.

## 2016-09-15 ENCOUNTER — Encounter (HOSPITAL_COMMUNITY): Payer: Self-pay | Admitting: Orthopedic Surgery

## 2016-10-01 ENCOUNTER — Inpatient Hospital Stay (HOSPITAL_COMMUNITY): Payer: Managed Care, Other (non HMO) | Admitting: Certified Registered Nurse Anesthetist

## 2016-10-01 ENCOUNTER — Inpatient Hospital Stay (HOSPITAL_COMMUNITY)
Admission: AD | Admit: 2016-10-01 | Discharge: 2016-10-05 | DRG: 858 | Disposition: A | Discharge status: Home / Self Care | Payer: Managed Care, Other (non HMO) | Source: Ambulatory Visit | Attending: Orthopedic Surgery | Admitting: Orthopedic Surgery

## 2016-10-01 ENCOUNTER — Inpatient Hospital Stay (HOSPITAL_COMMUNITY): Payer: Managed Care, Other (non HMO)

## 2016-10-01 ENCOUNTER — Encounter (HOSPITAL_COMMUNITY)
Admission: AD | Disposition: A | Discharge status: Home / Self Care | Payer: Self-pay | Source: Ambulatory Visit | Attending: Orthopedic Surgery

## 2016-10-01 ENCOUNTER — Encounter (HOSPITAL_COMMUNITY): Payer: Self-pay | Admitting: Surgery

## 2016-10-01 DIAGNOSIS — K219 Gastro-esophageal reflux disease without esophagitis: Secondary | ICD-10-CM | POA: Diagnosis present

## 2016-10-01 DIAGNOSIS — J4520 Mild intermittent asthma, uncomplicated: Secondary | ICD-10-CM | POA: Diagnosis present

## 2016-10-01 DIAGNOSIS — Z79899 Other long term (current) drug therapy: Secondary | ICD-10-CM

## 2016-10-01 DIAGNOSIS — I10 Essential (primary) hypertension: Secondary | ICD-10-CM | POA: Diagnosis present

## 2016-10-01 DIAGNOSIS — T8149XA Infection following a procedure, other surgical site, initial encounter: Secondary | ICD-10-CM | POA: Diagnosis present

## 2016-10-01 DIAGNOSIS — Z888 Allergy status to other drugs, medicaments and biological substances status: Secondary | ICD-10-CM

## 2016-10-01 DIAGNOSIS — Z96641 Presence of right artificial hip joint: Secondary | ICD-10-CM | POA: Diagnosis present

## 2016-10-01 DIAGNOSIS — G4730 Sleep apnea, unspecified: Secondary | ICD-10-CM | POA: Diagnosis present

## 2016-10-01 DIAGNOSIS — Z800 Family history of malignant neoplasm of digestive organs: Secondary | ICD-10-CM | POA: Diagnosis not present

## 2016-10-01 DIAGNOSIS — Z880 Allergy status to penicillin: Secondary | ICD-10-CM

## 2016-10-01 DIAGNOSIS — Z85820 Personal history of malignant melanoma of skin: Secondary | ICD-10-CM | POA: Diagnosis not present

## 2016-10-01 DIAGNOSIS — Y838 Other surgical procedures as the cause of abnormal reaction of the patient, or of later complication, without mention of misadventure at the time of the procedure: Secondary | ICD-10-CM | POA: Diagnosis present

## 2016-10-01 DIAGNOSIS — T814XXA Infection following a procedure, initial encounter: Principal | ICD-10-CM | POA: Diagnosis present

## 2016-10-01 DIAGNOSIS — Z825 Family history of asthma and other chronic lower respiratory diseases: Secondary | ICD-10-CM

## 2016-10-01 DIAGNOSIS — Z7982 Long term (current) use of aspirin: Secondary | ICD-10-CM

## 2016-10-01 DIAGNOSIS — Z96652 Presence of left artificial knee joint: Secondary | ICD-10-CM | POA: Diagnosis present

## 2016-10-01 DIAGNOSIS — Z01818 Encounter for other preprocedural examination: Secondary | ICD-10-CM

## 2016-10-01 HISTORY — PX: TOTAL HIP ARTHROPLASTY: SHX124

## 2016-10-01 LAB — BASIC METABOLIC PANEL
Anion gap: 7 (ref 5–15)
BUN: 17 mg/dL (ref 6–20)
CALCIUM: 9.2 mg/dL (ref 8.9–10.3)
CHLORIDE: 110 mmol/L (ref 101–111)
CO2: 23 mmol/L (ref 22–32)
CREATININE: 0.58 mg/dL (ref 0.44–1.00)
GFR calc Af Amer: >60 mL/min (ref >60)
GFR calc non Af Amer: >60 mL/min (ref >60)
GLUCOSE: 107 mg/dL — AB (ref 65–99)
Potassium: 4.8 mmol/L (ref 3.5–5.1)
Sodium: 140 mmol/L (ref 135–145)

## 2016-10-01 LAB — TYPE AND SCREEN
ABO/RH(D): O POS
Antibody Screen: NEGATIVE

## 2016-10-01 LAB — C-REACTIVE PROTEIN

## 2016-10-01 LAB — CBC
HEMATOCRIT: 34.4 % — AB (ref 36.0–46.0)
HEMOGLOBIN: 10.8 g/dL — AB (ref 12.0–15.0)
MCH: 28.9 pg (ref 26.0–34.0)
MCHC: 31.4 g/dL (ref 30.0–36.0)
MCV: 92 fL (ref 78.0–100.0)
Platelets: 290 10*3/uL (ref 150–400)
RBC: 3.74 MIL/uL — ABNORMAL LOW (ref 3.87–5.11)
RDW: 14.6 % (ref 11.5–15.5)
WBC: 6.1 10*3/uL (ref 4.0–10.5)

## 2016-10-01 LAB — MRSA PCR SCREENING: MRSA by PCR: NEGATIVE

## 2016-10-01 LAB — SEDIMENTATION RATE: Sed Rate: 30 mm/hr — ABNORMAL HIGH (ref 0–22)

## 2016-10-01 SURGERY — ARTHROPLASTY, HIP, TOTAL, ANTERIOR APPROACH
Anesthesia: General | Laterality: Right

## 2016-10-01 MED ORDER — DEXAMETHASONE SODIUM PHOSPHATE 10 MG/ML IJ SOLN
INTRAMUSCULAR | Status: AC
  Filled 2016-10-01: qty 1

## 2016-10-01 MED ORDER — LIDOCAINE HCL (CARDIAC) 20 MG/ML IV SOLN
INTRAVENOUS | Status: DC | PRN
  Administered 2016-10-01: 80 mg via INTRAVENOUS

## 2016-10-01 MED ORDER — METOCLOPRAMIDE HCL 5 MG/ML IJ SOLN
5.0000 mg | Freq: Three times a day (TID) | INTRAMUSCULAR | Status: DC | PRN
Start: 2016-10-01 — End: 2016-10-05

## 2016-10-01 MED ORDER — METOCLOPRAMIDE HCL 5 MG/ML IJ SOLN
INTRAMUSCULAR | Status: AC
Start: 2016-10-01 — End: 2016-10-01
  Filled 2016-10-01: qty 2

## 2016-10-01 MED ORDER — DIPHENHYDRAMINE HCL 50 MG/ML IJ SOLN
INTRAMUSCULAR | Status: DC | PRN
  Administered 2016-10-01: 12.5 mg via INTRAVENOUS

## 2016-10-01 MED ORDER — MIDAZOLAM HCL 5 MG/5ML IJ SOLN
INTRAMUSCULAR | Status: DC | PRN
  Administered 2016-10-01: 2 mg via INTRAVENOUS

## 2016-10-01 MED ORDER — SCOPOLAMINE 1 MG/3DAYS TD PT72
MEDICATED_PATCH | TRANSDERMAL | Status: AC
  Filled 2016-10-01: qty 1

## 2016-10-01 MED ORDER — HYDROMORPHONE HCL 1 MG/ML IJ SOLN
INTRAMUSCULAR | Status: AC
  Filled 2016-10-01: qty 0.5

## 2016-10-01 MED ORDER — ACETAMINOPHEN 650 MG RE SUPP: 650.0000 mg | Freq: Four times a day (QID) | RECTAL | Status: DC | PRN

## 2016-10-01 MED ORDER — HYDROCODONE-ACETAMINOPHEN 5-325 MG PO TABS
1.0000 | ORAL_TABLET | ORAL | Status: DC | PRN
  Administered 2016-10-01: 2 via ORAL
  Administered 2016-10-02: 1 via ORAL
  Filled 2016-10-01 (×3): qty 2
  Filled 2016-10-01: qty 1

## 2016-10-01 MED ORDER — OXYCODONE HCL 5 MG PO TABS: 5.0000 mg | ORAL_TABLET | Freq: Once | ORAL | Status: DC | PRN

## 2016-10-01 MED ORDER — LACTATED RINGERS IV SOLN
INTRAVENOUS | Status: DC | PRN
  Administered 2016-10-01: 18:00:00 via INTRAVENOUS

## 2016-10-01 MED ORDER — MIDAZOLAM HCL 2 MG/2ML IJ SOLN
INTRAMUSCULAR | Status: AC
  Filled 2016-10-01: qty 2

## 2016-10-01 MED ORDER — SODIUM CHLORIDE 0.9 % IR SOLN
Status: DC | PRN
  Administered 2016-10-01: 3000 mL

## 2016-10-01 MED ORDER — IRBESARTAN 150 MG PO TABS
150.0000 mg | ORAL_TABLET | Freq: Every day | ORAL | Status: DC
  Administered 2016-10-02 – 2016-10-05 (×3): 150 mg via ORAL
  Filled 2016-10-01 (×4): qty 1

## 2016-10-01 MED ORDER — POLYETHYLENE GLYCOL 3350 17 G PO PACK: 17.0000 g | PACK | Freq: Every day | ORAL | Status: DC | PRN

## 2016-10-01 MED ORDER — MONTELUKAST SODIUM 10 MG PO TABS
10.0000 mg | ORAL_TABLET | Freq: Every day | ORAL | Status: DC
  Administered 2016-10-02 – 2016-10-04 (×3): 10 mg via ORAL
  Filled 2016-10-01 (×3): qty 1

## 2016-10-01 MED ORDER — ADULT MULTIVITAMIN W/MINERALS CH
1.0000 | ORAL_TABLET | Freq: Every day | ORAL | Status: DC
  Administered 2016-10-02 – 2016-10-05 (×4): 1 via ORAL
  Filled 2016-10-01 (×4): qty 1

## 2016-10-01 MED ORDER — VANCOMYCIN HCL IN DEXTROSE 1-5 GM/200ML-% IV SOLN
1000.0000 mg | Freq: Two times a day (BID) | INTRAVENOUS | Status: DC
  Administered 2016-10-02 – 2016-10-05 (×7): 1000 mg via INTRAVENOUS
  Filled 2016-10-01 (×8): qty 200

## 2016-10-01 MED ORDER — VANCOMYCIN HCL 10 G IV SOLR
1500.0000 mg | INTRAVENOUS | Status: AC
  Administered 2016-10-01: 1500 mg via INTRAVENOUS
  Filled 2016-10-01: qty 1500

## 2016-10-01 MED ORDER — ASPIRIN EC 81 MG PO TBEC
81.0000 mg | DELAYED_RELEASE_TABLET | Freq: Two times a day (BID) | ORAL | Status: DC
  Administered 2016-10-02 – 2016-10-05 (×7): 81 mg via ORAL
  Filled 2016-10-01 (×7): qty 1

## 2016-10-01 MED ORDER — ONDANSETRON HCL 4 MG/2ML IJ SOLN: 4.0000 mg | Freq: Four times a day (QID) | INTRAMUSCULAR | Status: DC | PRN

## 2016-10-01 MED ORDER — CALCIUM ACETATE (PHOS BINDER) 667 MG PO CAPS
667.0000 mg | ORAL_CAPSULE | Freq: Three times a day (TID) | ORAL | Status: DC
  Administered 2016-10-02 – 2016-10-04 (×4): 667 mg via ORAL
  Filled 2016-10-01 (×7): qty 1

## 2016-10-01 MED ORDER — LORATADINE 10 MG PO TABS
10.0000 mg | ORAL_TABLET | Freq: Every day | ORAL | Status: DC
  Administered 2016-10-02 – 2016-10-05 (×4): 10 mg via ORAL
  Filled 2016-10-01 (×4): qty 1

## 2016-10-01 MED ORDER — PROPOFOL 10 MG/ML IV BOLUS
INTRAVENOUS | Status: DC | PRN
Start: 2016-10-01 — End: 2016-10-01
  Administered 2016-10-01: 200 mg via INTRAVENOUS

## 2016-10-01 MED ORDER — METOCLOPRAMIDE HCL 5 MG PO TABS: 5.0000 mg | ORAL_TABLET | Freq: Three times a day (TID) | ORAL | Status: DC | PRN

## 2016-10-01 MED ORDER — FENTANYL CITRATE (PF) 100 MCG/2ML IJ SOLN
INTRAMUSCULAR | Status: DC | PRN
  Administered 2016-10-01: 150 ug via INTRAVENOUS
  Administered 2016-10-01: 100 ug via INTRAVENOUS
  Administered 2016-10-01: 50 ug via INTRAVENOUS

## 2016-10-01 MED ORDER — SUGAMMADEX SODIUM 200 MG/2ML IV SOLN
INTRAVENOUS | Status: AC
  Filled 2016-10-01: qty 2

## 2016-10-01 MED ORDER — MONTELUKAST SODIUM 10 MG PO TABS: 10.0000 mg | ORAL_TABLET | Freq: Every day | ORAL | Status: DC

## 2016-10-01 MED ORDER — ARTIFICIAL TEARS OP OINT
TOPICAL_OINTMENT | OPHTHALMIC | Status: DC | PRN
  Administered 2016-10-01: 1 via OPHTHALMIC

## 2016-10-01 MED ORDER — FENTANYL CITRATE (PF) 100 MCG/2ML IJ SOLN
INTRAMUSCULAR | Status: AC
  Filled 2016-10-01: qty 2

## 2016-10-01 MED ORDER — METHOCARBAMOL 500 MG PO TABS
500.0000 mg | ORAL_TABLET | Freq: Four times a day (QID) | ORAL | Status: DC | PRN
  Filled 2016-10-01: qty 1

## 2016-10-01 MED ORDER — OXYCODONE HCL 5 MG/5ML PO SOLN: 5.0000 mg | Freq: Once | ORAL | Status: DC | PRN

## 2016-10-01 MED ORDER — ONDANSETRON HCL 4 MG/2ML IJ SOLN
INTRAMUSCULAR | Status: DC | PRN
  Administered 2016-10-01: 4 mg via INTRAVENOUS

## 2016-10-01 MED ORDER — HYDROMORPHONE HCL 1 MG/ML IJ SOLN
0.2500 mg | INTRAMUSCULAR | Status: DC | PRN
  Administered 2016-10-01: 0.25 mg via INTRAVENOUS

## 2016-10-01 MED ORDER — SCOPOLAMINE 1 MG/3DAYS TD PT72
1.0000 | MEDICATED_PATCH | TRANSDERMAL | Status: DC
  Administered 2016-10-01: 1.5 mg via TRANSDERMAL
  Filled 2016-10-01: qty 1

## 2016-10-01 MED ORDER — LACTATED RINGERS IV SOLN
INTRAVENOUS | Status: DC
  Administered 2016-10-01: 17:00:00 via INTRAVENOUS

## 2016-10-01 MED ORDER — DIPHENHYDRAMINE HCL 50 MG/ML IJ SOLN
INTRAMUSCULAR | Status: AC
  Filled 2016-10-01: qty 1

## 2016-10-01 MED ORDER — ROCURONIUM BROMIDE 100 MG/10ML IV SOLN
INTRAVENOUS | Status: DC | PRN
  Administered 2016-10-01: 40 mg via INTRAVENOUS
  Administered 2016-10-01: 20 mg via INTRAVENOUS

## 2016-10-01 MED ORDER — ONDANSETRON HCL 4 MG PO TABS: 4.0000 mg | ORAL_TABLET | Freq: Four times a day (QID) | ORAL | Status: DC | PRN

## 2016-10-01 MED ORDER — SENNA 8.6 MG PO TABS
1.0000 | ORAL_TABLET | Freq: Two times a day (BID) | ORAL | Status: DC
  Administered 2016-10-01 – 2016-10-05 (×7): 8.6 mg via ORAL
  Filled 2016-10-01 (×8): qty 1

## 2016-10-01 MED ORDER — CHLORHEXIDINE GLUCONATE 4 % EX LIQD
60.0000 mL | Freq: Once | CUTANEOUS | Status: AC
  Administered 2016-10-01: 4 via TOPICAL

## 2016-10-01 MED ORDER — ACETAMINOPHEN 325 MG PO TABS: 650.0000 mg | ORAL_TABLET | Freq: Four times a day (QID) | ORAL | Status: DC | PRN

## 2016-10-01 MED ORDER — SODIUM CHLORIDE 0.9 % IV SOLN
INTRAVENOUS | Status: DC
  Administered 2016-10-01 – 2016-10-02 (×3): via INTRAVENOUS
  Administered 2016-10-03: 100 mL/h via INTRAVENOUS
  Administered 2016-10-03 – 2016-10-04 (×2): via INTRAVENOUS

## 2016-10-01 MED ORDER — 0.9 % SODIUM CHLORIDE (POUR BTL) OPTIME
TOPICAL | Status: DC | PRN
  Administered 2016-10-01: 1000 mL

## 2016-10-01 MED ORDER — ONDANSETRON HCL 4 MG/2ML IJ SOLN
INTRAMUSCULAR | Status: AC
  Filled 2016-10-01: qty 2

## 2016-10-01 MED ORDER — DEXTROSE 5 % IV SOLN
500.0000 mg | Freq: Four times a day (QID) | INTRAVENOUS | Status: DC | PRN
  Filled 2016-10-01: qty 5

## 2016-10-01 MED ORDER — METOCLOPRAMIDE HCL 5 MG/ML IJ SOLN
INTRAMUSCULAR | Status: DC | PRN
  Administered 2016-10-01: 10 mg via INTRAVENOUS

## 2016-10-01 MED ORDER — SUGAMMADEX SODIUM 200 MG/2ML IV SOLN
INTRAVENOUS | Status: DC | PRN
  Administered 2016-10-01: 200 mg via INTRAVENOUS

## 2016-10-01 MED ORDER — ALBUTEROL SULFATE (2.5 MG/3ML) 0.083% IN NEBU: 3.0000 mL | INHALATION_SOLUTION | Freq: Four times a day (QID) | RESPIRATORY_TRACT | Status: DC | PRN

## 2016-10-01 MED ORDER — DEXAMETHASONE SODIUM PHOSPHATE 10 MG/ML IJ SOLN
INTRAMUSCULAR | Status: DC | PRN
  Administered 2016-10-01: 10 mg via INTRAVENOUS

## 2016-10-01 MED ORDER — POVIDONE-IODINE 10 % EX SWAB: 2.0000 "application " | Freq: Once | CUTANEOUS | Status: DC

## 2016-10-01 MED ORDER — AMLODIPINE BESYLATE 10 MG PO TABS
10.0000 mg | ORAL_TABLET | Freq: Every day | ORAL | Status: DC
  Administered 2016-10-02 – 2016-10-05 (×3): 10 mg via ORAL
  Filled 2016-10-01 (×4): qty 1

## 2016-10-01 SURGICAL SUPPLY — 55 items
ALCOHOL ISOPROPYL (RUBBING) (MISCELLANEOUS) ×3 IMPLANT
BLADE SURG ROTATE 9660 (MISCELLANEOUS) IMPLANT
CANISTER WOUND CARE 500ML ATS (WOUND CARE) ×3 IMPLANT
CHLORAPREP W/TINT 26ML (MISCELLANEOUS) ×3 IMPLANT
COVER SURGICAL LIGHT HANDLE (MISCELLANEOUS) ×3 IMPLANT
DERMABOND ADVANCED (GAUZE/BANDAGES/DRESSINGS) ×2
DERMABOND ADVANCED .7 DNX12 (GAUZE/BANDAGES/DRESSINGS) ×1 IMPLANT
DRAPE C-ARM 42X72 X-RAY (DRAPES) IMPLANT
DRAPE IMP U-DRAPE 54X76 (DRAPES) ×6 IMPLANT
DRAPE STERI IOBAN 125X83 (DRAPES) ×3 IMPLANT
DRAPE U-SHAPE 47X51 STRL (DRAPES) ×9 IMPLANT
DRSG AQUACEL AG ADV 3.5X10 (GAUZE/BANDAGES/DRESSINGS) IMPLANT
ELECT BLADE 4.0 EZ CLEAN MEGAD (MISCELLANEOUS) ×3
ELECT REM PT RETURN 9FT ADLT (ELECTROSURGICAL) ×3
ELECTRODE BLDE 4.0 EZ CLN MEGD (MISCELLANEOUS) ×1 IMPLANT
ELECTRODE REM PT RTRN 9FT ADLT (ELECTROSURGICAL) ×1 IMPLANT
EVACUATOR 1/8 PVC DRAIN (DRAIN) IMPLANT
GLOVE BIO SURGEON STRL SZ8.5 (GLOVE) ×6 IMPLANT
GLOVE BIOGEL PI IND STRL 8.5 (GLOVE) ×1 IMPLANT
GLOVE BIOGEL PI INDICATOR 8.5 (GLOVE) ×2
GOWN STRL REUS W/ TWL LRG LVL3 (GOWN DISPOSABLE) ×2 IMPLANT
GOWN STRL REUS W/TWL 2XL LVL3 (GOWN DISPOSABLE) ×3 IMPLANT
GOWN STRL REUS W/TWL LRG LVL3 (GOWN DISPOSABLE) ×4
HANDPIECE INTERPULSE COAX TIP (DISPOSABLE) ×2
HOOD PEEL AWAY FACE SHEILD DIS (HOOD) ×9 IMPLANT
KIT BASIN OR (CUSTOM PROCEDURE TRAY) ×3 IMPLANT
KIT PREVENA INCISION MGT 13 (CANNISTER) ×3 IMPLANT
KIT ROOM TURNOVER OR (KITS) ×3 IMPLANT
MANIFOLD NEPTUNE II (INSTRUMENTS) ×3 IMPLANT
MARKER SKIN DUAL TIP RULER LAB (MISCELLANEOUS) ×6 IMPLANT
NEEDLE SPNL 18GX3.5 QUINCKE PK (NEEDLE) ×3 IMPLANT
NS IRRIG 1000ML POUR BTL (IV SOLUTION) ×3 IMPLANT
PACK TOTAL JOINT (CUSTOM PROCEDURE TRAY) ×3 IMPLANT
PACK UNIVERSAL I (CUSTOM PROCEDURE TRAY) ×3 IMPLANT
PAD ARMBOARD 7.5X6 YLW CONV (MISCELLANEOUS) ×6 IMPLANT
SAW OSC TIP CART 19.5X105X1.3 (SAW) ×3 IMPLANT
SEALER BIPOLAR AQUA 6.0 (INSTRUMENTS) IMPLANT
SET HNDPC FAN SPRY TIP SCT (DISPOSABLE) ×1 IMPLANT
SOLUTION BETADINE 4OZ (MISCELLANEOUS) ×3 IMPLANT
SUCTION FRAZIER HANDLE 10FR (MISCELLANEOUS) ×2
SUCTION TUBE FRAZIER 10FR DISP (MISCELLANEOUS) ×1 IMPLANT
SUT ETHIBOND NAB CT1 #1 30IN (SUTURE) ×6 IMPLANT
SUT MNCRL AB 3-0 PS2 18 (SUTURE) ×3 IMPLANT
SUT MON AB 2-0 CT1 36 (SUTURE) ×3 IMPLANT
SUT VIC AB 1 CT1 27 (SUTURE) ×2
SUT VIC AB 1 CT1 27XBRD ANBCTR (SUTURE) ×1 IMPLANT
SUT VIC AB 2-0 CT1 27 (SUTURE) ×2
SUT VIC AB 2-0 CT1 TAPERPNT 27 (SUTURE) ×1 IMPLANT
SUT VLOC 180 0 24IN GS25 (SUTURE) ×3 IMPLANT
SYR 50ML LL SCALE MARK (SYRINGE) ×3 IMPLANT
TOWEL OR 17X24 6PK STRL BLUE (TOWEL DISPOSABLE) ×3 IMPLANT
TOWEL OR 17X26 10 PK STRL BLUE (TOWEL DISPOSABLE) ×3 IMPLANT
TRAY CATH 16FR W/PLASTIC CATH (SET/KITS/TRAYS/PACK) IMPLANT
TRAY FOLEY CATH 16FR SILVER (SET/KITS/TRAYS/PACK) IMPLANT
WATER STERILE IRR 1000ML POUR (IV SOLUTION) ×9 IMPLANT

## 2016-10-01 NOTE — Transfer of Care (Signed)
Immediate Anesthesia Transfer of Care Note  Patient: Tanya [name]  Procedure(s) Performed: Procedure(s): IRRIGATION AND DEBRIDEMENT RIGHT HIP (Right)  Patient Location: PACU  Anesthesia Type:General  Level of Consciousness: awake, oriented, sedated, patient cooperative and responds to stimulation  Airway & Oxygen Therapy: Patient Spontanous Breathing and Patient connected to nasal cannula oxygen  Post-op Assessment: Report given to RN, Post -op Vital signs reviewed and stable, Patient moving all extremities and Patient moving all extremities X 4  Post vital signs: Reviewed and stable  Last Vitals:  Vitals:   10/01/16 1913 10/01/16 1915  BP: (!) 98/51 (!) 98/51  Pulse: 73 74  Resp: 13 14  Temp: 36.6 C     Last Pain: There were no vitals filed for this visit.       Complications: No apparent anesthesia complications

## 2016-10-01 NOTE — Anesthesia Postprocedure Evaluation (Signed)
Anesthesia Post Note  Patient: Tanya [name]  Procedure(s) Performed: Procedure(s) (LRB): IRRIGATION AND DEBRIDEMENT RIGHT HIP (Right)  Patient location during evaluation: PACU Anesthesia Type: General Level of consciousness: awake Pain management: pain level controlled Vital Signs Assessment: post-procedure vital signs reviewed and stable Respiratory status: spontaneous breathing Cardiovascular status: stable Postop Assessment: no signs of nausea or vomiting Anesthetic complications: no    Last Vitals:  Vitals:   10/01/16 1954 10/01/16 2100  BP: (!) 106/44 108/68  Pulse: 73 75  Resp: 15 15  Temp: 36.6 C 36.7 C    Last Pain:  Vitals:   10/01/16 2100  TempSrc: Oral  PainSc:                  [name]

## 2016-10-01 NOTE — Progress Notes (Signed)
Pharmacy Antibiotic Note  60 y.o. female admitted on 10/01/2016 to start vancomycin for superficial surgical wound infection s/p irrigation and debridement of R hip. Received vancomycin 1500mg  x1 at ~1830. Currently afebrile, WBC WNL, SCr WNL. Will dose for obesity. Cultures pending.  Plan: -Vancomycin 1000mg  q12h -F/U cultures -Monitor renal fxn, clinical course, LOT  Height: 5\' 7"  (170.2 cm) Weight: 248 lb (112.5 kg) IBW/kg (Calculated) : 61.6  Temp (24hrs), Avg:98 F (36.7 C), Min:97.8 F (36.6 C), Max:98.1 F (36.7 C)   Recent Labs Lab 10/01/16 1203  WBC 6.1  CREATININE 0.58    Estimated Creatinine Clearance: 99.2 mL/min (by C-G formula based on SCr of 0.58 mg/dL).    Allergies  Allergen Reactions  . Ace Inhibitors Hives  . Amoxicillin Hives  . Other Hives    Nucafed  . Penicillins Hives    Has patient had a PCN reaction causing immediate rash, facial/tongue/throat swelling, SOB or lightheadedness with hypotension: Yes Has patient had a PCN reaction causing severe rash involving mucus membranes or skin necrosis: No Has patient had a PCN reaction that required hospitalization No Has patient had a PCN reaction occurring within the last 10 years: No If all of the above answers are "NO", then may proceed with Cephalosporin use.   . Pseudoephedrine Hives  . Pseudoephedrine-Codeine Hives    Antimicrobials this admission:   Dose adjustments this admission:   Microbiology results:  Thank you for allowing pharmacy to be a part of this patient's care.  Stephens November, PharmD Clinical Pharmacist 8:22 PM, 10/01/2016

## 2016-10-01 NOTE — H&P (Signed)
ORTHOPAEDIC H&P  REQUESTING PHYSICIAN: Rod Can, MD  PCP:  Cathlean Cower, MD  Chief Complaint: Surgical site infection, right hip  HPI: Tanya [name] is a 60 y.o. female who underwent primary right total hip replacement about 2 weeks ago. She is developed wound drainage and erythema. She presents today for debridement and wound exploration, possible head and liner exchange.  Past Medical History:  Diagnosis Date  . Allergic rhinitis   . Blepharospasm 10/09/2010  . Cancer (HCC)    Skin  . Hip pain   . History of melanoma excision    MULTIPLE SITES  . Hypertension   . Mild intermittent asthma   . OA (osteoarthritis)   . PMB (postmenopausal bleeding)   . PONV (postoperative nausea and vomiting)    did not experience after D&C, request to use same medicaitons from that procedure  . Precancerous changes of the cervix   . Sleep apnea    tested in home around 2 yrs ago  . Varicose vein    wears compression stocking   Past Surgical History:  Procedure Laterality Date  . ABDOMINAL HYSTERECTOMY    . COLONOSCOPY    . DILATION AND CURETTAGE OF UTERUS    . ESOPHAGOGASTRODUODENOSCOPY  10/14/2004  . HAMMER TOE SURGERY    . HYSTEROSCOPY W/D&C N/A 04/07/2016   Procedure: DILATATION AND CURETTAGE /HYSTEROSCOPY WITH MYOSURE POLYPECTOMY;  Surgeon: Cheri Fowler, MD;  Location: [facility];  Service: Gynecology;  Laterality: N/A;  . JOINT REPLACEMENT    . KNEE ARTHROSCOPY Bilateral left 08-01-2005/  right  . LAPAROSCOPIC HYSTERECTOMY N/A 06/02/2016   Procedure: HYSTERECTOMY TOTAL LAPAROSCOPIC;  Surgeon: Cheri Fowler, MD;  Location: [facility];  Service: Gynecology;  Laterality: N/A;  . LAPAROSCOPIC SALPINGO OOPHERECTOMY Bilateral 06/02/2016   Procedure: LAPAROSCOPIC SALPINGO OOPHORECTOMY;  Surgeon: Cheri Fowler, MD;  Location: [facility];  Service: Gynecology;  Laterality: Bilateral;  . RE-DO ROTATOR CUFF REPAIR/  SAD/  ACROMIOPLASTY Right 01-04-2009  . SHOULDER  ARTHROSCOPY W/ ROTATOR CUFF REPAIR Right   . tailor bunion removal    . TONSILLECTOMY    . TOTAL HIP ARTHROPLASTY Right 09/12/2016   Procedure: RIGHT TOTAL HIP ARTHROPLASTY ANTERIOR APPROACH;  Surgeon: Rod Can, MD;  Location: [facility];  Service: Orthopedics;  Laterality: Right;  . TOTAL KNEE ARTHROPLASTY Left 12/29/2013   Procedure: LEFT TOTAL KNEE ARTHROPLASTY;  Surgeon: Johnn Hai, MD;  Location: [facility];  Service: Orthopedics;  Laterality: Left;   Social History   Social History  . Marital status: Married    Spouse name: N/A  . Number of children: N/A  . Years of education: N/A   Occupational History  . Web designer    Social History Main Topics  . Smoking status: Former Smoker    Packs/day: 2.00    Years: 10.00    Types: Cigarettes    Quit date: 11/25/1987  . Smokeless tobacco: Never Used  . Alcohol use 1.2 oz/week    2 Glasses of wine per week     Comment: occ  . Drug use: No  . Sexual activity: Yes   Other Topics Concern  . None   Social History Narrative   1 dog and 1 cat   Family History  Problem Relation Age of Onset  . Esophageal cancer Father   . Melanoma Sister   . Diabetes Other   . Ovarian cancer Other   . Asthma Other     siblings   Allergies  Allergen Reactions  . Ace  Inhibitors Hives  . Amoxicillin Hives  . Other Hives    Nucafed  . Penicillins Hives    Has patient had a PCN reaction causing immediate rash, facial/tongue/throat swelling, SOB or lightheadedness with hypotension: Yes Has patient had a PCN reaction causing severe rash involving mucus membranes or skin necrosis: No Has patient had a PCN reaction that required hospitalization No Has patient had a PCN reaction occurring within the last 10 years: No If all of the above answers are "NO", then may proceed with Cephalosporin use.   . Pseudoephedrine Hives  . Pseudoephedrine-Codeine Hives   Prior to Admission medications   Medication Sig Start Date End Date Taking?  Authorizing Provider  amLODipine (NORVASC) 10 MG tablet TAKE 1 TABLET (10 MG TOTAL) BY MOUTH EVERY MORNING. 03/10/16  Yes Biagio Borg, MD  aspirin 81 MG tablet Take 1 tablet (81 mg total) by mouth 2 (two) times daily after a meal. 09/12/16  Yes Rod Can, MD  calcium acetate (PHOSLO) 667 MG capsule TAKE 1 CAPSULE (667 MG TOTAL) BY MOUTH AT BEDTIME. 02/11/16  Yes Biagio Borg, MD  celecoxib (CELEBREX) 200 MG capsule TAKE 1 CAPSULE (200 MG TOTAL) BY MOUTH 2 (TWO) TIMES DAILY. 07/31/16  Yes Biagio Borg, MD  cetirizine (ZYRTEC) 10 MG tablet TAKE 1 TABLET (10 MG TOTAL) BY MOUTH DAILY. Patient taking differently: Take 10 mg by mouth daily. TAKE 1 TABLET (10 MG TOTAL) BY MOUTH DAILY. 02/11/16  Yes Biagio Borg, MD  doxycycline (VIBRA-TABS) 100 MG tablet Take 100 mg by mouth 2 (two) times daily.   Yes Historical Provider, MD  Glucosamine-Chondroit-Vit C-Mn (GLUCOSAMINE 1500 COMPLEX PO) Take 1 tablet by mouth 2 (two) times daily.    Yes Historical Provider, MD  HYDROcodone-acetaminophen (NORCO) 5-325 MG tablet Take 1-2 tablets by mouth every 4 (four) hours as needed for moderate pain. 09/12/16  Yes Rod Can, MD  methocarbamol (ROBAXIN) 500 MG tablet Take 500 mg by mouth 4 (four) times daily.   Yes Historical Provider, MD  montelukast (SINGULAIR) 10 MG tablet TAKE 1 TABLET (10 MG TOTAL) BY MOUTH EVERY MORNING. 02/11/16  Yes Biagio Borg, MD  Multiple Vitamin (MULTIVITAMIN WITH MINERALS) TABS tablet Take 1 tablet by mouth daily.   Yes Historical Provider, MD  sulfamethoxazole-trimethoprim (BACTRIM DS,SEPTRA DS) 800-160 MG tablet Take 1 tablet by mouth 2 (two) times daily.   Yes Historical Provider, MD  valsartan (DIOVAN) 160 MG tablet Take 1 tablet (160 mg total) by mouth every morning. Patient taking differently: Take 160 mg by mouth daily.  02/11/16  Yes Biagio Borg, MD  albuterol (PROVENTIL HFA;VENTOLIN HFA) 108 (90 Base) MCG/ACT inhaler Inhale 2 puffs into the lungs every 6 (six) hours as needed for  wheezing or shortness of breath. Patient not taking: Reported on 10/01/2016 04/08/16   Rigoberto Noel, MD  docusate sodium (COLACE) 100 MG capsule Take 1 capsule (100 mg total) by mouth 2 (two) times daily. Patient not taking: Reported on 10/01/2016 09/12/16   Rod Can, MD  ondansetron (ZOFRAN) 4 MG tablet Take 1 tablet (4 mg total) by mouth every 8 (eight) hours as needed for nausea or vomiting. Patient not taking: Reported on 10/01/2016 09/12/16   Rod Can, MD  senna (SENOKOT) 8.6 MG TABS tablet Take 2 tablets (17.2 mg total) by mouth at bedtime. Patient not taking: Reported on 10/01/2016 09/12/16   Rod Can, MD   Dg Chest Port 1 View  Result Date: 10/01/2016 CLINICAL DATA:  Preop testing  for right hip debridement EXAM: PORTABLE CHEST 1 VIEW COMPARISON:  12/23/2013 FINDINGS: The heart size and mediastinal contours are within normal limits. Both lungs are clear. The visualized skeletal structures are unremarkable. IMPRESSION: No active disease. Electronically Signed   By: Kathreen Devoid   On: 10/01/2016 11:15    Positive ROS: All other systems have been reviewed and were otherwise negative with the exception of those mentioned in the HPI and as above.  Physical Exam: General: Alert, no acute distress Cardiovascular: No pedal edema Respiratory: No cyanosis, no use of accessory musculature GI: No organomegaly, abdomen is soft and non-tender Skin: No lesions in the area of chief complaint Neurologic: Sensation intact distally Psychiatric: Patient is competent for consent with normal mood and affect Lymphatic: No axillary or cervical lymphadenopathy  MUSCULOSKELETAL: Examination of the right hip reveals that the superior third of her incision she does have a small dehiscence with periwound erythema. She has serosanguineous drainage. She has painless range of motion of the hip. There is no significant pedal edema no calf tenderness to palpation. She has palpable pulses she has  positive motor function dorsiflexion, plantarflexion, and great toe extension. Sensation is intact to light touch in all distributions.  Assessment: Surgical site infection, right hip  Plan: I discussed the findings with the patient and her husband. The plan will be for operative debridement and wound exploration, possible head ball and liner exchange, possible 1 stage revision. We discussed the risks, benefits, and alternatives. The most worrisome issue would be incomplete resolution of her infection which would require future surgery to include a two-stage procedure.  The risks, benefits, and alternatives were discussed with the patient. There are risks associated with the surgery including, but not limited to, problems with anesthesia (death), infection, instability (giving out of the joint), dislocation, differences in leg length/angulation/rotation, fracture of bones, loosening or failure of implants, hematoma (blood accumulation) which may require surgical drainage, blood clots, pulmonary embolism, nerve injury (foot drop and lateral thigh numbness), and blood vessel injury. The patient understands these risks and elects to proceed.

## 2016-10-01 NOTE — Anesthesia Preprocedure Evaluation (Signed)
Anesthesia Evaluation  Patient identified by MRN, date of birth, ID band Patient awake    Reviewed: Allergy & Precautions, NPO status , Patient's Chart, lab work & pertinent test results  History of Anesthesia Complications (+) PONV  Airway Mallampati: II   Neck ROM: full    Dental   Pulmonary asthma , sleep apnea , former smoker,    breath sounds clear to auscultation       Cardiovascular hypertension,  Rhythm:regular Rate:Normal     Neuro/Psych  Neuromuscular disease    GI/Hepatic   Endo/Other    Renal/GU      Musculoskeletal  (+) Arthritis ,   Abdominal   Peds  Hematology   Anesthesia Other Findings   Reproductive/Obstetrics                             Anesthesia Physical Anesthesia Plan  ASA: II  Anesthesia Plan: General   Post-op Pain Management:    Induction: Intravenous  Airway Management Planned: Oral ETT  Additional Equipment:   Intra-op Plan:   Post-operative Plan: Extubation in OR  Informed Consent: I have reviewed the patients History and Physical, chart, labs and discussed the procedure including the risks, benefits and alternatives for the proposed anesthesia with the patient or authorized representative who has indicated his/her understanding and acceptance.     Plan Discussed with: CRNA, Anesthesiologist and Surgeon  Anesthesia Plan Comments:         Anesthesia Quick Evaluation

## 2016-10-01 NOTE — Brief Op Note (Signed)
10/01/2016  6:55 PM  PATIENT:  Tanya [name]  60 y.o. female  PRE-OPERATIVE DIAGNOSIS:  INFECTED RIGHT HIP  POST-OPERATIVE DIAGNOSIS:  Superficial surgical site infection  PROCEDURE:  Procedure(s): IRRIGATION AND DEBRIDEMENT RIGHT HIP (Right)  SURGEON:  Surgeon(s) and Role:    * Rod Can, MD - Primary  PHYSICIAN ASSISTANT: n/a  ASSISTANTS: April green, rnfa   ANESTHESIA:   general  EBL:  Total I/O In: 500 [I.V.:500] Out: -   BLOOD ADMINISTERED:none  DRAINS: Prevena VAC   LOCAL MEDICATIONS USED:  NONE  SPECIMEN:  Source of Specimen:  1. superficial wound swab 2. right hip synovial fluid for culture  DISPOSITION OF SPECIMEN:  micro  COUNTS:  YES  TOURNIQUET:  * No tourniquets in log *  DICTATION: .Other Dictation: Dictation Number [id number]  PLAN OF CARE: Admit to inpatient   PATIENT DISPOSITION:  PACU - hemodynamically stable.   Delay start of Pharmacological VTE agent (>24hrs) due to surgical blood loss or risk of bleeding: no

## 2016-10-01 NOTE — Anesthesia Procedure Notes (Signed)
Procedure Name: Intubation Date/Time: 10/01/2016 5:51 PM Performed by: Jacquiline Doe A Pre-anesthesia Checklist: Patient identified, Emergency Drugs available, Suction available and Patient being monitored Patient Re-evaluated:Patient Re-evaluated prior to inductionOxygen Delivery Method: Circle System Utilized and Circle system utilized Preoxygenation: Pre-oxygenation with 100% oxygen Intubation Type: IV induction and Cricoid Pressure applied Ventilation: Mask ventilation without difficulty and Oral airway inserted - appropriate to patient size Laryngoscope Size: Mac and 4 Grade View: Grade I Tube type: Oral Tube size: 7.5 mm Number of attempts: 1 Airway Equipment and Method: Stylet and Oral airway Placement Confirmation: ETT inserted through vocal cords under direct vision,  positive ETCO2 and breath sounds checked- equal and bilateral Secured at: 22 cm Tube secured with: Tape Dental Injury: Teeth and Oropharynx as per pre-operative assessment

## 2016-10-02 ENCOUNTER — Encounter (HOSPITAL_COMMUNITY): Payer: Self-pay | Admitting: Orthopedic Surgery

## 2016-10-02 LAB — BASIC METABOLIC PANEL
Anion gap: 8 (ref 5–15)
BUN: 14 mg/dL (ref 6–20)
CALCIUM: 9.1 mg/dL (ref 8.9–10.3)
CHLORIDE: 107 mmol/L (ref 101–111)
CO2: 24 mmol/L (ref 22–32)
CREATININE: 0.76 mg/dL (ref 0.44–1.00)
GFR calc non Af Amer: >60 mL/min (ref >60)
Glucose, Bld: 227 mg/dL — ABNORMAL HIGH (ref 65–99)
Potassium: 5.2 mmol/L — ABNORMAL HIGH (ref 3.5–5.1)
SODIUM: 139 mmol/L (ref 135–145)

## 2016-10-02 NOTE — Op Note (Signed)
NAMEGILI, COBBLER NO.:  [id number]  MEDICAL RECORD NO.:  [id number]  LOCATION:  [id number]                        FACILITY:  [facility]  PHYSICIAN:  Rod Can, MD     DATE OF BIRTH:  December 28, 1955  DATE OF PROCEDURE:  10/01/2016 DATE OF DISCHARGE:                              OPERATIVE REPORT   SURGEON:  Rod Can, M.D.  ASSISTANT:  April Green, RNFA.  PREOPERATIVE DIAGNOSIS:  Surgical site infection, right hip.  POSTOPERATIVE DIAGNOSIS:  Superficial surgical site infection, right hip.  PROCEDURE PERFORMED: 1. Debridement of skin and subcutaneous tissue, right hip. 2. Placement of Prevena wound VAC.  ANESTHESIA:  General.  ESTIMATED BLOOD LOSS:  50 mL.  ANTIBIOTICS:  1.5 g vancomycin.  COMPLICATIONS:  None.  SPECIMENS: 1. Superficial wound swab right hip for culture. 2. Right hip synovial fluid for culture.  TUBES AND DRAINS:  None.  DISPOSITION:  Stable to PACU.  INDICATIONS:  The patient is a 60 year old female who underwent primary right total hip replacement by myself about 2 weeks ago.  She developed a small area of dehiscence in her pannicular fold with persistent wound drainage.  I discussed the risks, benefits, alternatives to debridement and closure, she elected to proceed.  DESCRIPTION OF PROCEDURE IN DETAIL:  I identified the patient in the holding area.  I marked the surgical site.  She was taken to the operating room.  General anesthesia was induced.  She was transferred to the [facility] table.  The right hip was prepped and draped in the normal sterile surgical fashion.  Time-out was called verifying side and site of surgery.  I utilized her previous incision and I opened the proximal third of her incision with a 10 blade.  She had a small superficial dehiscence.  There were no fluid collections.  There was no purulent material.  I did swab this area for culture.  I then used a 15 blade to excisionally debride a small amount of  necrotic skin and fat.  I then used blunt dissection to dissect deep down to the fascia.  I exposed the fascia for the whole proximal third of the incision.  The fascia was completely intact.  I then used a rongeur to excisionally debride some fatty material.  I used a curette to debride the top surface of the fascia.  I then irrigated the wound with 3 L of saline.  I changed my gloves.  I then used an 18-gauge spinal needle, which I inserted into the femoral neck.  I aspirated about 2 mL of clear joint fluid, which I sent for a separate culture.  There was no evidence that the infection tracked deep, therefore I did not enter her hip deep to the fascia.  I then closed the wound with layers of 2-0 interrupted Monocryl to close the dead space.  The skin was closed with 2-0 nylon with a vertical mattress technique.  I then applied a Prevena wound VAC.  The patient was then extubated, taken to PACU in stable condition.  Sponge, needle, and instrument counts were correct at the end of the case x2.  There were no known complications.  We will readmit the patient  to the hospital.  I will place her on vancomycin while cultures are pending.  She may weight bear as tolerated.  Activity as tolerated.  I will need to see her in the office 1 week after discharge to remove her VAC.          ______________________________ Rod Can, MD     BS/MEDQ  D:  10/01/2016  T:  10/02/2016  Job:  [id number]

## 2016-10-02 NOTE — Progress Notes (Signed)
   Subjective:  Patient reports pain as mild to moderate.  No c/o.  Objective:   VITALS:   Vitals:   10/01/16 2158 10/01/16 2303 10/02/16 0005 10/02/16 0639  BP: (!) 101/56 103/62 104/72 (!) 102/55  Pulse: 74 84 89 77  Resp: 15 16 16 16   Temp: 98.1 F (36.7 C) 98.1 F (36.7 C) 98.1 F (36.7 C) 98 F (36.7 C)  TempSrc: Oral Oral Oral   SpO2: 99% 98%  98%  Weight:      Height:        NAD ABD soft Sensation intact distally Intact pulses distally Dorsiflexion/Plantar flexion intact Incision: dressing C/D/I Compartment soft VAC intact   Lab Results  Component Value Date   WBC 6.1 10/01/2016   HGB 10.8 (L) 10/01/2016   HCT 34.4 (L) 10/01/2016   MCV 92.0 10/01/2016   PLT 290 10/01/2016   BMET    Component Value Date/Time   NA 139 10/02/2016 0501   K 5.2 (H) 10/02/2016 0501   CL 107 10/02/2016 0501   CO2 24 10/02/2016 0501   GLUCOSE 227 (H) 10/02/2016 0501   BUN 14 10/02/2016 0501   CREATININE 0.76 10/02/2016 0501   CALCIUM 9.1 10/02/2016 0501   GFRNONAA >60 10/02/2016 0501   GFRAA >60 10/02/2016 0501     Recent Results (from the past 240 hour(s))  MRSA PCR Screening     Status: None   Collection Time: 10/01/16 10:19 AM  Result Value Ref Range Status   MRSA by PCR NEGATIVE NEGATIVE Final    Comment:        The GeneXpert MRSA Assay (FDA approved for NASAL specimens only), is one component of a comprehensive MRSA colonization surveillance program. It is not intended to diagnose MRSA infection nor to guide or monitor treatment for MRSA infections.   Aerobic/Anaerobic Culture (surgical/deep wound)     Status: None (Preliminary result)   Collection Time: 10/01/16  6:30 PM  Result Value Ref Range Status   Specimen Description SYNOVIAL RIGHT HIP  Final   Special Requests NONE  Final   Gram Stain   Final    ABUNDANT WBC PRESENT, PREDOMINANTLY MONONUCLEAR NO ORGANISMS SEEN    Culture PENDING  Incomplete   Report Status PENDING  Incomplete    Aerobic/Anaerobic Culture (surgical/deep wound)     Status: None (Preliminary result)   Collection Time: 10/01/16  6:35 PM  Result Value Ref Range Status   Specimen Description WOUND RIGHT HIP  Final   Special Requests DRAINAGE FROM POST OP RIGHT HIP  Final   Gram Stain   Final    ABUNDANT WBC PRESENT,BOTH PMN AND MONONUCLEAR NO ORGANISMS SEEN    Culture PENDING  Incomplete   Report Status PENDING  Incomplete       Assessment/Plan: 1 Day Post-Op   Principal Problem:   Surgical site infection    WBAT with walker DVT ppx: ASA, SCDs, TEDs Vanc for now Cont Prevena VAC Follow cultures Dispo: D/C home after cultures finalized, likely Saturday    Tanya [name] 10/02/2016, 7:49 AM   Rod Can, MD Cell [phone]

## 2016-10-02 NOTE — Progress Notes (Signed)
Rept received from [facility]. Pt resting semi fowler in bed. Pt denies pain. No s/sx of resp distress or discomfort. Order placed to obtain pt's living will. Pt repts that her living will is at her "attorney's office" and she has no way to get a copy for this [place]. Pt states, "I will bring it the next time I come in". Will continue to monitor.

## 2016-10-02 NOTE — Progress Notes (Signed)
Inpatient Diabetes Program Recommendations  AACE/ADA: New Consensus Statement on Inpatient Glycemic Control (2015)  Target Ranges:  Prepandial:   less than 140 mg/dL      Peak postprandial:   less than 180 mg/dL (1-2 hours)      Critically ill patients:  140 - 180 mg/dL   Lab Results  Component Value Date   HGBA1C 6.4 01/10/2016    Review of Glycemic Control  Diabetes history: No prior hx  Inpatient Diabetes Program Recommendations:  Please consider A1c to determine prehospital glycemic control. Last A1c 01/10/16 6.4 (prediabetes range). Will follow.  Thank you, Nani Gasser. Douglas Rooks, RN, MSN, CDE Inpatient Glycemic Control Team Team Pager [phone] (8am-5pm) 10/02/2016 11:27 AM

## 2016-10-03 LAB — BASIC METABOLIC PANEL
Anion gap: 5 (ref 5–15)
BUN: 15 mg/dL (ref 6–20)
CALCIUM: 8.8 mg/dL — AB (ref 8.9–10.3)
CHLORIDE: 109 mmol/L (ref 101–111)
CO2: 27 mmol/L (ref 22–32)
CREATININE: 0.74 mg/dL (ref 0.44–1.00)
GFR calc Af Amer: >60 mL/min (ref >60)
GFR calc non Af Amer: >60 mL/min (ref >60)
GLUCOSE: 116 mg/dL — AB (ref 65–99)
Potassium: 4.7 mmol/L (ref 3.5–5.1)
Sodium: 141 mmol/L (ref 135–145)

## 2016-10-03 NOTE — Discharge Summary (Signed)
Physician Discharge Summary  Patient ID: Tanya [name] MRN: [id number] DOB/AGE: 02-Mar-1956 60 y.o.  Admit date: 10/01/2016 Discharge date: 10/05/2016  Admission Diagnoses:  Surgical site infection  Discharge Diagnoses:  Principal Problem:   Surgical site infection   Past Medical History:  Diagnosis Date  . Allergic rhinitis   . Blepharospasm 10/09/2010  . Cancer (HCC)    Skin  . Hip pain   . History of melanoma excision    MULTIPLE SITES  . Hypertension   . Mild intermittent asthma   . OA (osteoarthritis)   . PMB (postmenopausal bleeding)   . PONV (postoperative nausea and vomiting)    did not experience after D&C, request to use same medicaitons from that procedure  . Precancerous changes of the cervix   . Sleep apnea    tested in home around 2 yrs ago  . Varicose vein    wears compression stocking    Surgeries: Procedure(s): IRRIGATION AND DEBRIDEMENT RIGHT HIP on 10/01/2016   Consultants (if any):   Discharged Condition: Improved  Hospital Course: Tanya [name] is an 60 y.o. female who was admitted 10/01/2016 with a diagnosis of Surgical site infection and went to the operating room on 10/01/2016 and underwent the above named procedures.    She was given perioperative antibiotics:  Anti-infectives    Start     Dose/Rate Route Frequency Ordered Stop   10/02/16 0600  vancomycin (VANCOCIN) IVPB 1000 mg/200 mL premix  Status:  Discontinued     1,000 mg 200 mL/hr over 60 Minutes Intravenous Every 12 hours 10/01/16 2017 10/05/16 1336   10/01/16 1700  vancomycin (VANCOCIN) 1,500 mg in sodium chloride 0.9 % 500 mL IVPB     1,500 mg 250 mL/hr over 120 Minutes Intravenous To ShortStay Surgical 10/01/16 1029 10/01/16 1920    .  She was given sequential compression devices, early ambulation, and ASA for DVT prophylaxis.  She received vancomycin, which was discontinued when her cultures were finalized negative.   She benefited maximally from the hospital stay and  there were no complications.    Recent vital signs:  Vitals:   10/05/16 0532 10/05/16 0912  BP: (!) 122/55 119/71  Pulse: 77 77  Resp:  16  Temp: 98 F (36.7 C)     Recent laboratory studies:  Lab Results  Component Value Date   HGB 10.8 (L) 10/01/2016   HGB 10.0 (L) 09/13/2016   HGB 14.4 09/01/2016   Lab Results  Component Value Date   WBC 6.1 10/01/2016   PLT 290 10/01/2016   Lab Results  Component Value Date   INR 0.97 12/23/2013   Lab Results  Component Value Date   NA 142 10/04/2016   K 4.4 10/04/2016   CL 106 10/04/2016   CO2 29 10/04/2016   BUN 12 10/04/2016   CREATININE 0.70 10/04/2016   GLUCOSE 131 (H) 10/04/2016    Discharge Medications:     Medication List    STOP taking these medications   doxycycline 100 MG tablet Commonly known as:  VIBRA-TABS   sulfamethoxazole-trimethoprim 800-160 MG tablet Commonly known as:  BACTRIM DS,SEPTRA DS     TAKE these medications   albuterol 108 (90 Base) MCG/ACT inhaler Commonly known as:  PROVENTIL HFA;VENTOLIN HFA Inhale 2 puffs into the lungs every 6 (six) hours as needed for wheezing or shortness of breath.   amLODipine 10 MG tablet Commonly known as:  NORVASC TAKE 1 TABLET (10 MG TOTAL) BY MOUTH EVERY MORNING.   aspirin  81 MG tablet Take 1 tablet (81 mg total) by mouth 2 (two) times daily after a meal.   calcium acetate 667 MG capsule Commonly known as:  PHOSLO TAKE 1 CAPSULE (667 MG TOTAL) BY MOUTH AT BEDTIME.   celecoxib 200 MG capsule Commonly known as:  CELEBREX TAKE 1 CAPSULE (200 MG TOTAL) BY MOUTH 2 (TWO) TIMES DAILY.   cetirizine 10 MG tablet Commonly known as:  ZYRTEC TAKE 1 TABLET (10 MG TOTAL) BY MOUTH DAILY. What changed:  how much to take  how to take this  when to take this  additional instructions   docusate sodium 100 MG capsule Commonly known as:  COLACE Take 1 capsule (100 mg total) by mouth 2 (two) times daily.   GLUCOSAMINE 1500 COMPLEX PO Take 1 tablet by  mouth 2 (two) times daily.   HYDROcodone-acetaminophen 5-325 MG tablet Commonly known as:  NORCO Take 1-2 tablets by mouth every 4 (four) hours as needed for moderate pain.   methocarbamol 500 MG tablet Commonly known as:  ROBAXIN Take 500 mg by mouth 4 (four) times daily.   montelukast 10 MG tablet Commonly known as:  SINGULAIR TAKE 1 TABLET (10 MG TOTAL) BY MOUTH EVERY MORNING.   multivitamin with minerals Tabs tablet Take 1 tablet by mouth daily.   ondansetron 4 MG tablet Commonly known as:  ZOFRAN Take 1 tablet (4 mg total) by mouth every 8 (eight) hours as needed for nausea or vomiting.   senna 8.6 MG Tabs tablet Commonly known as:  SENOKOT Take 2 tablets (17.2 mg total) by mouth at bedtime.   valsartan 160 MG tablet Commonly known as:  DIOVAN Take 1 tablet (160 mg total) by mouth every morning. What changed:  when to take this       Diagnostic Studies: Dg Pelvis Portable  Result Date: 09/12/2016 CLINICAL DATA:  Post right total hip arthroplasty. EXAM: PORTABLE PELVIS 1-2 VIEWS COMPARISON:  09/20/2016 FINDINGS: Single view of the pelvis demonstrates a right total hip arthroplasty. The entire femoral stem is visualized. Negative for a periprosthetic fracture. The hip appears located on this single view. No gross abnormality to the left hip joint. Visualized pelvic ring is intact. Multiple phleboliths in the pelvis. IMPRESSION: Right hip arthroplasty without complicating features. Electronically Signed   By: Markus Daft M.D.   On: 09/12/2016 13:37   Dg Chest Port 1 View  Result Date: 10/01/2016 CLINICAL DATA:  Preop testing for right hip debridement EXAM: PORTABLE CHEST 1 VIEW COMPARISON:  12/23/2013 FINDINGS: The heart size and mediastinal contours are within normal limits. Both lungs are clear. The visualized skeletal structures are unremarkable. IMPRESSION: No active disease. Electronically Signed   By: Kathreen Devoid   On: 10/01/2016 11:15   Dg C-arm Gt 120 Min  Result  Date: 09/12/2016 CLINICAL DATA:  Surgery: Right AA Hip  Fluoro Time: 46 Sec EXAM: OPERATIVE RIGHT HIP (WITH PELVIS IF PERFORMED) 2 VIEWS TECHNIQUE: Fluoroscopic spot image(s) were submitted for interpretation post-operatively. COMPARISON:  None. FINDINGS: Two intraoperative fluoroscopic images right hip arthroplasty hardware to be appropriately positioned. Surrounding osseous structures appear intact and anatomic in alignment. 46 seconds of fluoroscopy was provided for the procedure. IMPRESSION: Intraoperative fluoroscopic images showing right hip arthroplasty hardware. No evidence of surgical complicating feature. Electronically Signed   By: Franki Cabot M.D.   On: 09/12/2016 10:33   Dg Hip Operative Unilat W Or W/o Pelvis Right  Result Date: 09/12/2016 CLINICAL DATA:  Surgery: Right AA Hip  Fluoro Time: 39  Sec EXAM: OPERATIVE RIGHT HIP (WITH PELVIS IF PERFORMED) 2 VIEWS TECHNIQUE: Fluoroscopic spot image(s) were submitted for interpretation post-operatively. COMPARISON:  None. FINDINGS: Two intraoperative fluoroscopic images right hip arthroplasty hardware to be appropriately positioned. Surrounding osseous structures appear intact and anatomic in alignment. 46 seconds of fluoroscopy was provided for the procedure. IMPRESSION: Intraoperative fluoroscopic images showing right hip arthroplasty hardware. No evidence of surgical complicating feature. Electronically Signed   By: Franki Cabot M.D.   On: 09/12/2016 10:33    Disposition: 01-Home or Self Care  Discharge Instructions    Call MD / Call 911    Complete by:  As directed    If you experience chest pain or shortness of breath, CALL 911 and be transported to the hospital emergency room.  If you develope a fever above 101 F, pus (white drainage) or increased drainage or redness at the wound, or calf pain, call your surgeon's office.   Constipation Prevention    Complete by:  As directed    Drink plenty of fluids.  Prune juice may be helpful.   You may use a stool softener, such as Colace (over the counter) 100 mg twice a day.  Use MiraLax (over the counter) for constipation as needed.   Diet - low sodium heart healthy    Complete by:  As directed    Discharge instructions    Complete by:  As directed    INSTRUCTIONS AFTER JOINT REPLACEMENT   Remove items at home which could result in a fall. This includes throw rugs or furniture in walking pathways ICE to the affected joint every three hours while awake for 30 minutes at a time, for at least the first 3-5 days, and then as needed for pain and swelling.  Continue to use ice for pain and swelling. You may notice swelling that will progress down to the foot and ankle.  This is normal after surgery.  Elevate your leg when you are not up walking on it.   Continue to use the breathing machine you got in the hospital (incentive spirometer) which will help keep your temperature down.  It is common for your temperature to cycle up and down following surgery, especially at night when you are not up moving around and exerting yourself.  The breathing machine keeps your lungs expanded and your temperature down.   DIET:  As you were doing prior to hospitalization, we recommend a well-balanced diet.  DRESSING / WOUND CARE / SHOWERING  Keep the surgical dressing until follow up.  The dressing is water proof, so you can shower without any extra covering.  IF THE DRESSING FALLS OFF or the wound gets wet inside, change the dressing with sterile gauze.  Please use good hand washing techniques before changing the dressing.  Do not use any lotions or creams on the incision until instructed by your surgeon.    ACTIVITY  Increase activity slowly as tolerated, but follow the weight bearing instructions below.   No driving for 6 weeks or until further direction given by your physician.  You cannot drive while taking narcotics.  No lifting or carrying greater than 10 lbs. until further directed by your  surgeon. Avoid periods of inactivity such as sitting longer than an hour when not asleep. This helps prevent blood clots.  You may return to work once you are authorized by your doctor.     WEIGHT BEARING   Weight bearing as tolerated with assist device (walker, cane, etc) as directed, use  it as long as suggested by your surgeon or therapist, typically at least 4-6 weeks.   EXERCISES  Results after joint replacement surgery are often greatly improved when you follow the exercise, range of motion and muscle strengthening exercises prescribed by your doctor. Safety measures are also important to protect the joint from further injury. Any time any of these exercises cause you to have increased pain or swelling, decrease what you are doing until you are comfortable again and then slowly increase them. If you have problems or questions, call your caregiver or physical therapist for advice.   Rehabilitation is important following a joint replacement. After just a few days of immobilization, the muscles of the leg can become weakened and shrink (atrophy).  These exercises are designed to build up the tone and strength of the thigh and leg muscles and to improve motion. Often times heat used for twenty to thirty minutes before working out will loosen up your tissues and help with improving the range of motion but do not use heat for the first two weeks following surgery (sometimes heat can increase post-operative swelling).   These exercises can be done on a training (exercise) mat, on the floor, on a table or on a bed. Use whatever works the best and is most comfortable for you.    Use music or television while you are exercising so that the exercises are a pleasant break in your day. This will make your life better with the exercises acting as a break in your routine that you can look forward to.   Perform all exercises about fifteen times, three times per day or as directed.  You should exercise both the  operative leg and the other leg as well.   Exercises include:   Quad Sets - Tighten up the muscle on the front of the thigh (Quad) and hold for 5-10 seconds.   Straight Leg Raises - With your knee straight (if you were given a brace, keep it on), lift the leg to 60 degrees, hold for 3 seconds, and slowly lower the leg.  Perform this exercise against resistance later as your leg gets stronger.  Leg Slides: Lying on your back, slowly slide your foot toward your buttocks, bending your knee up off the floor (only go as far as is comfortable). Then slowly slide your foot back down until your leg is flat on the floor again.  Angel Wings: Lying on your back spread your legs to the side as far apart as you can without causing discomfort.  Hamstring Strength:  Lying on your back, push your heel against the floor with your leg straight by tightening up the muscles of your buttocks.  Repeat, but this time bend your knee to a comfortable angle, and push your heel against the floor.  You may put a pillow under the heel to make it more comfortable if necessary.   A rehabilitation program following joint replacement surgery can speed recovery and prevent re-injury in the future due to weakened muscles. Contact your doctor or a physical therapist for more information on knee rehabilitation.    CONSTIPATION  Constipation is defined medically as fewer than three stools per week and severe constipation as less than one stool per week.  Even if you have a regular bowel pattern at home, your normal regimen is likely to be disrupted due to multiple reasons following surgery.  Combination of anesthesia, postoperative narcotics, change in appetite and fluid intake all can affect your bowels.  YOU MUST use at least one of the following options; they are listed in order of increasing strength to get the job done.  They are all available over the counter, and you may need to use some, POSSIBLY even all of these options:     Drink plenty of fluids (prune juice may be helpful) and high fiber foods Colace 100 mg by mouth twice a day  Senokot for constipation as directed and as needed Dulcolax (bisacodyl), take with full glass of water  Miralax (polyethylene glycol) once or twice a day as needed.  If you have tried all these things and are unable to have a bowel movement in the first 3-4 days after surgery call either your surgeon or your primary doctor.    If you experience loose stools or diarrhea, hold the medications until you stool forms back up.  If your symptoms do not get better within 1 week or if they get worse, check with your doctor.  If you experience "the worst abdominal pain ever" or develop nausea or vomiting, please contact the office immediately for further recommendations for treatment.   ITCHING:  If you experience itching with your medications, try taking only a single pain pill, or even half a pain pill at a time.  You can also use Benadryl over the counter for itching or also to help with sleep.   TED HOSE STOCKINGS:  Use stockings on both legs until for at least 2 weeks or as directed by physician office. They may be removed at night for sleeping.  MEDICATIONS:  See your medication summary on the "After Visit Summary" that nursing will review with you.  You may have some home medications which will be placed on hold until you complete the course of blood thinner medication.  It is important for you to complete the blood thinner medication as prescribed.  PRECAUTIONS:  If you experience chest pain or shortness of breath - call 911 immediately for transfer to the hospital emergency department.   If you develop a fever greater that 101 F, purulent drainage from wound, increased redness or drainage from wound, foul odor from the wound/dressing, or calf pain - CONTACT YOUR SURGEON.                                                   FOLLOW-UP APPOINTMENTS:  If you do not already have a post-op  appointment, please call the office for an appointment to be seen by your surgeon.  Guidelines for how soon to be seen are listed in your "After Visit Summary", but are typically between 1-4 weeks after surgery.  OTHER INSTRUCTIONS:   Knee Replacement:  Do not place pillow under knee, focus on keeping the knee straight while resting. CPM instructions: 0-90 degrees, 2 hours in the morning, 2 hours in the afternoon, and 2 hours in the evening. Place foam block, curve side up under heel at all times except when in CPM or when walking.  DO NOT modify, tear, cut, or change the foam block in any way.  MAKE SURE YOU:  Understand these instructions.  Get help right away if you are not doing well or get worse.    Thank you for letting us be a part of your medical care team.  It is a privilege we respect greatly.  We hope these instructions  will help you stay on track for a fast and full recovery!   Increase activity slowly as tolerated    Complete by:  As directed       Follow-up Information    Jastin Fore, Horald Pollen, MD. Schedule an appointment as soon as possible for a visit in 1 week(s).   Specialty:  Orthopedic Surgery Why:  For wound re-check Contact information: [address] [address] [phone]            Signed: Elie Goody 10/08/2016, 4:02 PM

## 2016-10-03 NOTE — Progress Notes (Signed)
   Subjective:  Patient reports pain as mild to moderate.  No c/o.  Objective:   VITALS:   Vitals:   10/02/16 0639 10/02/16 1500 10/02/16 1953 10/03/16 0500  BP: (!) 102/55 109/67 (!) 111/57 (!) 105/58  Pulse: 77 92 81 76  Resp: 16 18 18 18   Temp: 98 F (36.7 C) 98.4 F (36.9 C) 98.9 F (37.2 C) 98.2 F (36.8 C)  TempSrc:  Oral Axillary Oral  SpO2: 98% 98% 100% 100%  Weight:      Height:        NAD ABD soft Sensation intact distally Intact pulses distally Dorsiflexion/Plantar flexion intact Incision: dressing C/D/I Compartment soft VAC intact   Lab Results  Component Value Date   WBC 6.1 10/01/2016   HGB 10.8 (L) 10/01/2016   HCT 34.4 (L) 10/01/2016   MCV 92.0 10/01/2016   PLT 290 10/01/2016   BMET    Component Value Date/Time   NA 141 10/03/2016 0238   K 4.7 10/03/2016 0238   CL 109 10/03/2016 0238   CO2 27 10/03/2016 0238   GLUCOSE 116 (H) 10/03/2016 0238   BUN 15 10/03/2016 0238   CREATININE 0.74 10/03/2016 0238   CALCIUM 8.8 (L) 10/03/2016 0238   GFRNONAA >60 10/03/2016 0238   GFRAA >60 10/03/2016 0238     Recent Results (from the past 240 hour(s))  MRSA PCR Screening     Status: None   Collection Time: 10/01/16 10:19 AM  Result Value Ref Range Status   MRSA by PCR NEGATIVE NEGATIVE Final    Comment:        The GeneXpert MRSA Assay (FDA approved for NASAL specimens only), is one component of a comprehensive MRSA colonization surveillance program. It is not intended to diagnose MRSA infection nor to guide or monitor treatment for MRSA infections.   Aerobic/Anaerobic Culture (surgical/deep wound)     Status: None (Preliminary result)   Collection Time: 10/01/16  6:30 PM  Result Value Ref Range Status   Specimen Description SYNOVIAL RIGHT HIP  Final   Special Requests NONE  Final   Gram Stain   Final    ABUNDANT WBC PRESENT, PREDOMINANTLY MONONUCLEAR NO ORGANISMS SEEN    Culture NO GROWTH < 24 HOURS  Final   Report Status PENDING   Incomplete  Aerobic/Anaerobic Culture (surgical/deep wound)     Status: None (Preliminary result)   Collection Time: 10/01/16  6:35 PM  Result Value Ref Range Status   Specimen Description WOUND RIGHT HIP  Final   Special Requests DRAINAGE FROM POST OP RIGHT HIP  Final   Gram Stain   Final    ABUNDANT WBC PRESENT,BOTH PMN AND MONONUCLEAR NO ORGANISMS SEEN    Culture NO GROWTH < 24 HOURS  Final   Report Status PENDING  Incomplete       Assessment/Plan: 2 Days Post-Op   Principal Problem:   Surgical site infection    WBAT with walker DVT ppx: ASA, SCDs, TEDs Vanc for now Cont Prevena VAC Follow cultures Dispo: D/C home after cultures finalized, likely Saturday    Tanya [name] 10/03/2016, 7:54 AM   Rod Can, MD Cell [phone]

## 2016-10-03 NOTE — Discharge Instructions (Signed)
Do not remove wound VAC Return to the office next Friday for [facility] removal

## 2016-10-04 LAB — BASIC METABOLIC PANEL
Anion gap: 7 (ref 5–15)
BUN: 12 mg/dL (ref 6–20)
CHLORIDE: 106 mmol/L (ref 101–111)
CO2: 29 mmol/L (ref 22–32)
Calcium: 9.1 mg/dL (ref 8.9–10.3)
Creatinine, Ser: 0.7 mg/dL (ref 0.44–1.00)
GFR calc Af Amer: >60 mL/min (ref >60)
GFR calc non Af Amer: >60 mL/min (ref >60)
Glucose, Bld: 131 mg/dL — ABNORMAL HIGH (ref 65–99)
POTASSIUM: 4.4 mmol/L (ref 3.5–5.1)
SODIUM: 142 mmol/L (ref 135–145)

## 2016-10-04 NOTE — Progress Notes (Signed)
Subjective: 3 Days Post-Op Procedure(s) (LRB): IRRIGATION AND DEBRIDEMENT RIGHT HIP (Right) Patient reports pain as 2 on 0-10 scale. Vac in place. Thus far,after 2-days ger Cultures show no growth. Will wait for final results since she has a total hip.When ready she will go home with a portable Vac.   Objective: Vital signs in last 24 hours: Temp:  [98.3 F (36.8 C)-98.7 F (37.1 C)] 98.3 F (36.8 C) (11/11 0500) Pulse Rate:  [80-88] 87 (11/10 2010) Resp:  [18] 18 (11/11 0500) BP: (105-124)/(59-64) 116/59 (11/11 0500) SpO2:  [98 %-100 %] 98 % (11/11 0500)  Intake/Output from previous day: 11/10 0701 - 11/11 0700 In: 2220 [P.O.:720; I.V.:1500] Out: 0  Intake/Output this shift: No intake/output data recorded.   Recent Labs  10/01/16 1203  HGB 10.8*    Recent Labs  10/01/16 1203  WBC 6.1  RBC 3.74*  HCT 34.4*  PLT 290    Recent Labs  10/03/16 0238 10/04/16 0610  NA 141 142  K 4.7 4.4  CL 109 106  CO2 27 29  BUN 15 12  CREATININE 0.74 0.70  GLUCOSE 116* 131*  CALCIUM 8.8* 9.1   No results for input(s): LABPT, INR in the last 72 hours.  No cellulitis present  Assessment/Plan: 3 Days Post-Op Procedure(s) (LRB): IRRIGATION AND DEBRIDEMENT RIGHT HIP (Right) Up with therapy  Tanya [name] A 10/04/2016, 8:09 AM

## 2016-10-04 NOTE — Progress Notes (Signed)
Pharmacy Antibiotic Note  60 y.o. female admitted on 10/01/2016 and started on IV vancomycin for superficial surgical wound infection s/p irrigation and debridement of R hip. She has been receiving  vancomycin 1000mg  every 12 hours without noted complications.  Currently afebrile, WBC WNL, SCr WNL. Cultures pending.  She is on her 6th dose - will need to consider planned LOT.  Plan: -Vancomycin 1000mg  q12h -F/U cultures -Monitor renal fxn, clinical course, LOT  Height: 5\' 7"  (170.2 cm) Weight: 248 lb (112.5 kg) IBW/kg (Calculated) : 61.6  Temp (24hrs), Avg:98.4 F (36.9 C), Min:98.3 F (36.8 C), Max:98.7 F (37.1 C)   Recent Labs Lab 10/01/16 1203 10/02/16 0501 10/03/16 0238 10/04/16 0610  WBC 6.1  --   --   --   CREATININE 0.58 0.76 0.74 0.70    Estimated Creatinine Clearance: 99.2 mL/min (by C-G formula based on SCr of 0.7 mg/dL).    Allergies  Allergen Reactions  . Ace Inhibitors Hives  . Amoxicillin Hives  . Other Hives    Nucafed  . Penicillins Hives    Has patient had a PCN reaction causing immediate rash, facial/tongue/throat swelling, SOB or lightheadedness with hypotension: Yes Has patient had a PCN reaction causing severe rash involving mucus membranes or skin necrosis: No Has patient had a PCN reaction that required hospitalization No Has patient had a PCN reaction occurring within the last 10 years: No If all of the above answers are "NO", then may proceed with Cephalosporin use.   . Pseudoephedrine Hives  . Pseudoephedrine-Codeine Hives    Antimicrobials this admission:   Dose adjustments this admission:   Microbiology results:  Rober Minion, PharmD., MS Clinical Pharmacist Pager:  [phone] Thank you for allowing pharmacy to be part of this patients care team. 9:52 AM, 10/04/2016

## 2016-10-05 NOTE — Progress Notes (Signed)
Patient ID: Tanya [name], female   DOB: 04-24-1956, 60 y.o.   MRN: [id number]  Subjective: 4 Days Post-Op Procedure(s) (LRB): IRRIGATION AND DEBRIDEMENT RIGHT HIP (Right)    Patient reports pain as mild.  No problems while in hospital.  Reviewed plans that she had discussed with [facility].  Wants to go home  Objective:   VITALS:   Vitals:   10/04/16 2014 10/05/16 0532  BP: 117/60 (!) 122/55  Pulse: 82 77  Resp:    Temp: 98.6 F (37 C) 98 F (36.7 C)    Neurovascular intact Incision: dressing C/D/I, superficial wound vac applied, no drainage   LABS No results for input(s): HGB, HCT, WBC, PLT in the last 72 hours.   Right hip wound cultures, NO GROWTH to date   Recent Labs  10/03/16 0238 10/04/16 0610  NA 141 142  K 4.7 4.4  BUN 15 12  CREATININE 0.74 0.70  GLUCOSE 116* 131*    No results for input(s): LABPT, INR in the last 72 hours.   Assessment/Plan: 4 Days Post-Op Procedure(s) (LRB): IRRIGATION AND DEBRIDEMENT RIGHT HIP (Right)  Plan: Will try and discharge today Will try to review with [facility] ? Dressing change prior to discharge Has PO antibiotics and pain meds at home Has portable vac unit ready for discharge Follow up with [facility] next week

## 2016-10-05 NOTE — Progress Notes (Signed)
Discharge instructions and prescriptions provided to patient.  Wound vac changed to Prevana.  No questions at time of discharge.

## 2016-10-06 LAB — AEROBIC/ANAEROBIC CULTURE W GRAM STAIN (SURGICAL/DEEP WOUND): Culture: NO GROWTH

## 2016-10-06 LAB — AEROBIC/ANAEROBIC CULTURE (SURGICAL/DEEP WOUND): CULTURE: NO GROWTH

## 2016-10-30 LAB — FUNGUS CULTURE RESULT

## 2016-10-30 LAB — FUNGUS CULTURE WITH STAIN

## 2016-10-30 LAB — FUNGAL ORGANISM REFLEX

## 2016-10-31 ENCOUNTER — Other Ambulatory Visit: Payer: Self-pay | Admitting: Internal Medicine

## 2016-11-19 ENCOUNTER — Telehealth: Payer: Self-pay | Admitting: Internal Medicine

## 2016-11-19 NOTE — Telephone Encounter (Signed)
Pt called stating [facility] told her to get start PA for Tramadol 200 mg (she is taking it for arthritis). Please call pt once its start

## 2016-12-17 ENCOUNTER — Other Ambulatory Visit: Payer: Self-pay | Admitting: Internal Medicine

## 2017-01-01 ENCOUNTER — Telehealth: Payer: Self-pay

## 2017-01-01 ENCOUNTER — Telehealth: Payer: Self-pay | Admitting: Internal Medicine

## 2017-01-01 NOTE — Telephone Encounter (Signed)
Per medication reconcileation she should still have a refill and it is no longer on her list.

## 2017-01-01 NOTE — Telephone Encounter (Signed)
Please advise patient is requesting refill on tramadol

## 2017-01-01 NOTE — Telephone Encounter (Signed)
Pt called in and needs refill of tramadol asap.  She said that she sent this refill in a long time ago and only has 2 left. She is frustrated that it has taken this long to get a refill

## 2017-01-01 NOTE — Telephone Encounter (Signed)
Sent to greg for refill

## 2017-01-02 NOTE — Telephone Encounter (Signed)
This patient needs Prior Authorization for her Tramadol sent to:  [id number] [address], [place] [phone] (Phone) [phone] (Fax)   Please follow up with patient.

## 2017-01-02 NOTE — Telephone Encounter (Signed)
This has been done.   [id number]

## 2017-01-05 NOTE — Telephone Encounter (Signed)
Pt left msg on triage stating pharmacy still waiting on PA on her Tramadol its been a week now without medication. Per chart MD assistant completed PA forwarding msg to corrine to f/u on PA, and call pt w/status...Johny Chess

## 2017-01-05 NOTE — Telephone Encounter (Signed)
Spoke to patient, sent more information to covermymeds for PA will call patient once determination is known

## 2017-01-08 ENCOUNTER — Other Ambulatory Visit: Payer: Self-pay | Admitting: Internal Medicine

## 2017-01-08 NOTE — Telephone Encounter (Signed)
Patient called back stating insurance company did not received additional information sent.  Please resend and follow back up with patient in regard.

## 2017-01-08 NOTE — Telephone Encounter (Signed)
Spoke to stacy at [employer]. PA was started again on tramadol 200mg . This PA  Was approved

## 2017-01-09 NOTE — Telephone Encounter (Signed)
This is not on patient's med list. It was prescribed by another provider. I will not be refilling medication.

## 2017-01-12 ENCOUNTER — Other Ambulatory Visit: Payer: Self-pay | Admitting: Internal Medicine

## 2017-01-12 MED ORDER — TRAMADOL HCL ER 200 MG PO TB24: 200.0000 mg | ORAL_TABLET | Freq: Every day | ORAL | 0 refills | Status: DC

## 2017-01-12 NOTE — Telephone Encounter (Signed)
error 

## 2017-01-12 NOTE — Telephone Encounter (Signed)
Patient called in again. Dr. Sharlet Salina checked the medication site. She sent her in a 30 day supply.

## 2017-01-24 ENCOUNTER — Other Ambulatory Visit: Payer: Self-pay | Admitting: Internal Medicine

## 2017-01-28 ENCOUNTER — Other Ambulatory Visit: Payer: Self-pay | Admitting: Internal Medicine

## 2017-02-09 ENCOUNTER — Other Ambulatory Visit (INDEPENDENT_AMBULATORY_CARE_PROVIDER_SITE_OTHER): Payer: Managed Care, Other (non HMO)

## 2017-02-09 DIAGNOSIS — Z0000 Encounter for general adult medical examination without abnormal findings: Secondary | ICD-10-CM

## 2017-02-09 DIAGNOSIS — R7302 Impaired glucose tolerance (oral): Secondary | ICD-10-CM

## 2017-02-09 LAB — URINALYSIS, ROUTINE W REFLEX MICROSCOPIC
Bilirubin Urine: NEGATIVE
Hgb urine dipstick: NEGATIVE
KETONES UR: NEGATIVE
Leukocytes, UA: NEGATIVE
Nitrite: NEGATIVE
PH: 7.5 (ref 5.0–8.0)
SPECIFIC GRAVITY, URINE: 1.01 (ref 1.000–1.030)
Total Protein, Urine: NEGATIVE
URINE GLUCOSE: NEGATIVE
UROBILINOGEN UA: 0.2 (ref 0.0–1.0)

## 2017-02-09 LAB — LIPID PANEL
Cholesterol: 128 mg/dL (ref 0–200)
HDL: 56.4 mg/dL (ref >39.00)
LDL Cholesterol: 58 mg/dL (ref 0–99)
NonHDL: 71.63
Total CHOL/HDL Ratio: 2
Triglycerides: 67 mg/dL (ref 0.0–149.0)
VLDL: 13.4 mg/dL (ref 0.0–40.0)

## 2017-02-09 LAB — BASIC METABOLIC PANEL
BUN: 16 mg/dL (ref 6–23)
CALCIUM: 9.9 mg/dL (ref 8.4–10.5)
CHLORIDE: 105 meq/L (ref 96–112)
CO2: 28 meq/L (ref 19–32)
CREATININE: 0.67 mg/dL (ref 0.40–1.20)
GFR: 95.75 mL/min (ref >60.00)
GLUCOSE: 130 mg/dL — AB (ref 70–99)
Potassium: 4.7 mEq/L (ref 3.5–5.1)
SODIUM: 141 meq/L (ref 135–145)

## 2017-02-09 LAB — CBC WITH DIFFERENTIAL/PLATELET
Basophils Absolute: 0 10*3/uL (ref 0.0–0.1)
Basophils Relative: 0.7 % (ref 0.0–3.0)
EOS ABS: 0.1 10*3/uL (ref 0.0–0.7)
Eosinophils Relative: 1.6 % (ref 0.0–5.0)
HCT: 41.2 % (ref 36.0–46.0)
Hemoglobin: 13.7 g/dL (ref 12.0–15.0)
LYMPHS ABS: 1.9 10*3/uL (ref 0.7–4.0)
Lymphocytes Relative: 30.7 % (ref 12.0–46.0)
MCHC: 33.2 g/dL (ref 30.0–36.0)
MCV: 84.1 fl (ref 78.0–100.0)
MONO ABS: 0.4 10*3/uL (ref 0.1–1.0)
MONOS PCT: 6 % (ref 3.0–12.0)
NEUTROS ABS: 3.7 10*3/uL (ref 1.4–7.7)
NEUTROS PCT: 61 % (ref 43.0–77.0)
PLATELETS: 193 10*3/uL (ref 150.0–400.0)
RBC: 4.9 Mil/uL (ref 3.87–5.11)
RDW: 15.4 % (ref 11.5–15.5)
WBC: 6.1 10*3/uL (ref 4.0–10.5)

## 2017-02-09 LAB — HEPATIC FUNCTION PANEL
ALK PHOS: 81 U/L (ref 39–117)
ALT: 20 U/L (ref 0–35)
AST: 16 U/L (ref 0–37)
Albumin: 4.2 g/dL (ref 3.5–5.2)
BILIRUBIN DIRECT: 0.1 mg/dL (ref 0.0–0.3)
TOTAL PROTEIN: 6.8 g/dL (ref 6.0–8.3)
Total Bilirubin: 0.5 mg/dL (ref 0.2–1.2)

## 2017-02-09 LAB — TSH: TSH: 0.55 u[IU]/mL (ref 0.35–4.50)

## 2017-02-09 LAB — HEMOGLOBIN A1C: Hgb A1c MFr Bld: 7.1 % — ABNORMAL HIGH (ref 4.6–6.5)

## 2017-02-10 ENCOUNTER — Ambulatory Visit (INDEPENDENT_AMBULATORY_CARE_PROVIDER_SITE_OTHER): Payer: 59 | Admitting: Internal Medicine

## 2017-02-10 ENCOUNTER — Encounter: Payer: Self-pay | Admitting: Pulmonary Disease

## 2017-02-10 ENCOUNTER — Encounter: Payer: Self-pay | Admitting: Internal Medicine

## 2017-02-10 VITALS — BP 126/74 | HR 86 | Temp 98.2°F | Ht 67.0 in | Wt 249.0 lb

## 2017-02-10 DIAGNOSIS — G8929 Other chronic pain: Secondary | ICD-10-CM | POA: Insufficient documentation

## 2017-02-10 DIAGNOSIS — E119 Type 2 diabetes mellitus without complications: Secondary | ICD-10-CM

## 2017-02-10 DIAGNOSIS — Z1159 Encounter for screening for other viral diseases: Secondary | ICD-10-CM | POA: Diagnosis not present

## 2017-02-10 DIAGNOSIS — Z0000 Encounter for general adult medical examination without abnormal findings: Secondary | ICD-10-CM | POA: Diagnosis not present

## 2017-02-10 MED ORDER — CELECOXIB 200 MG PO CAPS: 200.0000 mg | ORAL_CAPSULE | Freq: Two times a day (BID) | ORAL | 11 refills | Status: DC | PRN

## 2017-02-10 MED ORDER — VALSARTAN 160 MG PO TABS: 160.0000 mg | ORAL_TABLET | Freq: Every morning | ORAL | 0 refills | Status: DC

## 2017-02-10 MED ORDER — TRAMADOL HCL ER 200 MG PO TB24: 200.0000 mg | ORAL_TABLET | Freq: Every day | ORAL | 5 refills | Status: DC

## 2017-02-10 MED ORDER — ALBUTEROL SULFATE HFA 108 (90 BASE) MCG/ACT IN AERS: 2.0000 | INHALATION_SPRAY | Freq: Four times a day (QID) | RESPIRATORY_TRACT | 3 refills | Status: DC | PRN

## 2017-02-10 MED ORDER — METFORMIN HCL ER 500 MG PO TB24: 500.0000 mg | ORAL_TABLET | Freq: Every day | ORAL | 3 refills | Status: DC

## 2017-02-10 MED ORDER — CALCIUM GLUCONATE 500 MG PO TABS: 1.0000 | ORAL_TABLET | Freq: Every day | ORAL | 3 refills | Status: DC

## 2017-02-10 MED ORDER — MONTELUKAST SODIUM 10 MG PO TABS: ORAL_TABLET | ORAL | 3 refills | Status: DC

## 2017-02-10 MED ORDER — AMLODIPINE BESYLATE 10 MG PO TABS: 10.0000 mg | ORAL_TABLET | Freq: Every morning | ORAL | 0 refills | Status: DC

## 2017-02-10 NOTE — Progress Notes (Signed)
Pre visit review using our clinic review tool, if applicable. No additional management support is needed unless otherwise documented below in the visit note. 

## 2017-02-10 NOTE — Progress Notes (Signed)
Subjective:    Patient ID: Tanya [name], female    DOB: 01/15/1956, 61 y.o.   MRN: [id number]  HPI    Here for wellness and f/u;  Overall doing ok;  Pt denies Chest pain, worsening SOB, DOE, wheezing, orthopnea, PND, worsening LE edema, palpitations, dizziness or syncope.  Pt denies neurological change such as new headache, facial or extremity weakness.  Pt denies polydipsia, polyuria, or low sugar symptoms. Pt states overall good compliance with treatment and medications, good tolerability, and has been trying to follow appropriate diet.  Pt denies worsening depressive symptoms, suicidal ideation or panic. No fever, night sweats, wt loss, loss of appetite, or other constitutional symptoms.  Pt states good ability with ADL's, has low fall risk, home safety reviewed and adequate, no other significant changes in hearing or vision, not active with exercise due to recent right hip replacement, and worsening right > left hip pain and may eventually need surgury for those.  No significant LBP. Sched for mammogram on mar 28., with f/u pulm tomorrow.   Past Medical History:  Diagnosis Date  . Allergic rhinitis   . Blepharospasm 10/09/2010  . Cancer (HCC)    Skin  . Hip pain   . History of melanoma excision    MULTIPLE SITES  . Hypertension   . Mild intermittent asthma   . OA (osteoarthritis)   . PMB (postmenopausal bleeding)   . PONV (postoperative nausea and vomiting)    did not experience after D&C, request to use same medicaitons from that procedure  . Precancerous changes of the cervix   . Sleep apnea    tested in home around 2 yrs ago  . Varicose vein    wears compression stocking   Past Surgical History:  Procedure Laterality Date  . ABDOMINAL HYSTERECTOMY    . COLONOSCOPY    . DILATION AND CURETTAGE OF UTERUS    . ESOPHAGOGASTRODUODENOSCOPY  10/14/2004  . HAMMER TOE SURGERY    . HYSTEROSCOPY W/D&C N/A 04/07/2016   Procedure: DILATATION AND CURETTAGE /HYSTEROSCOPY WITH MYOSURE  POLYPECTOMY;  Surgeon: Cheri Fowler, MD;  Location: [facility];  Service: Gynecology;  Laterality: N/A;  . JOINT REPLACEMENT    . KNEE ARTHROSCOPY Bilateral left 08-01-2005/  right  . LAPAROSCOPIC HYSTERECTOMY N/A 06/02/2016   Procedure: HYSTERECTOMY TOTAL LAPAROSCOPIC;  Surgeon: Cheri Fowler, MD;  Location: [facility];  Service: Gynecology;  Laterality: N/A;  . LAPAROSCOPIC SALPINGO OOPHERECTOMY Bilateral 06/02/2016   Procedure: LAPAROSCOPIC SALPINGO OOPHORECTOMY;  Surgeon: Cheri Fowler, MD;  Location: [facility];  Service: Gynecology;  Laterality: Bilateral;  . RE-DO ROTATOR CUFF REPAIR/  SAD/  ACROMIOPLASTY Right 01-04-2009  . SHOULDER ARTHROSCOPY W/ ROTATOR CUFF REPAIR Right   . tailor bunion removal    . TONSILLECTOMY    . TOTAL HIP ARTHROPLASTY Right 09/12/2016   Procedure: RIGHT TOTAL HIP ARTHROPLASTY ANTERIOR APPROACH;  Surgeon: Rod Can, MD;  Location: [facility];  Service: Orthopedics;  Laterality: Right;  . TOTAL HIP ARTHROPLASTY Right 10/01/2016   Procedure: IRRIGATION AND DEBRIDEMENT RIGHT HIP;  Surgeon: Rod Can, MD;  Location: [facility];  Service: Orthopedics;  Laterality: Right;  . TOTAL KNEE ARTHROPLASTY Left 12/29/2013   Procedure: LEFT TOTAL KNEE ARTHROPLASTY;  Surgeon: Johnn Hai, MD;  Location: [facility];  Service: Orthopedics;  Laterality: Left;    reports that she quit smoking about 29 years ago. Her smoking use included Cigarettes. She has a 20.00 pack-year smoking history. She has never used smokeless tobacco. She reports that  she drinks about 1.2 oz of alcohol per week . She reports that she does not use drugs. family history includes Asthma in her other; Diabetes in her other; Esophageal cancer in her father; Melanoma in her sister; Ovarian cancer in her other. Allergies  Allergen Reactions  . Ace Inhibitors Hives  . Amoxicillin Hives  . Other Hives    Nucafed  . Penicillins Hives    Has patient had a PCN reaction causing immediate rash,  facial/tongue/throat swelling, SOB or lightheadedness with hypotension: Yes Has patient had a PCN reaction causing severe rash involving mucus membranes or skin necrosis: No Has patient had a PCN reaction that required hospitalization No Has patient had a PCN reaction occurring within the last 10 years: No If all of the above answers are "NO", then may proceed with Cephalosporin use.   . Pseudoephedrine Hives  . Pseudoephedrine-Codeine Hives   Current Outpatient Prescriptions on File Prior to Visit  Medication Sig Dispense Refill  . aspirin 81 MG tablet Take 1 tablet (81 mg total) by mouth 2 (two) times daily after a meal. 60 tablet 1  . calcium acetate (PHOSLO) 667 MG capsule TAKE 1 CAPSULE (667 MG TOTAL) BY MOUTH AT BEDTIME. 90 capsule 0  . cetirizine (ZYRTEC) 10 MG tablet TAKE 1 TABLET (10 MG TOTAL) BY MOUTH DAILY. (Patient taking differently: Take 10 mg by mouth daily. TAKE 1 TABLET (10 MG TOTAL) BY MOUTH DAILY.) 90 tablet 3  . docusate sodium (COLACE) 100 MG capsule Take 1 capsule (100 mg total) by mouth 2 (two) times daily. 60 capsule 3  . Glucosamine-Chondroit-Vit C-Mn (GLUCOSAMINE 1500 COMPLEX PO) Take 1 tablet by mouth 2 (two) times daily.     Marland Kitchen HYDROcodone-acetaminophen (NORCO) 5-325 MG tablet Take 1-2 tablets by mouth every 4 (four) hours as needed for moderate pain. 90 tablet 0  . methocarbamol (ROBAXIN) 500 MG tablet Take 500 mg by mouth 4 (four) times daily.    . Multiple Vitamin (MULTIVITAMIN WITH MINERALS) TABS tablet Take 1 tablet by mouth daily.    . ondansetron (ZOFRAN) 4 MG tablet Take 1 tablet (4 mg total) by mouth every 8 (eight) hours as needed for nausea or vomiting. 20 tablet 0  . senna (SENOKOT) 8.6 MG TABS tablet Take 2 tablets (17.2 mg total) by mouth at bedtime. 60 each 3   No current facility-administered medications on file prior to visit.    Review of Systems Constitutional: Negative for increased diaphoresis, or other activity, appetite or siginficant weight  change other than noted HENT: Negative for worsening hearing loss, ear pain, facial swelling, mouth sores and neck stiffness.   Eyes: Negative for other worsening pain, redness or visual disturbance.  Respiratory: Negative for choking or stridor Cardiovascular: Negative for other chest pain and palpitations.  Gastrointestinal: Negative for worsening diarrhea, blood in stool, or abdominal distention Genitourinary: Negative for hematuria, flank pain or change in urine volume.  Musculoskeletal: Negative for myalgias or other joint complaints.  Skin: Negative for other color change and wound or drainage.  Neurological: Negative for syncope and numbness. other than noted Hematological: Negative for adenopathy. or other swelling Psychiatric/Behavioral: Negative for hallucinations, SI, self-injury, decreased concentration or other worsening agitation.  All other system neg per pt    Objective:   Physical Exam BP 126/74   Pulse 86   Temp 98.2 F (36.8 C) (Oral)   Ht 5\' 7"  (1.702 m)   Wt 249 lb (112.9 kg)   LMP 09/13/2013   SpO2 97%  BMI 39.00 kg/m  VS noted,  Constitutional: Pt is oriented to person, place, and time. Appears well-developed and well-nourished, in no significant distress Head: Normocephalic and atraumatic  Eyes: Conjunctivae and EOM are normal. Pupils are equal, round, and reactive to light Right Ear: External ear normal.  Left Ear: External ear normal Nose: Nose normal.  Mouth/Throat: Oropharynx is clear and moist  Neck: Normal range of motion. Neck supple. No JVD present. No tracheal deviation present or significant neck LA or mass Cardiovascular: Normal rate, regular rhythm, normal heart sounds and intact distal pulses.   Pulmonary/Chest: Effort normal and breath sounds without rales or wheezing  Abdominal: Soft. Bowel sounds are normal. NT. No HSM  Musculoskeletal: Normal range of motion. Exhibits no edema Lymphadenopathy: Has no cervical adenopathy.  Neurological:  Pt is alert and oriented to person, place, and time. Pt has normal reflexes. No cranial nerve deficit. Motor grossly intact Skin: Skin is warm and dry. No rash noted or new ulcers Psychiatric:  Has normal mood and affect. Behavior is normal.  No other exam findings    Assessment & Plan:

## 2017-02-10 NOTE — Patient Instructions (Signed)
Please take all new medication as prescribed - the metformin ER 500 mg per day  Please continue all other medications as before, and refills have been done if requested, including the tramadol  Please have the pharmacy call with any other refills you may need.  Please continue your efforts at being more active, low cholesterol diet, and weight control.  You are otherwise up to date with prevention measures today.  Please keep your appointments with your specialists as you may have planned  Please return in 6 months, or sooner if needed, with Lab testing done 3-5 days before

## 2017-02-10 NOTE — Assessment & Plan Note (Signed)
New onset related to recent reduced activity level, for metformin ER 500 qd start,  to f/u any worsening symptoms or concerns

## 2017-02-10 NOTE — Assessment & Plan Note (Signed)
Stable, cont f/u ortho for post op right hip, also right knee and left hip

## 2017-02-10 NOTE — Assessment & Plan Note (Signed)

## 2017-02-11 ENCOUNTER — Encounter: Payer: Self-pay | Admitting: Pulmonary Disease

## 2017-02-11 ENCOUNTER — Ambulatory Visit (INDEPENDENT_AMBULATORY_CARE_PROVIDER_SITE_OTHER): Payer: 59 | Admitting: Pulmonary Disease

## 2017-02-11 DIAGNOSIS — J4520 Mild intermittent asthma, uncomplicated: Secondary | ICD-10-CM

## 2017-02-11 DIAGNOSIS — G4733 Obstructive sleep apnea (adult) (pediatric): Secondary | ICD-10-CM | POA: Diagnosis not present

## 2017-02-11 NOTE — Patient Instructions (Signed)
You are on auto CPAP settings and CPAP seems to be working well Asthma is well controlled

## 2017-02-11 NOTE — Assessment & Plan Note (Signed)
Auto CPAP seems to be working well with average pressure of 13 cm. CPAP supplies will be renewed for a year.  Weight loss encouraged, compliance with goal of at least 4-6 hrs every night is the expectation. Advised against medications with sedative side effects Cautioned against driving when sleepy - understanding that sleepiness will vary on a day to day basis

## 2017-02-11 NOTE — Assessment & Plan Note (Signed)
Refills on albuterol and Singulair as needed

## 2017-02-11 NOTE — Progress Notes (Signed)
   Subjective:    Patient ID: Tanya [name], female    DOB: 02/13/1956, 61 y.o.   MRN: [id number]  HPI  61/F ex smoker for fu of obstructive sleep apnea & mild int asthma .  She moved from [place] to [place].  PSG in [place] in 1997/98 @ [facility] (not available) >> severe obstructive sleep apnea & set up on nasal CPAP - supplier is [employer] [facility].  Asthma was diagnosed as a child , she grew out of this as an adult & is now back to using pro-air , 2 cannisters/ year.   She uses a mouth guard for bruxism, c/o dry mouth in am   02/11/2017  Chief Complaint  Patient presents with  . Follow-up    21yr f/u for OSA. Breathing has been ok since last visit.     Annual FU  Her current CPAP machine was obtained in 2013. She had significant improvement in her daytime somnolence and fatigue. No problems with mask or pressure. Reports good compliance with this . Download confirms excellent compliance more than 8 hours per night with good control of events on auto settings with average pressure of 13 cm and no significant leak  From an asthma standpoint, she is compliant with Singulair, does not need albuterol but seldom She denies nocturnal wheezing   Review of Systems neg for any significant sore throat, dysphagia, itching, sneezing, nasal congestion or excess/ purulent secretions, fever, chills, sweats, unintended wt loss, pleuritic or exertional cp, hempoptysis, orthopnea pnd or change in chronic leg swelling. Also denies presyncope, palpitations, heartburn, abdominal pain, nausea, vomiting, diarrhea or change in bowel or urinary habits, dysuria,hematuria, rash, arthralgias, visual complaints, headache, numbness weakness or ataxia.     Objective:   Physical Exam   Gen. Pleasant, obese, in no distress ENT - no lesions, no post nasal drip Neck: No JVD, no thyromegaly, no carotid bruits Lungs: no use of accessory muscles, no dullness to percussion, decreased without rales or  rhonchi  Cardiovascular: Rhythm regular, heart sounds  normal, no murmurs or gallops, no peripheral edema Musculoskeletal: No deformities, no cyanosis or clubbing , no tremors        Assessment & Plan:

## 2017-02-12 ENCOUNTER — Other Ambulatory Visit: Payer: Self-pay | Admitting: Internal Medicine

## 2017-02-12 NOTE — Telephone Encounter (Signed)
Too soon for tramadol since last rx was just 2 days ago

## 2017-02-16 ENCOUNTER — Other Ambulatory Visit: Payer: Self-pay

## 2017-02-16 NOTE — Telephone Encounter (Signed)
Per pharmacy medication is on backorder/unavailable. Would like an alternative.

## 2017-02-17 ENCOUNTER — Telehealth: Payer: Self-pay | Admitting: Internal Medicine

## 2017-02-17 MED ORDER — CALCIUM GLUCONATE 500 MG PO TABS: 1.0000 | ORAL_TABLET | Freq: Every day | ORAL | 3 refills | Status: DC

## 2017-02-17 NOTE — Telephone Encounter (Signed)
Patient states that Dr. Jenny Reichmann prescribed metformin for her.  Patient states she is having intense diarrhea.  She has stopped the medication.  Would like to know what can take in place of this.

## 2017-02-17 NOTE — Telephone Encounter (Signed)
Calcium supp done erx

## 2017-02-18 MED ORDER — GLIPIZIDE ER 2.5 MG PO TB24: 2.5000 mg | ORAL_TABLET | Freq: Every day | ORAL | 3 refills | Status: DC

## 2017-02-18 NOTE — Telephone Encounter (Signed)
Patient was informed and expressed understanding. She will try the glipizide and see how that works for her.

## 2017-02-18 NOTE — Telephone Encounter (Signed)
Ok to d/c the metfomrin and I have place this on the allergy/intolerance list  Ok to change to glipizide 2.5 ER qd

## 2017-02-24 ENCOUNTER — Other Ambulatory Visit: Payer: Self-pay | Admitting: Internal Medicine

## 2017-02-25 ENCOUNTER — Other Ambulatory Visit: Payer: Self-pay | Admitting: Internal Medicine

## 2017-03-17 ENCOUNTER — Other Ambulatory Visit: Payer: Self-pay | Admitting: Internal Medicine

## 2017-03-24 ENCOUNTER — Other Ambulatory Visit: Payer: Self-pay | Admitting: Internal Medicine

## 2017-06-30 ENCOUNTER — Other Ambulatory Visit: Payer: Self-pay | Admitting: Internal Medicine

## 2017-06-30 MED ORDER — IRBESARTAN 150 MG PO TABS: 150.0000 mg | ORAL_TABLET | Freq: Every day | ORAL | 3 refills | Status: DC

## 2017-06-30 NOTE — Telephone Encounter (Signed)
diovan changed to avapro 150 qd

## 2017-08-06 ENCOUNTER — Other Ambulatory Visit: Payer: Self-pay | Admitting: Internal Medicine

## 2017-08-06 NOTE — Telephone Encounter (Signed)
Faxed

## 2017-08-06 NOTE — Telephone Encounter (Signed)
Done hardcopy to Shirron  

## 2017-08-12 ENCOUNTER — Encounter: Payer: Self-pay | Admitting: Internal Medicine

## 2017-08-12 ENCOUNTER — Ambulatory Visit (INDEPENDENT_AMBULATORY_CARE_PROVIDER_SITE_OTHER): Payer: 59 | Admitting: Internal Medicine

## 2017-08-12 VITALS — BP 144/90 | HR 94 | Temp 98.3°F | Wt 263.0 lb

## 2017-08-12 DIAGNOSIS — E119 Type 2 diabetes mellitus without complications: Secondary | ICD-10-CM | POA: Diagnosis not present

## 2017-08-12 DIAGNOSIS — I10 Essential (primary) hypertension: Secondary | ICD-10-CM

## 2017-08-12 DIAGNOSIS — R42 Dizziness and giddiness: Secondary | ICD-10-CM | POA: Diagnosis not present

## 2017-08-12 DIAGNOSIS — J309 Allergic rhinitis, unspecified: Secondary | ICD-10-CM

## 2017-08-12 LAB — POCT GLYCOSYLATED HEMOGLOBIN (HGB A1C): Hemoglobin A1C: 5.9

## 2017-08-12 MED ORDER — MECLIZINE HCL 12.5 MG PO TABS: 12.5000 mg | ORAL_TABLET | Freq: Three times a day (TID) | ORAL | 1 refills | Status: DC | PRN

## 2017-08-12 MED ORDER — PREDNISONE 10 MG PO TABS: ORAL_TABLET | ORAL | 0 refills | Status: DC

## 2017-08-12 NOTE — Assessment & Plan Note (Signed)
Mild to mod seasonal flare, for predpac asd, to f/u any worsening symptoms or concerns

## 2017-08-12 NOTE — Progress Notes (Signed)
Subjective:    Patient ID: Tanya [name], female    DOB: 1955/12/28, 61 y.o.   MRN: [id number]  HPI  Here to f/u, Does have several wks ongoing nasal allergy symptoms with clearish congestion, itch and sneezing, without fever, pain, ST, cough, swelling or wheezing, but has had recurrent dizziness like you might get with a brief lightheadedness at the top of a quick elevator ride, and a few times with room spinning.   Pt denies fever, wt loss, night sweats, loss of appetite, or other constitutional symptoms  Pt denies chest pain, increased sob or doe, wheezing, orthopnea, PND, increased LE swelling, palpitations, dizziness or syncope. Pt denies new neurological symptoms such as new headache, or facial or extremity weakness or numbness  Is concerned bc her sister was found to have a brain tumor years after tx for siezure d/o.   Pt denies polydipsia, polyuria, or low sugar symptoms  Pt states overall good compliance with meds, trying to follow lower cholesterol, diabetic diet, wt overall stable but little exercise however.   Has cont'd to gain wt.  Wondering about contrave rx b/c of a commercial she saw on TV last night Wt Readings from Last 3 Encounters:  08/12/17 263 lb (119.3 kg)  02/11/17 250 lb (113.4 kg)  02/10/17 249 lb (112.9 kg)   Past Medical History:  Diagnosis Date  . Allergic rhinitis   . Blepharospasm 10/09/2010  . Cancer (HCC)    Skin  . Hip pain   . History of melanoma excision    MULTIPLE SITES  . Hypertension   . Mild intermittent asthma   . OA (osteoarthritis)   . PMB (postmenopausal bleeding)   . PONV (postoperative nausea and vomiting)    did not experience after D&C, request to use same medicaitons from that procedure  . Precancerous changes of the cervix   . Sleep apnea    tested in home around 2 yrs ago  . Varicose vein    wears compression stocking   Past Surgical History:  Procedure Laterality Date  . ABDOMINAL HYSTERECTOMY    . COLONOSCOPY    . DILATION  AND CURETTAGE OF UTERUS    . ESOPHAGOGASTRODUODENOSCOPY  10/14/2004  . HAMMER TOE SURGERY    . HYSTEROSCOPY W/D&C N/A 04/07/2016   Procedure: DILATATION AND CURETTAGE /HYSTEROSCOPY WITH MYOSURE POLYPECTOMY;  Surgeon: Cheri Fowler, MD;  Location: [facility];  Service: Gynecology;  Laterality: N/A;  . JOINT REPLACEMENT    . KNEE ARTHROSCOPY Bilateral left 08-01-2005/  right  . LAPAROSCOPIC HYSTERECTOMY N/A 06/02/2016   Procedure: HYSTERECTOMY TOTAL LAPAROSCOPIC;  Surgeon: Cheri Fowler, MD;  Location: [facility];  Service: Gynecology;  Laterality: N/A;  . LAPAROSCOPIC SALPINGO OOPHERECTOMY Bilateral 06/02/2016   Procedure: LAPAROSCOPIC SALPINGO OOPHORECTOMY;  Surgeon: Cheri Fowler, MD;  Location: [facility];  Service: Gynecology;  Laterality: Bilateral;  . RE-DO ROTATOR CUFF REPAIR/  SAD/  ACROMIOPLASTY Right 01-04-2009  . SHOULDER ARTHROSCOPY W/ ROTATOR CUFF REPAIR Right   . tailor bunion removal    . TONSILLECTOMY    . TOTAL HIP ARTHROPLASTY Right 09/12/2016   Procedure: RIGHT TOTAL HIP ARTHROPLASTY ANTERIOR APPROACH;  Surgeon: Rod Can, MD;  Location: [facility];  Service: Orthopedics;  Laterality: Right;  . TOTAL HIP ARTHROPLASTY Right 10/01/2016   Procedure: IRRIGATION AND DEBRIDEMENT RIGHT HIP;  Surgeon: Rod Can, MD;  Location: [facility];  Service: Orthopedics;  Laterality: Right;  . TOTAL KNEE ARTHROPLASTY Left 12/29/2013   Procedure: LEFT TOTAL KNEE ARTHROPLASTY;  Surgeon: Dellis Filbert  Windy Kalata, MD;  Location: [facility];  Service: Orthopedics;  Laterality: Left;    reports that she quit smoking about 29 years ago. Her smoking use included Cigarettes. She has a 20.00 pack-year smoking history. She has never used smokeless tobacco. She reports that she drinks about 1.2 oz of alcohol per week . She reports that she does not use drugs. family history includes Asthma in her other; Diabetes in her other; Esophageal cancer in her father; Melanoma in her sister; Ovarian cancer in her  other. Allergies  Allergen Reactions  . Ace Inhibitors Hives  . Amoxicillin Hives  . Other Hives    Nucafed  . Penicillins Hives    Has patient had a PCN reaction causing immediate rash, facial/tongue/throat swelling, SOB or lightheadedness with hypotension: Yes Has patient had a PCN reaction causing severe rash involving mucus membranes or skin necrosis: No Has patient had a PCN reaction that required hospitalization No Has patient had a PCN reaction occurring within the last 10 years: No If all of the above answers are "NO", then may proceed with Cephalosporin use.   . Metformin And Related Other (See Comments)    GI upset  . Pseudoephedrine Hives  . Pseudoephedrine-Codeine Hives   Current Outpatient Prescriptions on File Prior to Visit  Medication Sig Dispense Refill  . albuterol (PROVENTIL HFA;VENTOLIN HFA) 108 (90 Base) MCG/ACT inhaler Inhale 2 puffs into the lungs every 6 (six) hours as needed for wheezing or shortness of breath. 1 Inhaler 3  . amLODipine (NORVASC) 10 MG tablet Take 1 tablet (10 mg total) by mouth every morning. 90 tablet 3  . aspirin 81 MG tablet Take 1 tablet (81 mg total) by mouth 2 (two) times daily after a meal. 60 tablet 1  . calcium gluconate 500 MG tablet Take 1 tablet (500 mg total) by mouth daily. 90 tablet 3  . celecoxib (CELEBREX) 200 MG capsule Take 1 capsule (200 mg total) by mouth 2 (two) times daily as needed. Must keep 02/10/17 appt for future refills 60 capsule 11  . cetirizine (ZYRTEC) 10 MG tablet TAKE 1 TABLET (10 MG TOTAL) BY MOUTH DAILY. (Patient taking differently: Take 10 mg by mouth daily. TAKE 1 TABLET (10 MG TOTAL) BY MOUTH DAILY.) 90 tablet 3  . docusate sodium (COLACE) 100 MG capsule Take 1 capsule (100 mg total) by mouth 2 (two) times daily. 60 capsule 3  . glipiZIDE (GLUCOTROL XL) 2.5 MG 24 hr tablet Take 1 tablet (2.5 mg total) by mouth daily with breakfast. 90 tablet 3  . Glucosamine-Chondroit-Vit C-Mn (GLUCOSAMINE 1500 COMPLEX PO)  Take 1 tablet by mouth 2 (two) times daily.     Marland Kitchen HYDROcodone-acetaminophen (NORCO) 5-325 MG tablet Take 1-2 tablets by mouth every 4 (four) hours as needed for moderate pain. 90 tablet 0  . irbesartan (AVAPRO) 150 MG tablet Take 1 tablet (150 mg total) by mouth daily. 90 tablet 3  . metFORMIN (GLUCOPHAGE-XR) 500 MG 24 hr tablet Take 1 tablet (500 mg total) by mouth daily with breakfast. 90 tablet 3  . methocarbamol (ROBAXIN) 500 MG tablet Take 500 mg by mouth 4 (four) times daily.    . montelukast (SINGULAIR) 10 MG tablet TAKE 1 TABLET (10 MG TOTAL) BY MOUTH EVERY MORNING. 90 tablet 3  . montelukast (SINGULAIR) 10 MG tablet Take 1 tablet (10 mg total) by mouth daily. 90 tablet 3  . Multiple Vitamin (MULTIVITAMIN WITH MINERALS) TABS tablet Take 1 tablet by mouth daily.    Marland Kitchen senna (SENOKOT)  8.6 MG TABS tablet Take 2 tablets (17.2 mg total) by mouth at bedtime. 60 each 3  . traMADol (ULTRAM-ER) 200 MG 24 hr tablet TAKE ONE TABLET BY MOUTH DAILY 30 tablet 5   No current facility-administered medications on file prior to visit.    Review of Systems  Constitutional: Negative for other unusual diaphoresis or sweats HENT: Negative for ear discharge or swelling Eyes: Negative for other worsening visual disturbances Respiratory: Negative for stridor or other swelling  Gastrointestinal: Negative for worsening distension or other blood Genitourinary: Negative for retention or other urinary change Musculoskeletal: Negative for other MSK pain or swelling Skin: Negative for color change or other new lesions Neurological: Negative for worsening tremors and other numbness  Psychiatric/Behavioral: Negative for worsening agitation or other fatigue All other system neg per pt    Objective:   Physical Exam BP (!) 144/90   Pulse 94   Temp 98.3 F (36.8 C) (Oral)   Wt 263 lb (119.3 kg)   LMP 09/13/2013   SpO2 98%   BMI 41.19 kg/m  VS noted,  Constitutional: Pt appears in NAD HENT: Head: NCAT.   Right Ear: External ear normal.  Left Ear: External ear normal.  Eyes: . Pupils are equal, round, and reactive to light. Conjunctivae and EOM are normal Nose: without d/c or deformity Bilat tm's with mild erythema.  Max sinus areas non tender.  Pharynx with mild erythema, no exudate Neck: Neck supple. Gross normal ROM Cardiovascular: Normal rate and regular rhythm.   Pulmonary/Chest: Effort normal and breath sounds without rales or wheezing.  Neurological: Pt is alert. At baseline orientation, motor grossly intact Skin: Skin is warm. No rashes, other new lesions, no LE edema Psychiatric: Pt behavior is normal without agitation  No other exam findings   POCT - A1c  - 5.9    Assessment & Plan:

## 2017-08-12 NOTE — Patient Instructions (Addendum)
Please take all new medication as prescribed - the prednisone, and the meclizine to try if the dizziness worsens  Please continue all other medications as before, and refills have been done if requested.  Please have the pharmacy call with any other refills you may need.  Please continue your efforts at being more active, low cholesterol diet, and weight control.  Please keep your appointments with your specialists as you may have planned  Please return in 6 months, or sooner if needed, with Lab testing done 3-5 days before

## 2017-08-12 NOTE — Assessment & Plan Note (Signed)
Etiology unclear, neuro exam no change, I suspect likely peripheral in nature, for meclizine prn, tx for allergies as above, consider MRI but pt declines for now

## 2017-08-12 NOTE — Assessment & Plan Note (Signed)
Mild elev today, likely reactive, o/w stable overall by history and exam, recent data reviewed with pt, and pt to continue medical treatment as before,  to f/u any worsening symptoms or concerns BP Readings from Last 3 Encounters:  08/12/17 (!) 144/90  02/11/17 110/70  02/10/17 126/74

## 2017-08-12 NOTE — Assessment & Plan Note (Signed)
stable overall by history and exam, recent data reviewed with pt, and pt to continue medical treatment as before,  to f/u any worsening symptoms or concerns  

## 2017-08-13 ENCOUNTER — Ambulatory Visit: Payer: Managed Care, Other (non HMO) | Admitting: Internal Medicine

## 2017-09-30 ENCOUNTER — Encounter: Payer: Self-pay | Admitting: Internal Medicine

## 2017-12-07 ENCOUNTER — Other Ambulatory Visit: Payer: Self-pay | Admitting: Internal Medicine

## 2018-01-06 ENCOUNTER — Other Ambulatory Visit (INDEPENDENT_AMBULATORY_CARE_PROVIDER_SITE_OTHER): Payer: 59

## 2018-01-06 DIAGNOSIS — Z0000 Encounter for general adult medical examination without abnormal findings: Secondary | ICD-10-CM

## 2018-01-06 DIAGNOSIS — E119 Type 2 diabetes mellitus without complications: Secondary | ICD-10-CM | POA: Diagnosis not present

## 2018-01-06 DIAGNOSIS — Z1159 Encounter for screening for other viral diseases: Secondary | ICD-10-CM

## 2018-01-06 LAB — URINALYSIS, ROUTINE W REFLEX MICROSCOPIC
Bilirubin Urine: NEGATIVE
Hgb urine dipstick: NEGATIVE
Ketones, ur: NEGATIVE
Leukocytes, UA: NEGATIVE
Nitrite: NEGATIVE
Specific Gravity, Urine: 1.02
Total Protein, Urine: NEGATIVE
Urine Glucose: NEGATIVE
Urobilinogen, UA: 0.2
pH: 6.5 (ref 5.0–8.0)

## 2018-01-06 LAB — HEPATIC FUNCTION PANEL
ALT: 29 U/L (ref 0–35)
AST: 22 U/L (ref 0–37)
Albumin: 4 g/dL (ref 3.5–5.2)
Alkaline Phosphatase: 67 U/L (ref 39–117)
BILIRUBIN DIRECT: 0.1 mg/dL (ref 0.0–0.3)
BILIRUBIN TOTAL: 0.7 mg/dL (ref 0.2–1.2)
TOTAL PROTEIN: 6.9 g/dL (ref 6.0–8.3)

## 2018-01-06 LAB — CBC WITH DIFFERENTIAL/PLATELET
Basophils Absolute: 0 10*3/uL (ref 0.0–0.1)
Basophils Relative: 0.7 % (ref 0.0–3.0)
Eosinophils Absolute: 0.1 10*3/uL (ref 0.0–0.7)
Eosinophils Relative: 2 % (ref 0.0–5.0)
HCT: 41.2 % (ref 36.0–46.0)
Hemoglobin: 14.1 g/dL (ref 12.0–15.0)
Lymphocytes Relative: 35.2 % (ref 12.0–46.0)
Lymphs Abs: 1.9 10*3/uL (ref 0.7–4.0)
MCHC: 34.3 g/dL (ref 30.0–36.0)
MCV: 88.8 fl (ref 78.0–100.0)
Monocytes Absolute: 0.3 10*3/uL (ref 0.1–1.0)
Monocytes Relative: 5.8 % (ref 3.0–12.0)
Neutro Abs: 3 10*3/uL (ref 1.4–7.7)
Neutrophils Relative %: 56.3 % (ref 43.0–77.0)
Platelets: 202 10*3/uL (ref 150.0–400.0)
RBC: 4.64 Mil/uL (ref 3.87–5.11)
RDW: 13.6 % (ref 11.5–15.5)
WBC: 5.3 10*3/uL (ref 4.0–10.5)

## 2018-01-06 LAB — LIPID PANEL
Cholesterol: 130 mg/dL (ref 0–200)
HDL: 47.9 mg/dL
LDL Cholesterol: 68 mg/dL (ref 0–99)
NonHDL: 82.3
Total CHOL/HDL Ratio: 3
Triglycerides: 72 mg/dL (ref 0.0–149.0)
VLDL: 14.4 mg/dL (ref 0.0–40.0)

## 2018-01-06 LAB — BASIC METABOLIC PANEL WITH GFR
BUN: 15 mg/dL (ref 6–23)
CO2: 26 meq/L (ref 19–32)
Calcium: 9.3 mg/dL (ref 8.4–10.5)
Chloride: 106 meq/L (ref 96–112)
Creatinine, Ser: 0.68 mg/dL (ref 0.40–1.20)
GFR: 93.84 mL/min
Glucose, Bld: 112 mg/dL — ABNORMAL HIGH (ref 70–99)
Potassium: 4 meq/L (ref 3.5–5.1)
Sodium: 140 meq/L (ref 135–145)

## 2018-01-06 LAB — HEMOGLOBIN A1C: Hgb A1c MFr Bld: 6.2 % (ref 4.6–6.5)

## 2018-01-06 LAB — TSH: TSH: 0.84 u[IU]/mL (ref 0.35–4.50)

## 2018-01-07 LAB — HEPATITIS C ANTIBODY
Hepatitis C Ab: NONREACTIVE
SIGNAL TO CUT-OFF: 0.01 (ref <1.00)

## 2018-01-07 LAB — HIV ANTIBODY (ROUTINE TESTING W REFLEX): HIV 1&2 Ab, 4th Generation: NONREACTIVE

## 2018-01-10 ENCOUNTER — Encounter: Payer: Self-pay | Admitting: Adult Health

## 2018-01-11 ENCOUNTER — Encounter: Payer: Self-pay | Admitting: Internal Medicine

## 2018-01-11 ENCOUNTER — Ambulatory Visit (INDEPENDENT_AMBULATORY_CARE_PROVIDER_SITE_OTHER): Payer: 59 | Admitting: Adult Health

## 2018-01-11 ENCOUNTER — Encounter: Payer: Self-pay | Admitting: Adult Health

## 2018-01-11 ENCOUNTER — Ambulatory Visit (INDEPENDENT_AMBULATORY_CARE_PROVIDER_SITE_OTHER): Payer: 59 | Admitting: Internal Medicine

## 2018-01-11 VITALS — BP 132/88 | HR 82 | Temp 98.0°F | Ht 67.0 in | Wt 253.0 lb

## 2018-01-11 DIAGNOSIS — E119 Type 2 diabetes mellitus without complications: Secondary | ICD-10-CM

## 2018-01-11 DIAGNOSIS — Z0000 Encounter for general adult medical examination without abnormal findings: Secondary | ICD-10-CM

## 2018-01-11 DIAGNOSIS — J309 Allergic rhinitis, unspecified: Secondary | ICD-10-CM | POA: Diagnosis not present

## 2018-01-11 DIAGNOSIS — Z23 Encounter for immunization: Secondary | ICD-10-CM | POA: Diagnosis not present

## 2018-01-11 DIAGNOSIS — J4520 Mild intermittent asthma, uncomplicated: Secondary | ICD-10-CM

## 2018-01-11 DIAGNOSIS — G4733 Obstructive sleep apnea (adult) (pediatric): Secondary | ICD-10-CM

## 2018-01-11 MED ORDER — AMLODIPINE BESYLATE 10 MG PO TABS: 10.0000 mg | ORAL_TABLET | Freq: Every morning | ORAL | 3 refills | Status: DC

## 2018-01-11 MED ORDER — MONTELUKAST SODIUM 10 MG PO TABS: 10.0000 mg | ORAL_TABLET | Freq: Every day | ORAL | 3 refills | Status: DC

## 2018-01-11 MED ORDER — CELECOXIB 200 MG PO CAPS: ORAL_CAPSULE | ORAL | 5 refills | Status: DC

## 2018-01-11 MED ORDER — IRBESARTAN 150 MG PO TABS: 150.0000 mg | ORAL_TABLET | Freq: Every day | ORAL | 3 refills | Status: DC

## 2018-01-11 MED ORDER — ALBUTEROL SULFATE HFA 108 (90 BASE) MCG/ACT IN AERS: 2.0000 | INHALATION_SPRAY | Freq: Four times a day (QID) | RESPIRATORY_TRACT | 3 refills | Status: DC | PRN

## 2018-01-11 MED ORDER — GLIPIZIDE ER 2.5 MG PO TB24: 2.5000 mg | ORAL_TABLET | Freq: Every day | ORAL | 3 refills | Status: DC

## 2018-01-11 MED ORDER — ZOSTER VAC RECOMB ADJUVANTED 50 MCG/0.5ML IM SUSR: 0.5000 mL | Freq: Once | INTRAMUSCULAR | 1 refills | Status: AC

## 2018-01-11 NOTE — Assessment & Plan Note (Signed)
Lab Results  Component Value Date   HGBA1C 6.2 01/06/2018  stable overall by history and exam, recent data reviewed with pt, and pt to continue medical treatment as before,  to f/u any worsening symptoms or concerns'

## 2018-01-11 NOTE — Assessment & Plan Note (Signed)
Controlled on CPAP   Plan  Patient Instructions  Continue on CPAP at bedtime Work on healthy weight Do not drive a sleepy Continue on Singulair daily. Follow-up in 1 year with Dr. Elsworth Soho and as needed

## 2018-01-11 NOTE — Progress Notes (Signed)
@Patient  ID: Tanya [name], female    DOB: 12-07-1955, 62 y.o.   MRN: [id number]  Chief Complaint  Patient presents with  . Follow-up    OSA     Referring provider: Biagio Borg, MD  HPI: 62 year old female former smoker followed for obstructive sleep apnea and mild intermittent asthma  01/11/2018 Follow up ; OSA and Asthma  Patient presents for a one year follow-up.  Patient has known sleep apnea.  She is on CPAP at bedtime.  Says she is doing well.  She feels rested with no significant daytime sleepiness.  Download shows excellent compliance.  Patient is on CPAP AutoSet 5-15 cm H2O.  Daily usage is at 9 hours.  AHI 0.7.  Minimum leaks.  Wants to change from [place] to [place]. Was not pleased with [facility] customer care.   Patient has mild intermittent asthma.  She is on Singulair daily.  Has rare use of her albuterol.  She denies any chest pain shortness of breath or wheezing.     Allergies  Allergen Reactions  . Ace Inhibitors Hives  . Amoxicillin Hives  . Other Hives    Nucafed  . Penicillins Hives    Has patient had a PCN reaction causing immediate rash, facial/tongue/throat swelling, SOB or lightheadedness with hypotension: Yes Has patient had a PCN reaction causing severe rash involving mucus membranes or skin necrosis: No Has patient had a PCN reaction that required hospitalization No Has patient had a PCN reaction occurring within the last 10 years: No If all of the above answers are "NO", then may proceed with Cephalosporin use.   . Metformin And Related Other (See Comments)    GI upset  . Pseudoephedrine Hives  . Pseudoephedrine-Codeine Hives    Immunization History  Administered Date(s) Administered  . H1N1 10/30/2008  . Influenza Nasal 08/16/2016  . Influenza Whole 08/24/1998, 07/25/2009, 08/24/2010, 08/24/2012  . Influenza,inj,Quad PF,6-35 Mos 09/06/2015  . Influenza-Unspecified 09/03/2013, 09/04/2014, 08/22/2016, 09/23/2017  . Pneumococcal  Conjugate-13 02/08/2016  . Td 11/25/1995, 11/24/2005  . Tdap 02/08/2016    Past Medical History:  Diagnosis Date  . Allergic rhinitis   . Blepharospasm 10/09/2010  . Cancer (HCC)    Skin  . Hip pain   . History of melanoma excision    MULTIPLE SITES  . Hypertension   . Mild intermittent asthma   . OA (osteoarthritis)   . PMB (postmenopausal bleeding)   . PONV (postoperative nausea and vomiting)    did not experience after D&C, request to use same medicaitons from that procedure  . Precancerous changes of the cervix   . Sleep apnea    tested in home around 2 yrs ago  . Varicose vein    wears compression stocking    Tobacco History: Social History   Tobacco Use  Smoking Status Former Smoker  . Packs/day: 2.00  . Years: 10.00  . Pack years: 20.00  . Types: Cigarettes  . Last attempt to quit: 11/25/1987  . Years since quitting: 30.1  Smokeless Tobacco Never Used   Counseling given: Not Answered   Outpatient Encounter Medications as of 01/11/2018  Medication Sig  . albuterol (PROVENTIL HFA;VENTOLIN HFA) 108 (90 Base) MCG/ACT inhaler Inhale 2 puffs into the lungs every 6 (six) hours as needed for wheezing or shortness of breath.  Marland Kitchen amLODipine (NORVASC) 10 MG tablet Take 1 tablet (10 mg total) by mouth every morning.  Marland Kitchen aspirin 81 MG tablet Take 1 tablet (81 mg total) by mouth 2 (two)  times daily after a meal.  . calcium gluconate 500 MG tablet Take 1 tablet (500 mg total) by mouth daily.  . celecoxib (CELEBREX) 200 MG capsule TAKE ONE CAP BY MOUTH 2 TIMES DAILY AS NEEDED.MUST KEEP 02/10/17 APPT.FOR FUTURE REFILLS  . cetirizine (ZYRTEC) 10 MG tablet TAKE 1 TABLET (10 MG TOTAL) BY MOUTH DAILY. (Patient taking differently: Take 10 mg by mouth daily. TAKE 1 TABLET (10 MG TOTAL) BY MOUTH DAILY.)  . glipiZIDE (GLUCOTROL XL) 2.5 MG 24 hr tablet Take 1 tablet (2.5 mg total) by mouth daily with breakfast.  . Glucosamine-Chondroit-Vit C-Mn (GLUCOSAMINE 1500 COMPLEX PO) Take 1 tablet  by mouth 2 (two) times daily.   . irbesartan (AVAPRO) 150 MG tablet Take 1 tablet (150 mg total) by mouth daily.  . methocarbamol (ROBAXIN) 500 MG tablet Take 500 mg by mouth 4 (four) times daily.  . montelukast (SINGULAIR) 10 MG tablet Take 1 tablet (10 mg total) by mouth daily.  . Multiple Vitamin (MULTIVITAMIN WITH MINERALS) TABS tablet Take 1 tablet by mouth daily.  . traMADol (ULTRAM-ER) 200 MG 24 hr tablet TAKE ONE TABLET BY MOUTH DAILY  . [DISCONTINUED] docusate sodium (COLACE) 100 MG capsule Take 1 capsule (100 mg total) by mouth 2 (two) times daily.  . [DISCONTINUED] HYDROcodone-acetaminophen (NORCO) 5-325 MG tablet Take 1-2 tablets by mouth every 4 (four) hours as needed for moderate pain.  . [DISCONTINUED] meclizine (ANTIVERT) 12.5 MG tablet Take 1 tablet (12.5 mg total) by mouth 3 (three) times daily as needed for dizziness.  . [DISCONTINUED] metFORMIN (GLUCOPHAGE-XR) 500 MG 24 hr tablet Take 1 tablet (500 mg total) by mouth daily with breakfast.  . [DISCONTINUED] montelukast (SINGULAIR) 10 MG tablet TAKE 1 TABLET (10 MG TOTAL) BY MOUTH EVERY MORNING.  . [DISCONTINUED] predniSONE (DELTASONE) 10 MG tablet 3 tabs by mouth per day for 3 days, then 2 tabs per day for 3 days, then 1 tab per day for 3 days, then stop  . [DISCONTINUED] senna (SENOKOT) 8.6 MG TABS tablet Take 2 tablets (17.2 mg total) by mouth at bedtime.   No facility-administered encounter medications on file as of 01/11/2018.      Review of Systems  Constitutional:   No  weight loss, night sweats,  Fevers, chills, fatigue, or  lassitude.  HEENT:   No headaches,  Difficulty swallowing,  Tooth/dental problems, or  Sore throat,                No sneezing, itching, ear ache,  +nasal congestion, post nasal drip,   CV:  No chest pain,  Orthopnea, PND, swelling in lower extremities, anasarca, dizziness, palpitations, syncope.   GI  No heartburn, indigestion, abdominal pain, nausea, vomiting, diarrhea, change in bowel  habits, loss of appetite, bloody stools.   Resp: No shortness of breath with exertion or at rest.  No excess mucus, no productive cough,  No non-productive cough,  No coughing up of blood.  No change in color of mucus.  No wheezing.  No chest wall deformity  Skin: no rash or lesions.  GU: no dysuria, change in color of urine, no urgency or frequency.  No flank pain, no hematuria   MS:  No joint pain or swelling.  No decreased range of motion.  No back pain.    Physical Exam  BP 138/80 (BP Location: Left Arm, Cuff Size: Normal)   Pulse 76   Ht 5\' 7"  (1.702 m)   Wt 251 lb 6.4 oz (114 kg)   LMP 09/13/2013  SpO2 98%   BMI 39.37 kg/m   GEN: A/Ox3; pleasant , NAD, obese    HEENT:  [place]/AT,  EACs-clear, TMs-wnl, NOSE-clear, THROAT-clear, no lesions, no postnasal drip or exudate noted. Class 2-3 MP airway   NECK:  Supple w/ fair ROM; no JVD; normal carotid impulses w/o bruits; no thyromegaly or nodules palpated; no lymphadenopathy.    RESP  Clear  P & A; w/o, wheezes/ rales/ or rhonchi. no accessory muscle use, no dullness to percussion  CARD:  RRR, no m/r/g, no peripheral edema, pulses intact, no cyanosis or clubbing.  GI:   Soft & nt; nml bowel sounds; no organomegaly or masses detected.   Musco: Warm bil, no deformities or joint swelling noted.   Neuro: alert, no focal deficits noted.    Skin: Warm, no lesions or rashes    Lab Results:  CBC    Component Value Date/Time   WBC 5.3 01/06/2018 0716   RBC 4.64 01/06/2018 0716   HGB 14.1 01/06/2018 0716   HCT 41.2 01/06/2018 0716   PLT 202.0 01/06/2018 0716   MCV 88.8 01/06/2018 0716   MCH 28.9 10/01/2016 1203   MCHC 34.3 01/06/2018 0716   RDW 13.6 01/06/2018 0716   LYMPHSABS 1.9 01/06/2018 0716   MONOABS 0.3 01/06/2018 0716   EOSABS 0.1 01/06/2018 0716   BASOSABS 0.0 01/06/2018 0716    BMET    Component Value Date/Time   NA 140 01/06/2018 0716   K 4.0 01/06/2018 0716   CL 106 01/06/2018 0716   CO2 26  01/06/2018 0716   GLUCOSE 112 (H) 01/06/2018 0716   BUN 15 01/06/2018 0716   CREATININE 0.68 01/06/2018 0716   CALCIUM 9.3 01/06/2018 0716   GFRNONAA >60 10/04/2016 0610   GFRAA >60 10/04/2016 0610    BNP No results found for: BNP  ProBNP No results found for: PROBNP  Imaging: No results found.   Assessment & Plan:   Asthma Controlled on current regimen  Plan  Patient Instructions  Continue on CPAP at bedtime Work on healthy weight Do not drive a sleepy Continue on Singulair daily. Follow-up in 1 year with Dr. Elsworth Soho and as needed     Allergic rhinitis Controlled on current regimen   Plan  Patient Instructions  Continue on CPAP at bedtime Work on healthy weight Do not drive a sleepy Continue on Singulair daily. Follow-up in 1 year with Dr. Elsworth Soho and as needed     Obstructive sleep apnea Controlled on CPAP   Plan  Patient Instructions  Continue on CPAP at bedtime Work on healthy weight Do not drive a sleepy Continue on Singulair daily. Follow-up in 1 year with Dr. Elsworth Soho and as needed        Rexene Edison, NP 01/11/2018

## 2018-01-11 NOTE — Assessment & Plan Note (Signed)
Controlled on current regimen   Plan  Patient Instructions  Continue on CPAP at bedtime Work on healthy weight Do not drive a sleepy Continue on Singulair daily. Follow-up in 1 year with Dr. Elsworth Soho and as needed

## 2018-01-11 NOTE — Patient Instructions (Signed)
Continue on CPAP at bedtime Work on healthy weight Do not drive a sleepy Continue on Singulair daily. Follow-up in 1 year with Dr. Elsworth Soho and as needed

## 2018-01-11 NOTE — Assessment & Plan Note (Signed)

## 2018-01-11 NOTE — Patient Instructions (Addendum)
You had the Pneumovax pneumonia shot today  Your shingles shot was sent to the pharmacy  Please continue all other medications as before, and refills have been done if requested.  Please have the pharmacy call with any other refills you may need.  Please continue your efforts at being more active, low cholesterol diet, and weight control.  You are otherwise up to date with prevention measures today.  Please keep your appointments with your specialists as you may have planned  Please return in 6 months, or sooner if needed, with Lab testing done 3-5 days before

## 2018-01-11 NOTE — Progress Notes (Signed)
Subjective:    Patient ID: Tanya [name], female    DOB: 1956-05-16, 62 y.o.   MRN: [id number]  HPI  Here for wellness and f/u;  Overall doing ok;  Pt denies Chest pain, worsening SOB, DOE, wheezing, orthopnea, PND, worsening LE edema, palpitations, dizziness or syncope.  Pt denies neurological change such as new headache, facial or extremity weakness.  Pt denies polydipsia, polyuria, or low sugar symptoms. Pt states overall good compliance with treatment and medications, good tolerability, and has been trying to follow appropriate diet.  Pt denies worsening depressive symptoms, suicidal ideation or panic. No fever, night sweats, wt loss, loss of appetite, or other constitutional symptoms.  Pt states good ability with ADL's, has low fall risk, home safety reviewed and adequate, no other significant changes in hearing or vision, and only occasionally active with exercise.  Lost 10 lbs with better diet and lower calories since husband has new tool and die position on second shift.   Wt Readings from Last 3 Encounters:  01/11/18 253 lb (114.8 kg)  01/11/18 251 lb 6.4 oz (114 kg)  08/12/17 263 lb (119.3 kg)   Past Medical History:  Diagnosis Date  . Allergic rhinitis   . Blepharospasm 10/09/2010  . Cancer (HCC)    Skin  . Hip pain   . History of melanoma excision    MULTIPLE SITES  . Hypertension   . Mild intermittent asthma   . OA (osteoarthritis)   . PMB (postmenopausal bleeding)   . PONV (postoperative nausea and vomiting)    did not experience after D&C, request to use same medicaitons from that procedure  . Precancerous changes of the cervix   . Sleep apnea    tested in home around 2 yrs ago  . Varicose vein    wears compression stocking   Past Surgical History:  Procedure Laterality Date  . ABDOMINAL HYSTERECTOMY    . COLONOSCOPY    . DILATION AND CURETTAGE OF UTERUS    . ESOPHAGOGASTRODUODENOSCOPY  10/14/2004  . HAMMER TOE SURGERY    . HYSTEROSCOPY W/D&C N/A 04/07/2016   Procedure: DILATATION AND CURETTAGE /HYSTEROSCOPY WITH MYOSURE POLYPECTOMY;  Surgeon: Cheri Fowler, MD;  Location: [facility];  Service: Gynecology;  Laterality: N/A;  . JOINT REPLACEMENT    . KNEE ARTHROSCOPY Bilateral left 08-01-2005/  right  . LAPAROSCOPIC HYSTERECTOMY N/A 06/02/2016   Procedure: HYSTERECTOMY TOTAL LAPAROSCOPIC;  Surgeon: Cheri Fowler, MD;  Location: [facility];  Service: Gynecology;  Laterality: N/A;  . LAPAROSCOPIC SALPINGO OOPHERECTOMY Bilateral 06/02/2016   Procedure: LAPAROSCOPIC SALPINGO OOPHORECTOMY;  Surgeon: Cheri Fowler, MD;  Location: [facility];  Service: Gynecology;  Laterality: Bilateral;  . RE-DO ROTATOR CUFF REPAIR/  SAD/  ACROMIOPLASTY Right 01-04-2009  . SHOULDER ARTHROSCOPY W/ ROTATOR CUFF REPAIR Right   . tailor bunion removal    . TONSILLECTOMY    . TOTAL HIP ARTHROPLASTY Right 09/12/2016   Procedure: RIGHT TOTAL HIP ARTHROPLASTY ANTERIOR APPROACH;  Surgeon: Rod Can, MD;  Location: [place];  Service: Orthopedics;  Laterality: Right;  . TOTAL HIP ARTHROPLASTY Right 10/01/2016   Procedure: IRRIGATION AND DEBRIDEMENT RIGHT HIP;  Surgeon: Rod Can, MD;  Location: [facility];  Service: Orthopedics;  Laterality: Right;  . TOTAL KNEE ARTHROPLASTY Left 12/29/2013   Procedure: LEFT TOTAL KNEE ARTHROPLASTY;  Surgeon: Johnn Hai, MD;  Location: [facility];  Service: Orthopedics;  Laterality: Left;    reports that she quit smoking about 30 years ago. Her smoking use included cigarettes. She has  a 20.00 pack-year smoking history. she has never used smokeless tobacco. She reports that she drinks about 1.2 oz of alcohol per week. She reports that she does not use drugs. family history includes Asthma in her other; Diabetes in her other; Esophageal cancer in her father; Melanoma in her sister; Ovarian cancer in her other. Allergies  Allergen Reactions  . Ace Inhibitors Hives  . Amoxicillin Hives  . Other Hives    Nucafed  . Penicillins Hives    Has  patient had a PCN reaction causing immediate rash, facial/tongue/throat swelling, SOB or lightheadedness with hypotension: Yes Has patient had a PCN reaction causing severe rash involving mucus membranes or skin necrosis: No Has patient had a PCN reaction that required hospitalization No Has patient had a PCN reaction occurring within the last 10 years: No If all of the above answers are "NO", then may proceed with Cephalosporin use.   . Metformin And Related Other (See Comments)    GI upset  . Pseudoephedrine Hives  . Pseudoephedrine-Codeine Hives   Current Outpatient Medications on File Prior to Visit  Medication Sig Dispense Refill  . aspirin 81 MG tablet Take 1 tablet (81 mg total) by mouth 2 (two) times daily after a meal. 60 tablet 1  . calcium gluconate 500 MG tablet Take 1 tablet (500 mg total) by mouth daily. 90 tablet 3  . cetirizine (ZYRTEC) 10 MG tablet TAKE 1 TABLET (10 MG TOTAL) BY MOUTH DAILY. (Patient taking differently: Take 10 mg by mouth daily. TAKE 1 TABLET (10 MG TOTAL) BY MOUTH DAILY.) 90 tablet 3  . Glucosamine-Chondroit-Vit C-Mn (GLUCOSAMINE 1500 COMPLEX PO) Take 1 tablet by mouth 2 (two) times daily.     . methocarbamol (ROBAXIN) 500 MG tablet Take 500 mg by mouth 4 (four) times daily.    . Multiple Vitamin (MULTIVITAMIN WITH MINERALS) TABS tablet Take 1 tablet by mouth daily.    . traMADol (ULTRAM-ER) 200 MG 24 hr tablet TAKE ONE TABLET BY MOUTH DAILY 30 tablet 5   No current facility-administered medications on file prior to visit.    Review of Systems Constitutional: Negative for other unusual diaphoresis, sweats, appetite or weight changes HENT: Negative for other worsening hearing loss, ear pain, facial swelling, mouth sores or neck stiffness.   Eyes: Negative for other worsening pain, redness or other visual disturbance.  Respiratory: Negative for other stridor or swelling Cardiovascular: Negative for other palpitations or other chest pain    Gastrointestinal: Negative for worsening diarrhea or loose stools, blood in stool, distention or other pain Genitourinary: Negative for hematuria, flank pain or other change in urine volume.  Musculoskeletal: Negative for myalgias or other joint swelling.  Skin: Negative for other color change, or other wound or worsening drainage.  Neurological: Negative for other syncope or numbness. Hematological: Negative for other adenopathy or swelling Psychiatric/Behavioral: Negative for hallucinations, other worsening agitation, SI, self-injury, or new decreased concentration \\All  other system neg per pt    Objective:   Physical Exam BP 132/88   Pulse 82   Temp 98 F (36.7 C) (Oral)   Ht 5\' 7"  (1.702 m)   Wt 253 lb (114.8 kg)   LMP 09/13/2013   SpO2 98%   BMI 39.63 kg/m  VS noted, obese Constitutional: Pt is oriented to person, place, and time. Appears well-developed and well-nourished, in no significant distress and comfortable Head: Normocephalic and atraumatic  Eyes: Conjunctivae and EOM are normal. Pupils are equal, round, and reactive to light Right Ear: External ear  normal without discharge Left Ear: External ear normal without discharge Nose: Nose without discharge or deformity Mouth/Throat: Oropharynx is without other ulcerations and moist  Neck: Normal range of motion. Neck supple. No JVD present. No tracheal deviation present or significant neck LA or mass Cardiovascular: Normal rate, regular rhythm, normal heart sounds and intact distal pulses.   Pulmonary/Chest: WOB normal and breath sounds without rales or wheezing  Abdominal: Soft. Bowel sounds are normal. NT. No HSM  Musculoskeletal: Normal range of motion. Exhibits no edema Lymphadenopathy: Has no other cervical adenopathy.  Neurological: Pt is alert and oriented to person, place, and time. Pt has normal reflexes. No cranial nerve deficit. Motor grossly intact, Gait intact Skin: Skin is warm and dry. No rash noted or new  ulcerations Psychiatric:  Has normal mood and affect. Behavior is normal without agitation No other exam findings      Assessment & Plan:

## 2018-01-11 NOTE — Assessment & Plan Note (Signed)
Controlled on current regimen  Plan  Patient Instructions  Continue on CPAP at bedtime Work on healthy weight Do not drive a sleepy Continue on Singulair daily. Follow-up in 1 year with Dr. Elsworth Soho and as needed

## 2018-02-03 ENCOUNTER — Telehealth: Payer: Self-pay | Admitting: Pulmonary Disease

## 2018-02-03 NOTE — Telephone Encounter (Signed)
Spoke with patient. She is in the process of switching from [place] to [place]. Huey Romans needs the most recent sleep study that the patient has had. Per last OV by TP in February, paper chart had been requested. Spoke with Janett Billow, chart was not in fact requested.   Spoke with [facility], they do not have a copy of patient's sleep study since she was originally a [facility] patient.   Will request paper chart to see if sleep study is in there.

## 2018-02-05 ENCOUNTER — Other Ambulatory Visit: Payer: Self-pay | Admitting: Internal Medicine

## 2018-02-05 ENCOUNTER — Encounter: Payer: Self-pay | Admitting: Internal Medicine

## 2018-02-05 MED ORDER — TRAMADOL HCL ER 200 MG PO TB24: 200.0000 mg | ORAL_TABLET | Freq: Every day | ORAL | 0 refills | Status: DC

## 2018-02-05 NOTE — Telephone Encounter (Signed)
[place] Controlled Substance Database checked. Last filled on 01/06/18. Do you mind filling this as Dr Jenny Reichmann is out of the office.

## 2018-02-09 ENCOUNTER — Telehealth: Payer: Self-pay

## 2018-02-09 NOTE — Telephone Encounter (Signed)
Toy Baker, did you ever receive the paper chart? Thanks

## 2018-02-09 NOTE — Telephone Encounter (Signed)
Pt needs a prior auth for tramadol.   [facility] on [address] cb for pt is [phone] Pt is out of meds

## 2018-02-09 NOTE — Telephone Encounter (Signed)
Approved  01/10/18-02/09/19  Determination sent to scan

## 2018-02-10 NOTE — Telephone Encounter (Signed)
Paper chart not received. Rodena Piety is taking care of this.

## 2018-03-10 ENCOUNTER — Other Ambulatory Visit: Payer: Self-pay | Admitting: Internal Medicine

## 2018-03-10 NOTE — Telephone Encounter (Signed)
02/05/2018 30# 

## 2018-03-10 NOTE — Telephone Encounter (Signed)
Done erx 

## 2018-04-05 ENCOUNTER — Encounter: Payer: Self-pay | Admitting: Internal Medicine

## 2018-04-05 DIAGNOSIS — Z7184 Encounter for health counseling related to travel: Secondary | ICD-10-CM

## 2018-04-06 ENCOUNTER — Other Ambulatory Visit: Payer: Self-pay | Admitting: Internal Medicine

## 2018-04-07 NOTE — Telephone Encounter (Signed)
03/10/2018 30# 

## 2018-04-07 NOTE — Telephone Encounter (Signed)
Done erx 

## 2018-04-08 ENCOUNTER — Other Ambulatory Visit: Payer: 59

## 2018-04-08 DIAGNOSIS — Z7184 Encounter for health counseling related to travel: Secondary | ICD-10-CM

## 2018-04-09 LAB — RUBEOLA ANTIBODY IGG: RUBEOLA IGG: 54.2 [AU]/ml

## 2018-04-13 ENCOUNTER — Encounter: Payer: Self-pay | Admitting: Internal Medicine

## 2018-04-13 MED ORDER — MECLIZINE HCL 12.5 MG PO TABS: 12.5000 mg | ORAL_TABLET | Freq: Three times a day (TID) | ORAL | 1 refills | Status: DC | PRN

## 2018-05-05 ENCOUNTER — Other Ambulatory Visit: Payer: Self-pay | Admitting: Internal Medicine

## 2018-05-05 NOTE — Telephone Encounter (Signed)
Done erx 

## 2018-05-05 NOTE — Telephone Encounter (Signed)
04/07/2018 30# 

## 2018-05-31 ENCOUNTER — Encounter: Payer: Self-pay | Admitting: Internal Medicine

## 2018-06-21 ENCOUNTER — Other Ambulatory Visit: Payer: Self-pay | Admitting: Internal Medicine

## 2018-07-13 ENCOUNTER — Ambulatory Visit (INDEPENDENT_AMBULATORY_CARE_PROVIDER_SITE_OTHER): Payer: 59 | Admitting: Internal Medicine

## 2018-07-13 ENCOUNTER — Encounter: Payer: Self-pay | Admitting: Internal Medicine

## 2018-07-13 VITALS — BP 142/88 | HR 93 | Temp 98.1°F | Ht 67.0 in | Wt 246.0 lb

## 2018-07-13 DIAGNOSIS — J4520 Mild intermittent asthma, uncomplicated: Secondary | ICD-10-CM | POA: Diagnosis not present

## 2018-07-13 DIAGNOSIS — I10 Essential (primary) hypertension: Secondary | ICD-10-CM | POA: Diagnosis not present

## 2018-07-13 DIAGNOSIS — E119 Type 2 diabetes mellitus without complications: Secondary | ICD-10-CM

## 2018-07-13 LAB — POCT GLYCOSYLATED HEMOGLOBIN (HGB A1C)
HBA1C, POC (CONTROLLED DIABETIC RANGE): 0 % (ref 0.0–7.0)
HBA1C, POC (PREDIABETIC RANGE): 0 % — AB (ref 5.7–6.4)
HEMOGLOBIN A1C: 0 % (ref 4.0–5.6)
Hemoglobin A1C: 5.9 % — AB (ref 4.0–5.6)

## 2018-07-13 MED ORDER — IRBESARTAN 300 MG PO TABS: 300.0000 mg | ORAL_TABLET | Freq: Every day | ORAL | 3 refills | Status: DC

## 2018-07-13 NOTE — Assessment & Plan Note (Signed)
Mild uncontrolled, for increased avapro to 300 qd

## 2018-07-13 NOTE — Patient Instructions (Signed)
Your A1c was OK today  Please increase the Avapro (irbesartan) to 300 mg per day, and check your blood pressure on a regular basis at home, with the goal being less than 130/80  Please continue all other medications as before, and refills have been done if requested.  Please have the pharmacy call with any other refills you may need.  Please continue your efforts at being more active, low cholesterol diet, and weight control.  Please keep your appointments with your specialists as you may have planned  Please return in 6 months, or sooner if needed, with Lab testing done 3-5 days before

## 2018-07-13 NOTE — Progress Notes (Addendum)
Subjective:    Patient ID: Tanya [name], female    DOB: [birth year], 62 y.o.   MRN: [id number]  HPI Here to f/u; overall doing ok,  Pt denies chest pain, increasing sob or doe, wheezing, orthopnea, PND, increased LE swelling, palpitations, dizziness or syncope.  Pt denies new neurological symptoms such as new headache, or facial or extremity weakness or numbness.  Pt denies polydipsia, polyuria, or low sugar episode.  Pt states overall good compliance with meds, mostly trying to follow appropriate diet, with wt overall stable,  but little exercise however. No new complaints.  Has been able to lose wt with better diet, despite a recent [place] cruise BP Readings from Last 3 Encounters:  07/13/18 (!) 142/88  01/11/18 132/88  01/11/18 138/80   Wt Readings from Last 3 Encounters:  07/13/18 246 lb (111.6 kg)  01/11/18 253 lb (114.8 kg)  01/11/18 251 lb 6.4 oz (114 kg)   Past Medical History:  Diagnosis Date  . Allergic rhinitis   . Blepharospasm 10/09/2010  . Cancer (HCC)    Skin  . Hip pain   . History of melanoma excision    MULTIPLE SITES  . Hypertension   . Mild intermittent asthma   . OA (osteoarthritis)   . PMB (postmenopausal bleeding)   . PONV (postoperative nausea and vomiting)    did not experience after D&C, request to use same medicaitons from that procedure  . Precancerous changes of the cervix   . Sleep apnea    tested in home around 2 yrs ago  . Varicose vein    wears compression stocking   Past Surgical History:  Procedure Laterality Date  . ABDOMINAL HYSTERECTOMY    . COLONOSCOPY    . DILATION AND CURETTAGE OF UTERUS    . ESOPHAGOGASTRODUODENOSCOPY  10/14/2004  . HAMMER TOE SURGERY    . HYSTEROSCOPY W/D&C N/A 04/07/2016   Procedure: DILATATION AND CURETTAGE /HYSTEROSCOPY WITH MYOSURE POLYPECTOMY;  Surgeon: Cheri Fowler, MD;  Location: [facility];  Service: Gynecology;  Laterality: N/A;  . JOINT REPLACEMENT    . KNEE ARTHROSCOPY Bilateral  left 08-01-2005/  right  . LAPAROSCOPIC HYSTERECTOMY N/A 06/02/2016   Procedure: HYSTERECTOMY TOTAL LAPAROSCOPIC;  Surgeon: Cheri Fowler, MD;  Location: [facility];  Service: Gynecology;  Laterality: N/A;  . LAPAROSCOPIC SALPINGO OOPHERECTOMY Bilateral 06/02/2016   Procedure: LAPAROSCOPIC SALPINGO OOPHORECTOMY;  Surgeon: Cheri Fowler, MD;  Location: [facility];  Service: Gynecology;  Laterality: Bilateral;  . RE-DO ROTATOR CUFF REPAIR/  SAD/  ACROMIOPLASTY Right 01-04-2009  . SHOULDER ARTHROSCOPY W/ ROTATOR CUFF REPAIR Right   . tailor bunion removal    . TONSILLECTOMY    . TOTAL HIP ARTHROPLASTY Right 09/12/2016   Procedure: RIGHT TOTAL HIP ARTHROPLASTY ANTERIOR APPROACH;  Surgeon: Rod Can, MD;  Location: [facility];  Service: Orthopedics;  Laterality: Right;  . TOTAL HIP ARTHROPLASTY Right 10/01/2016   Procedure: IRRIGATION AND DEBRIDEMENT RIGHT HIP;  Surgeon: Rod Can, MD;  Location: [place] [facility];  Service: Orthopedics;  Laterality: Right;  . TOTAL KNEE ARTHROPLASTY Left 12/29/2013   Procedure: LEFT TOTAL KNEE ARTHROPLASTY;  Surgeon: Johnn Hai, MD;  Location: [facility];  Service: Orthopedics;  Laterality: Left;    reports that she quit smoking about 30 years ago. Her smoking use included cigarettes. She has a 20.00 pack-year smoking history. She has never used smokeless tobacco. She reports that she drinks about 2.0 standard drinks of alcohol per week. She reports that she does not use drugs. family history  includes Asthma in her other; Diabetes in her other; Esophageal cancer in her father; Melanoma in her sister; Ovarian cancer in her other. Allergies  Allergen Reactions  . Ace Inhibitors Hives  . Amoxicillin Hives  . Other Hives    Nucafed  . Penicillins Hives    Has patient had a PCN reaction causing immediate rash, facial/tongue/throat swelling, SOB or lightheadedness with hypotension: Yes Has patient had a PCN reaction causing severe rash involving mucus membranes or skin necrosis:  No Has patient had a PCN reaction that required hospitalization No Has patient had a PCN reaction occurring within the last 10 years: No If all of the above answers are "NO", then may proceed with Cephalosporin use.   . Metformin And Related Other (See Comments)    GI upset  . Pseudoephedrine Hives  . Pseudoephedrine-Codeine Hives   Current Outpatient Medications on File Prior to Visit  Medication Sig Dispense Refill  . albuterol (PROVENTIL HFA;VENTOLIN HFA) 108 (90 Base) MCG/ACT inhaler Inhale 2 puffs into the lungs every 6 (six) hours as needed for wheezing or shortness of breath. 1 Inhaler 3  . amLODipine (NORVASC) 10 MG tablet Take 1 tablet (10 mg total) by mouth every morning. 90 tablet 3  . aspirin 81 MG tablet Take 1 tablet (81 mg total) by mouth 2 (two) times daily after a meal. 60 tablet 1  . calcium gluconate 500 MG tablet Take 1 tablet (500 mg total) by mouth daily. 90 tablet 3  . celecoxib (CELEBREX) 200 MG capsule TAKE ONE CAP BY MOUTH 2 TIMES DAILY AS NEEDED.MUST KEEP 02/10/17 APPT.FOR FUTURE REFILLS 60 capsule 5  . cetirizine (ZYRTEC) 10 MG tablet TAKE 1 TABLET (10 MG TOTAL) BY MOUTH DAILY. (Patient taking differently: Take 10 mg by mouth daily. TAKE 1 TABLET (10 MG TOTAL) BY MOUTH DAILY.) 90 tablet 3  . glipiZIDE (GLUCOTROL XL) 2.5 MG 24 hr tablet Take 1 tablet (2.5 mg total) by mouth daily with breakfast. 90 tablet 3  . Glucosamine-Chondroit-Vit C-Mn (GLUCOSAMINE 1500 COMPLEX PO) Take 1 tablet by mouth 2 (two) times daily.     . meclizine (ANTIVERT) 12.5 MG tablet Take 1 tablet (12.5 mg total) by mouth 3 (three) times daily as needed for dizziness. 30 tablet 1  . methocarbamol (ROBAXIN) 500 MG tablet Take 500 mg by mouth 4 (four) times daily.    . montelukast (SINGULAIR) 10 MG tablet Take 1 tablet (10 mg total) by mouth daily. 90 tablet 3  . Multiple Vitamin (MULTIVITAMIN WITH MINERALS) TABS tablet Take 1 tablet by mouth daily.    . traMADol (ULTRAM-ER) 200 MG 24 hr tablet  TAKE 1 TABLET BY MOUTH EVERY DAY 30 tablet 5   No current facility-administered medications on file prior to visit.    Review of Systems  Constitutional: Negative for other unusual diaphoresis or sweats HENT: Negative for ear discharge or swelling Eyes: Negative for other worsening visual disturbances Respiratory: Negative for stridor or other swelling  Gastrointestinal: Negative for worsening distension or other blood Genitourinary: Negative for retention or other urinary change Musculoskeletal: Negative for other MSK pain or swelling Skin: Negative for color change or other new lesions Neurological: Negative for worsening tremors and other numbness  Psychiatric/Behavioral: Negative for worsening agitation or other fatigue All other system neg per pt    Objective:   Physical Exam BP (!) 142/88   Pulse 93   Temp 98.1 F (36.7 C) (Oral)   Ht 5\' 7"  (1.702 m)   Wt 246 lb (111.6  kg)   LMP 09/13/2013   SpO2 98%   BMI 38.53 kg/m  VS noted,  Constitutional: Pt appears in NAD HENT: Head: NCAT.  Right Ear: External ear normal.  Left Ear: External ear normal.  Eyes: . Pupils are equal, round, and reactive to light. Conjunctivae and EOM are normal Nose: without d/c or deformity Neck: Neck supple. Gross normal ROM Cardiovascular: Normal rate and regular rhythm.   Pulmonary/Chest: Effort normal and breath sounds without rales or wheezing.  Abd:  Soft, NT, ND, + BS, no organomegaly Neurological: Pt is alert. At baseline orientation, motor grossly intact Skin: Skin is warm. No rashes, other new lesions, no LE edema Psychiatric: Pt behavior is normal without agitation  No other exam findings Lab Results  Component Value Date   WBC 5.3 01/06/2018   HGB 14.1 01/06/2018   HCT 41.2 01/06/2018   PLT 202.0 01/06/2018   GLUCOSE 112 (H) 01/06/2018   CHOL 130 01/06/2018   TRIG 72.0 01/06/2018   HDL 47.90 01/06/2018   LDLCALC 68 01/06/2018   ALT 29 01/06/2018   AST 22 01/06/2018   NA  140 01/06/2018   K 4.0 01/06/2018   CL 106 01/06/2018   CREATININE 0.68 01/06/2018   BUN 15 01/06/2018   CO2 26 01/06/2018   TSH 0.84 01/06/2018   INR 0.97 12/23/2013   HGBA1C 6.2 01/06/2018    POCT glycosylated hemoglobin (Hb A1C)  Order: [id number]  Status:  Final result Visible to patient:  No (Not Released) Dx:  Type 2 diabetes mellitus without comp...   Ref Range & Units 08:54 54mo ago 56mo ago 62yr ago  Hemoglobin A1C 4.0 - 5.6 % 5.9Abnormal   6.2 R, CM 5.9 R 7.1High               Assessment & Plan:

## 2018-07-13 NOTE — Assessment & Plan Note (Signed)
stable overall by history and exam, recent data reviewed with pt, and pt to continue medical treatment as before,  to f/u any worsening symptoms or concerns  

## 2018-10-01 ENCOUNTER — Telehealth: Payer: Self-pay

## 2018-10-01 NOTE — Telephone Encounter (Signed)
Is patient a good candidate for Cologuard?  Copied from [place] [id number]. Topic: General - Other >> Oct 01, 2018 10:55 AM Yvette Rack wrote: Reason for CRM: Pt states she is suppose to have a colonoscopy but she prefers the cologuard. Pt states her insurance advised her that it has ordered due to a routine service then it will covered. Pt requests cologuard to be ordered as a routine service and mailed to her home address: [address] [place], [place] [address]

## 2018-10-01 NOTE — Telephone Encounter (Signed)
[place] for shirron to assists with ordering this, thanks

## 2018-10-04 LAB — COLOGUARD: COLOGUARD: NEGATIVE

## 2018-10-04 NOTE — Telephone Encounter (Signed)
Cologuard has been ordered. 

## 2018-11-03 ENCOUNTER — Encounter: Payer: Self-pay | Admitting: Internal Medicine

## 2018-11-05 ENCOUNTER — Encounter: Payer: Self-pay | Admitting: Internal Medicine

## 2018-11-05 ENCOUNTER — Other Ambulatory Visit: Payer: Self-pay | Admitting: Internal Medicine

## 2018-11-05 NOTE — Telephone Encounter (Signed)
Done erx 

## 2018-11-12 NOTE — Telephone Encounter (Signed)
error 

## 2018-12-25 ENCOUNTER — Other Ambulatory Visit: Payer: Self-pay | Admitting: Internal Medicine

## 2019-01-04 ENCOUNTER — Other Ambulatory Visit (INDEPENDENT_AMBULATORY_CARE_PROVIDER_SITE_OTHER): Payer: 59

## 2019-01-04 DIAGNOSIS — E119 Type 2 diabetes mellitus without complications: Secondary | ICD-10-CM

## 2019-01-04 LAB — CBC WITH DIFFERENTIAL/PLATELET
Basophils Absolute: 0 10*3/uL (ref 0.0–0.1)
Basophils Relative: 0.5 % (ref 0.0–3.0)
EOS ABS: 0.1 10*3/uL (ref 0.0–0.7)
Eosinophils Relative: 1.9 % (ref 0.0–5.0)
HCT: 42.5 % (ref 36.0–46.0)
Hemoglobin: 14.4 g/dL (ref 12.0–15.0)
Lymphocytes Relative: 27.6 % (ref 12.0–46.0)
Lymphs Abs: 1.8 10*3/uL (ref 0.7–4.0)
MCHC: 33.9 g/dL (ref 30.0–36.0)
MCV: 88.7 fl (ref 78.0–100.0)
MONO ABS: 0.4 10*3/uL (ref 0.1–1.0)
Monocytes Relative: 5.8 % (ref 3.0–12.0)
Neutro Abs: 4.2 10*3/uL (ref 1.4–7.7)
Neutrophils Relative %: 64.2 % (ref 43.0–77.0)
Platelets: 183 10*3/uL (ref 150.0–400.0)
RBC: 4.79 Mil/uL (ref 3.87–5.11)
RDW: 13.4 % (ref 11.5–15.5)
WBC: 6.5 10*3/uL (ref 4.0–10.5)

## 2019-01-04 LAB — URINALYSIS, ROUTINE W REFLEX MICROSCOPIC
Bilirubin Urine: NEGATIVE
Hgb urine dipstick: NEGATIVE
KETONES UR: NEGATIVE
LEUKOCYTE UA: NEGATIVE
Nitrite: NEGATIVE
PH: 7.5 (ref 5.0–8.0)
RBC / HPF: NONE SEEN (ref 0–?)
SPECIFIC GRAVITY, URINE: 1.01 (ref 1.000–1.030)
Total Protein, Urine: NEGATIVE
UROBILINOGEN UA: 0.2 (ref 0.0–1.0)
Urine Glucose: NEGATIVE
WBC, UA: NONE SEEN (ref 0–?)

## 2019-01-04 LAB — BASIC METABOLIC PANEL
BUN: 16 mg/dL (ref 6–23)
CO2: 27 mEq/L (ref 19–32)
Calcium: 9.5 mg/dL (ref 8.4–10.5)
Chloride: 105 mEq/L (ref 96–112)
Creatinine, Ser: 0.7 mg/dL (ref 0.40–1.20)
GFR: 85.1 mL/min (ref >60.00)
Glucose, Bld: 106 mg/dL — ABNORMAL HIGH (ref 70–99)
POTASSIUM: 4.4 meq/L (ref 3.5–5.1)
Sodium: 141 mEq/L (ref 135–145)

## 2019-01-04 LAB — HEPATIC FUNCTION PANEL
ALT: 18 U/L (ref 0–35)
AST: 16 U/L (ref 0–37)
Albumin: 4.2 g/dL (ref 3.5–5.2)
Alkaline Phosphatase: 71 U/L (ref 39–117)
Bilirubin, Direct: 0.1 mg/dL (ref 0.0–0.3)
Total Bilirubin: 0.6 mg/dL (ref 0.2–1.2)
Total Protein: 6.3 g/dL (ref 6.0–8.3)

## 2019-01-04 LAB — LIPID PANEL
Cholesterol: 132 mg/dL (ref 0–200)
HDL: 50.9 mg/dL (ref >39.00)
LDL CALC: 69 mg/dL (ref 0–99)
NonHDL: 81.59
Total CHOL/HDL Ratio: 3
Triglycerides: 64 mg/dL (ref 0.0–149.0)
VLDL: 12.8 mg/dL (ref 0.0–40.0)

## 2019-01-04 LAB — HEMOGLOBIN A1C: Hgb A1c MFr Bld: 5.9 % (ref 4.6–6.5)

## 2019-01-04 LAB — MICROALBUMIN / CREATININE URINE RATIO
Creatinine,U: 36.2 mg/dL
Microalb Creat Ratio: 1.9 mg/g (ref 0.0–30.0)
Microalb, Ur: <0.7 mg/dL (ref 0.0–1.9)

## 2019-01-04 LAB — TSH: TSH: 0.55 u[IU]/mL (ref 0.35–4.50)

## 2019-01-10 ENCOUNTER — Encounter: Payer: Self-pay | Admitting: Internal Medicine

## 2019-01-10 ENCOUNTER — Ambulatory Visit (INDEPENDENT_AMBULATORY_CARE_PROVIDER_SITE_OTHER): Payer: 59 | Admitting: Internal Medicine

## 2019-01-10 VITALS — BP 122/76 | HR 108 | Temp 97.9°F | Ht 67.0 in | Wt 249.0 lb

## 2019-01-10 DIAGNOSIS — G8929 Other chronic pain: Secondary | ICD-10-CM | POA: Diagnosis not present

## 2019-01-10 DIAGNOSIS — Z0000 Encounter for general adult medical examination without abnormal findings: Secondary | ICD-10-CM | POA: Diagnosis not present

## 2019-01-10 DIAGNOSIS — E119 Type 2 diabetes mellitus without complications: Secondary | ICD-10-CM | POA: Diagnosis not present

## 2019-01-10 MED ORDER — TOLTERODINE TARTRATE ER 4 MG PO CP24: 4.0000 mg | ORAL_CAPSULE | Freq: Every day | ORAL | 3 refills | Status: DC

## 2019-01-10 MED ORDER — CETIRIZINE HCL 10 MG PO TABS: ORAL_TABLET | ORAL | 3 refills | Status: DC

## 2019-01-10 MED ORDER — IRBESARTAN 300 MG PO TABS: 300.0000 mg | ORAL_TABLET | Freq: Every day | ORAL | 3 refills | Status: DC

## 2019-01-10 MED ORDER — TRAMADOL HCL ER 200 MG PO TB24: 200.0000 mg | ORAL_TABLET | Freq: Every day | ORAL | 5 refills | Status: DC

## 2019-01-10 MED ORDER — MONTELUKAST SODIUM 10 MG PO TABS: 10.0000 mg | ORAL_TABLET | Freq: Every day | ORAL | 3 refills | Status: DC

## 2019-01-10 MED ORDER — ALBUTEROL SULFATE HFA 108 (90 BASE) MCG/ACT IN AERS: 2.0000 | INHALATION_SPRAY | Freq: Four times a day (QID) | RESPIRATORY_TRACT | 5 refills | Status: DC | PRN

## 2019-01-10 MED ORDER — AMLODIPINE BESYLATE 10 MG PO TABS: 10.0000 mg | ORAL_TABLET | Freq: Every morning | ORAL | 3 refills | Status: DC

## 2019-01-10 MED ORDER — METHOCARBAMOL 500 MG PO TABS: 500.0000 mg | ORAL_TABLET | Freq: Four times a day (QID) | ORAL | 3 refills | Status: DC

## 2019-01-10 MED ORDER — CELECOXIB 200 MG PO CAPS: ORAL_CAPSULE | ORAL | 3 refills | Status: DC

## 2019-01-10 MED ORDER — MECLIZINE HCL 12.5 MG PO TABS: 12.5000 mg | ORAL_TABLET | Freq: Three times a day (TID) | ORAL | 11 refills | Status: AC | PRN

## 2019-01-10 MED ORDER — GLIPIZIDE ER 2.5 MG PO TB24: 2.5000 mg | ORAL_TABLET | Freq: Every day | ORAL | 3 refills | Status: DC

## 2019-01-10 NOTE — Assessment & Plan Note (Signed)

## 2019-01-10 NOTE — Assessment & Plan Note (Signed)
stable overall by history and exam, recent data reviewed with pt, and pt to continue medical treatment as before,  to f/u any worsening symptoms or concerns  

## 2019-01-10 NOTE — Patient Instructions (Signed)
Please continue all other medications as before, and refills have been done if requested.  Please have the pharmacy call with any other refills you may need.  Please continue your efforts at being more active, low cholesterol diet, and weight control.  You are otherwise up to date with prevention measures today.  Please keep your appointments with your specialists as you may have planned  Please return in 6 months, or sooner if needed, with Lab testing done 3-5 days before  

## 2019-01-10 NOTE — Progress Notes (Signed)
Subjective:    Patient ID: Tanya [name], female    DOB: [birth year], 63 y.o.   MRN: [id number]  HPI    Here for wellness and f/u;  Overall doing ok;  Pt denies Chest pain, worsening SOB, DOE, wheezing, orthopnea, PND, worsening LE edema, palpitations, dizziness or syncope.  Pt denies neurological change such as new headache, facial or extremity weakness.  Pt denies polydipsia, polyuria, or low sugar symptoms. Pt states overall good compliance with treatment and medications, good tolerability, and has been trying to follow appropriate diet.  Pt denies worsening depressive symptoms, suicidal ideation or panic. No fever, night sweats, wt loss, loss of appetite, or other constitutional symptoms.  Pt states good ability with ADL's, has low fall risk, home safety reviewed and adequate, no other significant changes in hearing or vision, and only occasionally active with exercise. S/p left knee TKR 2015, then right hip THR 2017, then TAH 2017 and just not wanting to take the right knee TKR yet.  Pian persists, mod to severe range.  Followed by Dr Kendall Flack.. Has an exercise bike but cannot do much with this.  No new complaints Past Medical History:  Diagnosis Date  . Allergic rhinitis   . Blepharospasm 10/09/2010  . Cancer (HCC)    Skin  . Hip pain   . History of melanoma excision    MULTIPLE SITES  . Hypertension   . Mild intermittent asthma   . OA (osteoarthritis)   . PMB (postmenopausal bleeding)   . PONV (postoperative nausea and vomiting)    did not experience after D&C, request to use same medicaitons from that procedure  . Precancerous changes of the cervix   . Sleep apnea    tested in home around 2 yrs ago  . Varicose vein    wears compression stocking   Past Surgical History:  Procedure Laterality Date  . ABDOMINAL HYSTERECTOMY    . COLONOSCOPY    . DILATION AND CURETTAGE OF UTERUS    . ESOPHAGOGASTRODUODENOSCOPY  10/14/2004  . HAMMER TOE SURGERY    . HYSTEROSCOPY W/D&C N/A  04/07/2016   Procedure: DILATATION AND CURETTAGE /HYSTEROSCOPY WITH MYOSURE POLYPECTOMY;  Surgeon: Cheri Fowler, MD;  Location: [facility];  Service: Gynecology;  Laterality: N/A;  . JOINT REPLACEMENT    . KNEE ARTHROSCOPY Bilateral left 08-01-2005/  right  . LAPAROSCOPIC HYSTERECTOMY N/A 06/02/2016   Procedure: HYSTERECTOMY TOTAL LAPAROSCOPIC;  Surgeon: Cheri Fowler, MD;  Location: [facility];  Service: Gynecology;  Laterality: N/A;  . LAPAROSCOPIC SALPINGO OOPHERECTOMY Bilateral 06/02/2016   Procedure: LAPAROSCOPIC SALPINGO OOPHORECTOMY;  Surgeon: Cheri Fowler, MD;  Location: [facility];  Service: Gynecology;  Laterality: Bilateral;  . RE-DO ROTATOR CUFF REPAIR/  SAD/  ACROMIOPLASTY Right 01-04-2009  . SHOULDER ARTHROSCOPY W/ ROTATOR CUFF REPAIR Right   . tailor bunion removal    . TONSILLECTOMY    . TOTAL HIP ARTHROPLASTY Right 09/12/2016   Procedure: RIGHT TOTAL HIP ARTHROPLASTY ANTERIOR APPROACH;  Surgeon: Rod Can, MD;  Location: [facility];  Service: Orthopedics;  Laterality: Right;  . TOTAL HIP ARTHROPLASTY Right 10/01/2016   Procedure: IRRIGATION AND DEBRIDEMENT RIGHT HIP;  Surgeon: Rod Can, MD;  Location: [facility];  Service: Orthopedics;  Laterality: Right;  . TOTAL KNEE ARTHROPLASTY Left 12/29/2013   Procedure: LEFT TOTAL KNEE ARTHROPLASTY;  Surgeon: Johnn Hai, MD;  Location: [facility];  Service: Orthopedics;  Laterality: Left;    reports that she quit smoking about 31 years ago. Her smoking use included cigarettes.  She has a 20.00 pack-year smoking history. She has never used smokeless tobacco. She reports current alcohol use of about 2.0 standard drinks of alcohol per week. She reports that she does not use drugs. family history includes Asthma in an other family member; Diabetes in an other family member; Esophageal cancer in her father; Melanoma in her sister; Ovarian cancer in an other family member. Allergies  Allergen Reactions  . Ace Inhibitors Hives  .  Amoxicillin Hives  . Other Hives    Nucafed  . Penicillins Hives    Has patient had a PCN reaction causing immediate rash, facial/tongue/throat swelling, SOB or lightheadedness with hypotension: Yes Has patient had a PCN reaction causing severe rash involving mucus membranes or skin necrosis: No Has patient had a PCN reaction that required hospitalization No Has patient had a PCN reaction occurring within the last 10 years: No If all of the above answers are "NO", then may proceed with Cephalosporin use.   . Metformin And Related Other (See Comments)    GI upset  . Pseudoephedrine Hives  . Pseudoephedrine-Codeine Hives   Current Outpatient Medications on File Prior to Visit  Medication Sig Dispense Refill  . aspirin 81 MG tablet Take 1 tablet (81 mg total) by mouth 2 (two) times daily after a meal. 60 tablet 1  . calcium gluconate 500 MG tablet Take 1 tablet (500 mg total) by mouth daily. 90 tablet 3  . Glucosamine-Chondroit-Vit C-Mn (GLUCOSAMINE 1500 COMPLEX PO) Take 1 tablet by mouth 2 (two) times daily.     . Multiple Vitamin (MULTIVITAMIN WITH MINERALS) TABS tablet Take 1 tablet by mouth daily.     No current facility-administered medications on file prior to visit.   ' Review of Systems Constitutional: Negative for other unusual diaphoresis, sweats, appetite or weight changes HENT: Negative for other worsening hearing loss, ear pain, facial swelling, mouth sores or neck stiffness.   Eyes: Negative for other worsening pain, redness or other visual disturbance.  Respiratory: Negative for other stridor or swelling Cardiovascular: Negative for other palpitations or other chest pain  Gastrointestinal: Negative for worsening diarrhea or loose stools, blood in stool, distention or other pain Genitourinary: Negative for hematuria, flank pain or other change in urine volume.  Musculoskeletal: Negative for myalgias or other joint swelling.  Skin: Negative for other color change, or other  wound or worsening drainage.  Neurological: Negative for other syncope or numbness. Hematological: Negative for other adenopathy or swelling Psychiatric/Behavioral: Negative for hallucinations, other worsening agitation, SI, self-injury, or new decreased concentration All other system neg per pt    Objective:   Physical Exam BP 122/76   Pulse (!) 108   Temp 97.9 F (36.6 C) (Oral)   Ht 5\' 7"  (1.702 m)   Wt 249 lb (112.9 kg)   LMP 09/13/2013   SpO2 96%   BMI 39.00 kg/m  VS noted,  Constitutional: Pt is oriented to person, place, and time. Appears well-developed and well-nourished, in no significant distress and comfortable Head: Normocephalic and atraumatic  Eyes: Conjunctivae and EOM are normal. Pupils are equal, round, and reactive to light Right Ear: External ear normal without discharge Left Ear: External ear normal without discharge Nose: Nose without discharge or deformity Mouth/Throat: Oropharynx is without other ulcerations and moist  Neck: Normal range of motion. Neck supple. No JVD present. No tracheal deviation present or significant neck LA or mass Cardiovascular: Normal rate, regular rhythm, normal heart sounds and intact distal pulses.   Pulmonary/Chest: WOB normal and  breath sounds without rales or wheezing  Abdominal: Soft. Bowel sounds are normal. NT. No HSM  Musculoskeletal: Normal range of motion. Exhibits no edema Lymphadenopathy: Has no other cervical adenopathy.  Neurological: Pt is alert and oriented to person, place, and time. Pt has normal reflexes. No cranial nerve deficit. Motor grossly intact, Gait intact Skin: Skin is warm and dry. No rash noted or new ulcerations Psychiatric:  Has normal mood and affect. Behavior is normal without agitation No other exam findings Lab Results  Component Value Date   WBC 6.5 01/04/2019   HGB 14.4 01/04/2019   HCT 42.5 01/04/2019   PLT 183.0 01/04/2019   GLUCOSE 106 (H) 01/04/2019   CHOL 132 01/04/2019   TRIG 64.0  01/04/2019   HDL 50.90 01/04/2019   LDLCALC 69 01/04/2019   ALT 18 01/04/2019   AST 16 01/04/2019   NA 141 01/04/2019   K 4.4 01/04/2019   CL 105 01/04/2019   CREATININE 0.70 01/04/2019   BUN 16 01/04/2019   CO2 27 01/04/2019   TSH 0.55 01/04/2019   INR 0.97 12/23/2013   HGBA1C 5.9 01/04/2019   MICROALBUR <0.7 01/04/2019       Assessment & Plan:

## 2019-01-13 ENCOUNTER — Encounter: Payer: Self-pay | Admitting: Pulmonary Disease

## 2019-01-13 ENCOUNTER — Ambulatory Visit (INDEPENDENT_AMBULATORY_CARE_PROVIDER_SITE_OTHER): Payer: 59 | Admitting: Pulmonary Disease

## 2019-01-13 DIAGNOSIS — G4733 Obstructive sleep apnea (adult) (pediatric): Secondary | ICD-10-CM | POA: Diagnosis not present

## 2019-01-13 DIAGNOSIS — J4520 Mild intermittent asthma, uncomplicated: Secondary | ICD-10-CM | POA: Diagnosis not present

## 2019-01-13 NOTE — Assessment & Plan Note (Signed)
Continue on Singulair for allergies She takes Zyrtec around the year

## 2019-01-13 NOTE — Progress Notes (Signed)
   Subjective:    Patient ID: Tanya [name], female    DOB: 24-Aug-1956, 63 y.o.   MRN: [id number]  HPI  63 yo ex smoker for fu of obstructive sleep apnea &mild int asthma .  She moved from [place] to [place].  PSG in [place] in 1997/98 @ [facility] (not available) >>severe obstructive sleep apnea &set up on nasal CPAP - supplier is [employer] [facility].  Asthma was diagnosed as a child , she grew out of this as an adult &is now back to using pro-air , 2 cannisters/ year.   She uses a mouth guard for bruxism  Chief Complaint  Patient presents with  . Follow-up    12 mo f/u for OSA. States her breathing has been ok since her last visit.     Annual follow-up, she has done well, very compliant with her machine. She has settled down with nasal mask.  CPAP download was reviewed which shows excellent compliance, good control of events on average pressure 13 cm on auto settings, minimal leak. No dryness or problems with pressure.  Asthma is well controlled, she has not needed her rescue inhaler. She takes Zyrtec for perennial allergies and uses Singulair for dust  Review of Systems Patient denies significant dyspnea,cough, hemoptysis,  chest pain, palpitations, pedal edema, orthopnea, paroxysmal nocturnal dyspnea, lightheadedness, nausea, vomiting, abdominal or  leg pains      Objective:   Physical Exam   Gen. Pleasant, obese, in no distress ENT - no lesions, no post nasal drip Neck: No JVD, no thyromegaly, no carotid bruits Lungs: no use of accessory muscles, no dullness to percussion, decreased without rales or rhonchi  Cardiovascular: Rhythm regular, heart sounds  normal, no murmurs or gallops, no peripheral edema Musculoskeletal: No deformities, no cyanosis or clubbing , no tremors      Assessment & Plan:

## 2019-01-13 NOTE — Patient Instructions (Signed)
CPAP is working well on current auto settings. You will qualify for a new machine Stay on Singulair

## 2019-01-13 NOTE — Assessment & Plan Note (Signed)
CPAP is working well on current auto settings, average pressure 13 cm. She is compliant and CPAP is certainly helped improve her daytime somnolence and fatigue. She would qualify for a new machine but she would like to hold off  Weight loss encouraged, compliance with goal of at least 4-6 hrs every night is the expectation. Advised against medications with sedative side effects Cautioned against driving when sleepy - understanding that sleepiness will vary on a day to day basis

## 2019-01-27 ENCOUNTER — Encounter: Payer: Self-pay | Admitting: Internal Medicine

## 2019-02-01 ENCOUNTER — Encounter: Payer: Self-pay | Admitting: Internal Medicine

## 2019-03-01 NOTE — Telephone Encounter (Signed)
error 

## 2019-03-11 ENCOUNTER — Encounter: Payer: Self-pay | Admitting: Internal Medicine

## 2019-06-07 ENCOUNTER — Other Ambulatory Visit: Payer: Self-pay | Admitting: Internal Medicine

## 2019-07-11 ENCOUNTER — Encounter: Payer: Self-pay | Admitting: Internal Medicine

## 2019-07-11 ENCOUNTER — Ambulatory Visit (INDEPENDENT_AMBULATORY_CARE_PROVIDER_SITE_OTHER): Payer: 59 | Admitting: Internal Medicine

## 2019-07-11 ENCOUNTER — Other Ambulatory Visit: Payer: Self-pay

## 2019-07-11 VITALS — BP 118/76 | HR 99 | Temp 98.3°F | Ht 67.0 in | Wt 247.0 lb

## 2019-07-11 DIAGNOSIS — E119 Type 2 diabetes mellitus without complications: Secondary | ICD-10-CM

## 2019-07-11 DIAGNOSIS — M1990 Unspecified osteoarthritis, unspecified site: Secondary | ICD-10-CM

## 2019-07-11 DIAGNOSIS — I10 Essential (primary) hypertension: Secondary | ICD-10-CM

## 2019-07-11 DIAGNOSIS — E611 Iron deficiency: Secondary | ICD-10-CM

## 2019-07-11 DIAGNOSIS — Z0000 Encounter for general adult medical examination without abnormal findings: Secondary | ICD-10-CM

## 2019-07-11 DIAGNOSIS — E559 Vitamin D deficiency, unspecified: Secondary | ICD-10-CM

## 2019-07-11 DIAGNOSIS — E538 Deficiency of other specified B group vitamins: Secondary | ICD-10-CM

## 2019-07-11 LAB — POCT GLYCOSYLATED HEMOGLOBIN (HGB A1C): Hemoglobin A1C: 6 % — AB (ref 4.0–5.6)

## 2019-07-11 NOTE — Assessment & Plan Note (Signed)
stable overall by history and exam, recent data reviewed with pt, and pt to continue medical treatment as before,  to f/u any worsening symptoms or concerns  

## 2019-07-11 NOTE — Patient Instructions (Addendum)
Your A1c was OK today  Please continue all other medications as before, and refills have been done if requested.  Please have the pharmacy call with any other refills you may need.  Please continue your efforts at being more active, low cholesterol diet, and weight control..  Please keep your appointments with your specialists as you may have planned  Please return in 6 months, or sooner if needed, with Lab testing done 3-5 days before  

## 2019-07-11 NOTE — Progress Notes (Signed)
Subjective:    Patient ID: Tanya [name], female    DOB: 1956-03-18, 63 y.o.   MRN: [id number]  HPI  Here to f/u; overall doing ok,  Pt denies chest pain, increasing sob or doe, wheezing, orthopnea, PND, increased LE swelling, palpitations, dizziness or syncope.  Pt denies new neurological symptoms such as new headache, or facial or extremity weakness or numbness.  Pt denies polydipsia, polyuria, or low sugar episode.  Pt states overall good compliance with meds, mostly trying to follow appropriate diet, with wt overall stable,  but little exercise however.  No new complaints Past Medical History:  Diagnosis Date   Allergic rhinitis    Blepharospasm 10/09/2010   Cancer ([place])    Skin   Hip pain    History of melanoma excision    MULTIPLE SITES   Hypertension    Mild intermittent asthma    OA (osteoarthritis)    PMB (postmenopausal bleeding)    PONV (postoperative nausea and vomiting)    did not experience after D&C, request to use same medicaitons from that procedure   Precancerous changes of the cervix    Sleep apnea    tested in home around 2 yrs ago   Varicose vein    wears compression stocking   Past Surgical History:  Procedure Laterality Date   ABDOMINAL HYSTERECTOMY     COLONOSCOPY     DILATION AND CURETTAGE OF UTERUS     ESOPHAGOGASTRODUODENOSCOPY  10/14/2004   HAMMER TOE SURGERY     HYSTEROSCOPY W/D&C N/A 04/07/2016   Procedure: DILATATION AND CURETTAGE /HYSTEROSCOPY WITH MYOSURE POLYPECTOMY;  Surgeon: Cheri Fowler, MD;  Location: [facility];  Service: Gynecology;  Laterality: N/A;   JOINT REPLACEMENT     KNEE ARTHROSCOPY Bilateral left 08-01-2005/  right   LAPAROSCOPIC HYSTERECTOMY N/A 06/02/2016   Procedure: HYSTERECTOMY TOTAL LAPAROSCOPIC;  Surgeon: Cheri Fowler, MD;  Location: [facility];  Service: Gynecology;  Laterality: N/A;   LAPAROSCOPIC SALPINGO OOPHERECTOMY Bilateral 06/02/2016   Procedure: LAPAROSCOPIC SALPINGO  OOPHORECTOMY;  Surgeon: Cheri Fowler, MD;  Location: [facility];  Service: Gynecology;  Laterality: Bilateral;   RE-DO ROTATOR CUFF REPAIR/  SAD/  ACROMIOPLASTY Right 01-04-2009   SHOULDER ARTHROSCOPY W/ ROTATOR CUFF REPAIR Right    tailor bunion removal     TONSILLECTOMY     TOTAL HIP ARTHROPLASTY Right 09/12/2016   Procedure: RIGHT TOTAL HIP ARTHROPLASTY ANTERIOR APPROACH;  Surgeon: Rod Can, MD;  Location: [place];  Service: Orthopedics;  Laterality: Right;   TOTAL HIP ARTHROPLASTY Right 10/01/2016   Procedure: IRRIGATION AND DEBRIDEMENT RIGHT HIP;  Surgeon: Rod Can, MD;  Location: [place];  Service: Orthopedics;  Laterality: Right;   TOTAL KNEE ARTHROPLASTY Left 12/29/2013   Procedure: LEFT TOTAL KNEE ARTHROPLASTY;  Surgeon: Johnn Hai, MD;  Location: [facility];  Service: Orthopedics;  Laterality: Left;    reports that she quit smoking about 31 years ago. Her smoking use included cigarettes. She has a 20.00 pack-year smoking history. She has never used smokeless tobacco. She reports current alcohol use of about 2.0 standard drinks of alcohol per week. She reports that she does not use drugs. family history includes Asthma in an other family member; Diabetes in an other family member; Esophageal cancer in her father; Melanoma in her sister; Ovarian cancer in an other family member. Allergies  Allergen Reactions   Ace Inhibitors Hives   Amoxicillin Hives   Other Hives    Nucafed   Penicillins Hives    Has  patient had a PCN reaction causing immediate rash, facial/tongue/throat swelling, SOB or lightheadedness with hypotension: Yes Has patient had a PCN reaction causing severe rash involving mucus membranes or skin necrosis: No Has patient had a PCN reaction that required hospitalization No Has patient had a PCN reaction occurring within the last 10 years: No If all of the above answers are "NO", then may proceed with Cephalosporin use.    Metformin And Related Other (See  Comments)    GI upset   Pseudoephedrine Hives   Pseudoephedrine-Codeine Hives   Current Outpatient Medications on File Prior to Visit  Medication Sig Dispense Refill   albuterol (PROVENTIL HFA;VENTOLIN HFA) 108 (90 Base) MCG/ACT inhaler Inhale 2 puffs into the lungs every 6 (six) hours as needed for wheezing or shortness of breath. 1 Inhaler 5   amLODipine (NORVASC) 10 MG tablet Take 1 tablet (10 mg total) by mouth every morning. 90 tablet 3   aspirin 81 MG tablet Take 1 tablet (81 mg total) by mouth 2 (two) times daily after a meal. 60 tablet 1   calcium gluconate 500 MG tablet Take 1 tablet (500 mg total) by mouth daily. 90 tablet 3   celecoxib (CELEBREX) 200 MG capsule TAKE ONE CAP BY MOUTH 2 TIMES DAILY AS NEEDED. 180 capsule 3   cetirizine (ZYRTEC) 10 MG tablet TAKE 1 TABLET (10 MG TOTAL) BY MOUTH DAILY. 90 tablet 3   glipiZIDE (GLUCOTROL XL) 2.5 MG 24 hr tablet Take 1 tablet (2.5 mg total) by mouth daily with breakfast. 90 tablet 3   Glucosamine-Chondroit-Vit C-Mn (GLUCOSAMINE 1500 COMPLEX PO) Take 1 tablet by mouth 2 (two) times daily.      irbesartan (AVAPRO) 300 MG tablet Take 1 tablet (300 mg total) by mouth daily. 90 tablet 3   meclizine (ANTIVERT) 12.5 MG tablet Take 1 tablet (12.5 mg total) by mouth 3 (three) times daily as needed for dizziness. 30 tablet 11   methocarbamol (ROBAXIN) 500 MG tablet Take 1 tablet (500 mg total) by mouth 4 (four) times daily. 90 tablet 3   montelukast (SINGULAIR) 10 MG tablet Take 1 tablet (10 mg total) by mouth daily. 90 tablet 3   Multiple Vitamin (MULTIVITAMIN WITH MINERALS) TABS tablet Take 1 tablet by mouth daily.     tolterodine (DETROL LA) 4 MG 24 hr capsule Take 1 capsule (4 mg total) by mouth daily. 90 capsule 3   traMADol (ULTRAM-ER) 200 MG 24 hr tablet Take 1 tablet (200 mg total) by mouth daily. 30 tablet 5   No current facility-administered medications on file prior to visit.    Review of Systems  Constitutional:  Negative for other unusual diaphoresis or sweats HENT: Negative for ear discharge or swelling Eyes: Negative for other worsening visual disturbances Respiratory: Negative for stridor or other swelling  Gastrointestinal: Negative for worsening distension or other blood Genitourinary: Negative for retention or other urinary change Musculoskeletal: Negative for other MSK pain or swelling Skin: Negative for color change or other new lesions Neurological: Negative for worsening tremors and other numbness  Psychiatric/Behavioral: Negative for worsening agitation or other fatigue All other system neg per pt    Objective:   Physical Exam BP 118/76    Pulse 99    Temp 98.3 F (36.8 C) (Oral)    Ht 5\' 7"  (1.702 m)    Wt 247 lb (112 kg)    LMP 09/13/2013    SpO2 97%    BMI 38.69 kg/m  VS noted,  Constitutional: Pt appears in  NAD HENT: Head: NCAT.  Right Ear: External ear normal.  Left Ear: External ear normal.  Eyes: . Pupils are equal, round, and reactive to light. Conjunctivae and EOM are normal Nose: without d/c or deformity Neck: Neck supple. Gross normal ROM Cardiovascular: Normal rate and regular rhythm.   Pulmonary/Chest: Effort normal and breath sounds without rales or wheezing.  Abd:  Soft, NT, ND, + BS, no organomegaly Neurological: Pt is alert. At baseline orientation, motor grossly intact Skin: Skin is warm. No rashes, other new lesions, no LE edema Psychiatric: Pt behavior is normal without agitation  No other exam findings  POCT glycosylated hemoglobin (Hb A1C) Order: [id number] Status:  Final result Visible to patient:  No (not released) Dx:  Type 2 diabetes mellitus without comp...  Ref Range & Units 14:25 (07/11/19) 12mo ago (01/04/19) 7mo ago (07/13/18) 46yr ago (01/06/18) 41yr ago (08/12/17) 67yr ago (02/09/17) 2yr ago (01/10/16)  Hemoglobin A1C 4.0 - 5.6 % 6.0Abnormal   5.9 R, CM  5.9Abnormal   6.2 R, CM  5.9 R  7.1High  R, CM  6.4            Assessment & Plan:

## 2019-08-06 ENCOUNTER — Other Ambulatory Visit: Payer: Self-pay | Admitting: Internal Medicine

## 2019-08-08 NOTE — Telephone Encounter (Signed)
Done erx 

## 2019-09-09 ENCOUNTER — Other Ambulatory Visit: Payer: Self-pay | Admitting: Internal Medicine

## 2019-09-27 DIAGNOSIS — L989 Disorder of the skin and subcutaneous tissue, unspecified: Secondary | ICD-10-CM | POA: Diagnosis not present

## 2019-09-27 DIAGNOSIS — D034 Melanoma in situ of scalp and neck: Secondary | ICD-10-CM | POA: Diagnosis not present

## 2019-09-27 DIAGNOSIS — L905 Scar conditions and fibrosis of skin: Secondary | ICD-10-CM | POA: Diagnosis not present

## 2019-10-13 ENCOUNTER — Telehealth: Payer: Self-pay

## 2019-10-13 NOTE — Telephone Encounter (Signed)
PA has been submitted Key: [id number]    Copied from [place] [id number]. Topic: General - Other >> Oct 13, 2019 12:20 PM Leward Quan A wrote: Reason for CRM: Patient called to say that she is going out of town on Saturday and was trying to fill her Rx for celecoxib (CELEBREX) 200 MG capsule but due to a change in insurance to [employer] they require a prior authorization and she is needing this done today please so she can get her medication tomorrow. Patient would like a call back at Ph# [phone]

## 2019-10-14 ENCOUNTER — Telehealth: Payer: Self-pay | Admitting: Internal Medicine

## 2019-10-14 ENCOUNTER — Telehealth: Payer: Self-pay

## 2019-10-14 NOTE — Telephone Encounter (Signed)
Called pt, LVM, informing pt that PA was completed and approved for the Celebrex. Advised he to check back with her pharmacy in regard to having it filled.   Copied from [place] [id number]. Topic: General - Inquiry >> Oct 14, 2019  2:44 PM Tanya [name] wrote: Reason for CRM: Patient called in regarding her medications. Patient changed insurance companies and now needs authorization for all mediations. Please advise

## 2019-10-20 ENCOUNTER — Other Ambulatory Visit: Payer: Self-pay | Admitting: Internal Medicine

## 2019-11-14 DIAGNOSIS — G5131 Clonic hemifacial spasm, right: Secondary | ICD-10-CM | POA: Diagnosis not present

## 2019-11-29 ENCOUNTER — Encounter: Payer: Self-pay | Admitting: Internal Medicine

## 2019-12-14 ENCOUNTER — Other Ambulatory Visit: Payer: Self-pay | Admitting: Internal Medicine

## 2019-12-14 NOTE — Telephone Encounter (Signed)
Please refill as per office routine med refill policy (all routine meds refilled for 3 mo or monthly per pt preference up to one year from last visit, then month to month grace period for 3 mo, then further med refills will have to be denied)  

## 2019-12-16 ENCOUNTER — Telehealth: Payer: Self-pay | Admitting: *Deleted

## 2019-12-16 NOTE — Telephone Encounter (Signed)
Glipizide 2.5 mg PA initiated via CoverMyMeds  Key: [id number]  PA Case ID: [id number]  Rx #: [id number]

## 2019-12-20 ENCOUNTER — Telehealth: Payer: Self-pay

## 2019-12-20 NOTE — Telephone Encounter (Signed)
PA initiated via Cover my Meds.  KeyFrankey Poot  PA Case ID: [id number] Rx #: [id number]

## 2019-12-22 ENCOUNTER — Encounter: Payer: Self-pay | Admitting: Internal Medicine

## 2019-12-22 MED ORDER — TRAMADOL HCL ER 200 MG PO TB24: 200.0000 mg | ORAL_TABLET | Freq: Every day | ORAL | 5 refills | Status: DC

## 2019-12-22 MED ORDER — CELECOXIB 200 MG PO CAPS: ORAL_CAPSULE | ORAL | 0 refills | Status: DC

## 2019-12-22 MED ORDER — CALCIUM GLUCONATE 500 MG PO TABS: 1.0000 | ORAL_TABLET | Freq: Every day | ORAL | 0 refills | Status: DC

## 2019-12-22 NOTE — Telephone Encounter (Signed)
Check [place] registry last filled Tramadol 12/14/2019.Marland KitchenJohny Chess

## 2019-12-22 NOTE — Telephone Encounter (Signed)
Done erx  [facility] for long acting tramadol for better control of chronic pain (knees , hip) not expected to improve

## 2019-12-26 ENCOUNTER — Telehealth: Payer: Self-pay

## 2019-12-26 NOTE — Telephone Encounter (Signed)
Received approval from [facility] for Glipizide ER 2.5mg  tabs.  Approved from 12-16-2019 to 12-15-2020. [facility] [place] notified

## 2019-12-27 DIAGNOSIS — U071 COVID-19: Secondary | ICD-10-CM | POA: Diagnosis not present

## 2020-01-02 NOTE — Telephone Encounter (Signed)
Called pharmacy left voicemail informing them that [employer] appproved Tramadol ER 200mg  tabs from 12-26-19 to 03-25-20

## 2020-01-09 ENCOUNTER — Other Ambulatory Visit (INDEPENDENT_AMBULATORY_CARE_PROVIDER_SITE_OTHER): Payer: BC Managed Care – PPO

## 2020-01-09 DIAGNOSIS — E611 Iron deficiency: Secondary | ICD-10-CM

## 2020-01-09 DIAGNOSIS — Z0000 Encounter for general adult medical examination without abnormal findings: Secondary | ICD-10-CM

## 2020-01-09 DIAGNOSIS — E119 Type 2 diabetes mellitus without complications: Secondary | ICD-10-CM | POA: Diagnosis not present

## 2020-01-09 DIAGNOSIS — E559 Vitamin D deficiency, unspecified: Secondary | ICD-10-CM | POA: Diagnosis not present

## 2020-01-09 DIAGNOSIS — E538 Deficiency of other specified B group vitamins: Secondary | ICD-10-CM | POA: Diagnosis not present

## 2020-01-09 LAB — MICROALBUMIN / CREATININE URINE RATIO
Creatinine,U: 226 mg/dL
Microalb Creat Ratio: 0.9 mg/g (ref 0.0–30.0)
Microalb, Ur: 2 mg/dL — ABNORMAL HIGH (ref 0.0–1.9)

## 2020-01-09 LAB — HEPATIC FUNCTION PANEL
ALT: 29 U/L (ref 0–35)
AST: 22 U/L (ref 0–37)
Albumin: 4.1 g/dL (ref 3.5–5.2)
Alkaline Phosphatase: 62 U/L (ref 39–117)
Bilirubin, Direct: 0.2 mg/dL (ref 0.0–0.3)
Total Bilirubin: 0.7 mg/dL (ref 0.2–1.2)
Total Protein: 7.1 g/dL (ref 6.0–8.3)

## 2020-01-09 LAB — CBC WITH DIFFERENTIAL/PLATELET
Basophils Absolute: 0.1 10*3/uL (ref 0.0–0.1)
Basophils Relative: 1.1 % (ref 0.0–3.0)
Eosinophils Absolute: 0.1 10*3/uL (ref 0.0–0.7)
Eosinophils Relative: 1 % (ref 0.0–5.0)
HCT: 43.9 % (ref 36.0–46.0)
Hemoglobin: 14.7 g/dL (ref 12.0–15.0)
Lymphocytes Relative: 27.8 % (ref 12.0–46.0)
Lymphs Abs: 1.8 10*3/uL (ref 0.7–4.0)
MCHC: 33.5 g/dL (ref 30.0–36.0)
MCV: 87.9 fl (ref 78.0–100.0)
Monocytes Absolute: 0.5 10*3/uL (ref 0.1–1.0)
Monocytes Relative: 8.1 % (ref 3.0–12.0)
Neutro Abs: 4 10*3/uL (ref 1.4–7.7)
Neutrophils Relative %: 62 % (ref 43.0–77.0)
Platelets: 278 10*3/uL (ref 150.0–400.0)
RBC: 4.99 Mil/uL (ref 3.87–5.11)
RDW: 13.6 % (ref 11.5–15.5)
WBC: 6.5 10*3/uL (ref 4.0–10.5)

## 2020-01-09 LAB — BASIC METABOLIC PANEL
BUN: 16 mg/dL (ref 6–23)
CO2: 24 mEq/L (ref 19–32)
Calcium: 9.7 mg/dL (ref 8.4–10.5)
Chloride: 108 mEq/L (ref 96–112)
Creatinine, Ser: 0.72 mg/dL (ref 0.40–1.20)
GFR: 82.1 mL/min (ref >60.00)
Glucose, Bld: 105 mg/dL — ABNORMAL HIGH (ref 70–99)
Potassium: 4.2 mEq/L (ref 3.5–5.1)
Sodium: 140 mEq/L (ref 135–145)

## 2020-01-09 LAB — URINALYSIS, ROUTINE W REFLEX MICROSCOPIC
Bilirubin Urine: NEGATIVE
Hgb urine dipstick: NEGATIVE
Ketones, ur: NEGATIVE
Leukocytes,Ua: NEGATIVE
Nitrite: NEGATIVE
RBC / HPF: NONE SEEN (ref 0–?)
Specific Gravity, Urine: 1.025 (ref 1.000–1.030)
Urine Glucose: NEGATIVE
Urobilinogen, UA: 0.2 (ref 0.0–1.0)
pH: 6 (ref 5.0–8.0)

## 2020-01-09 LAB — LIPID PANEL
Cholesterol: 134 mg/dL (ref 0–200)
HDL: 33.5 mg/dL — ABNORMAL LOW (ref >39.00)
LDL Cholesterol: 70 mg/dL (ref 0–99)
NonHDL: 100.06
Total CHOL/HDL Ratio: 4
Triglycerides: 151 mg/dL — ABNORMAL HIGH (ref 0.0–149.0)
VLDL: 30.2 mg/dL (ref 0.0–40.0)

## 2020-01-09 LAB — TSH: TSH: 0.3 u[IU]/mL — ABNORMAL LOW (ref 0.35–4.50)

## 2020-01-09 LAB — IBC PANEL
Iron: 82 ug/dL (ref 42–145)
Saturation Ratios: 30.7 % (ref 20.0–50.0)
Transferrin: 191 mg/dL — ABNORMAL LOW (ref 212.0–360.0)

## 2020-01-09 LAB — VITAMIN B12: Vitamin B-12: 535 pg/mL (ref 211–911)

## 2020-01-09 LAB — VITAMIN D 25 HYDROXY (VIT D DEFICIENCY, FRACTURES): VITD: 27.45 ng/mL — ABNORMAL LOW (ref 30.00–100.00)

## 2020-01-09 LAB — HEMOGLOBIN A1C: Hgb A1c MFr Bld: 6.6 % — ABNORMAL HIGH (ref 4.6–6.5)

## 2020-01-12 ENCOUNTER — Encounter: Payer: Self-pay | Admitting: Internal Medicine

## 2020-01-16 ENCOUNTER — Other Ambulatory Visit: Payer: Self-pay

## 2020-01-16 ENCOUNTER — Encounter: Payer: Self-pay | Admitting: Adult Health

## 2020-01-16 ENCOUNTER — Encounter: Payer: Self-pay | Admitting: Internal Medicine

## 2020-01-16 ENCOUNTER — Ambulatory Visit (INDEPENDENT_AMBULATORY_CARE_PROVIDER_SITE_OTHER): Payer: BC Managed Care – PPO | Admitting: Adult Health

## 2020-01-16 DIAGNOSIS — G4733 Obstructive sleep apnea (adult) (pediatric): Secondary | ICD-10-CM

## 2020-01-16 DIAGNOSIS — J4520 Mild intermittent asthma, uncomplicated: Secondary | ICD-10-CM

## 2020-01-16 NOTE — Progress Notes (Signed)
Virtual Visit via Telephone Note  I connected with Tanya [name] on 01/16/20 at  4:15 PM EST by telephone and verified that I am speaking with the correct person using two identifiers.  Location: Patient: home  Provider: office    I discussed the limitations, risks, security and privacy concerns of performing an evaluation and management service by telephone and the availability of in person appointments. I also discussed with the patient that there may be a patient responsible charge related to this service. The patient expressed understanding and agreed to proceed.   History of Present Illness: 64 year old female former smoker followed for obstructive sleep apnea.  Patient has some mild intermittent asthma. Moved from [place] in 2000 diagnosed with severe sleep apnea in [place] is a 1 year follow-up for obstructive sleep apnea and asthma She says overall she is doing well on her CPAP.  She wears it each night.  She feels rested with no significant daytime sleepiness and feels that she benefits from CPAP. CPAP download shows excellent compliance with daily average usage at 8.5 hours.  Patient is on auto CPAP 5 to 15 cm H2O.  Average daily pressure at 13 cm H2O.  AHI of 1.0  Patient has some asthma and allergic rhinitis.  Patient says overall breathing is doing well.  She uses albuterol on as-needed basis.  She has rare use of this., (no use in 78yrs) . She is on Zyrtec and Singulair.  Had Covid 3 weeks ago. Had minimal symptoms. Husband was admitted, now home doing better.     Patient Active Problem List   Diagnosis Date Noted  . Dizziness 08/12/2017  . Chronic pain 02/10/2017  . Primary osteoarthritis of right hip 09/12/2016  . Osteoarthritis of right hip 09/12/2016  . S/P laparoscopic hysterectomy 06/02/2016  . Right cervical radiculopathy 02/08/2016  . Melanoma of multiple sites ([place]) 02/03/2014  . Right knee pain 02/03/2014  . 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  Hearing loss 01/21/2013  . Hemifacial spasm 09/18/2011  . Diabetes ([place]) 03/16/2011  . Preventative health care 03/16/2011  . Allergic rhinitis 01/11/2008  . Obstructive sleep apnea 09/07/2007  . Essential hypertension 09/07/2007  . Asthma 09/07/2007  . Osteoarthritis 09/07/2007   Current Outpatient Medications on File Prior to Visit  Medication Sig Dispense Refill  . albuterol (PROVENTIL HFA;VENTOLIN HFA) 108 (90 Base) MCG/ACT inhaler Inhale 2 puffs into the lungs every 6 (six) hours as needed for wheezing or shortness of breath. 1 Inhaler 5  . amLODipine (NORVASC) 10 MG tablet TAKE 1 TABLET BY MOUTH EVERY DAY IN THE MORNING 90 tablet 1  . aspirin 81 MG tablet Take 1 tablet (81 mg total) by mouth 2 (two) times daily after a meal. 60 tablet 1  . calcium gluconate 500 MG tablet Take 1 tablet (500 mg total) by mouth daily. Annual appt due in Feb must see provider for future refills 30 tablet 0  . celecoxib (CELEBREX) 200 MG capsule TAKE ONE CAP BY MOUTH 2 TIMES DAILY AS NEEDED. 60 capsule 0  . cetirizine (ZYRTEC) 10 MG tablet TAKE 1 TABLET (10 MG TOTAL) BY MOUTH DAILY. 90 tablet 3  . glipiZIDE (GLUCOTROL XL) 2.5 MG 24 hr tablet Take 1 tablet (2.5 mg total) by mouth daily with breakfast. Annual appt due in Febuary must see provider for future refills 30 tablet 0  . Glucosamine-Chondroit-Vit C-Mn (GLUCOSAMINE 1500 COMPLEX PO) Take 1 tablet by mouth 2 (two) times daily.     Marland Kitchen  irbesartan (AVAPRO) 150 MG tablet TAKE 1 TABLET BY MOUTH EVERY DAY 90 tablet 0  . irbesartan (AVAPRO) 300 MG tablet Take 1 tablet (300 mg total) by mouth daily. 90 tablet 3  . methocarbamol (ROBAXIN) 500 MG tablet Take 1 tablet (500 mg total) by mouth 4 (four) times daily. 90 tablet 3  . montelukast (SINGULAIR) 10 MG tablet TAKE 1 TABLET BY MOUTH EVERY DAY 90 tablet 3  . Multiple Vitamin (MULTIVITAMIN WITH MINERALS) TABS tablet Take 1 tablet by mouth daily.    Marland Kitchen tolterodine (DETROL LA) 4 MG 24 hr capsule Take 1 capsule (4 mg  total) by mouth daily. 90 capsule 3  . traMADol (ULTRAM-ER) 200 MG 24 hr tablet Take 1 tablet (200 mg total) by mouth daily. 30 tablet 5   No current facility-administered medications on file prior to visit.     Observations/Objective: Speaks in full sentences with no apparent distress.  Assessment and Plan: Obstructive sleep apnea with excellent control and compliance on CPAP.  Mild intermittent asthma allergic rhinitis well-controlled on current regimen  Covid 19 - mild case , fully recovered.   Plan  Patient Instructions  Continue on CPAP at bedtime Keep up the good work Work on healthy weight Do not drive if sleepy Continue on Zyrtec daily as needed for drainage Continue on Singulair daily Follow-up with Dr. Elsworth Soho in 1 year and as needed      Follow Up Instructions: Follow up in 1 year and As needed      I discussed the assessment and treatment plan with the patient. The patient was provided an opportunity to ask questions and all were answered. The patient agreed with the plan and demonstrated an understanding of the instructions.   The patient was advised to call back or seek an in-person evaluation if the symptoms worsen or if the condition fails to improve as anticipated.  I provided 21  minutes of non-face-to-face time during this encounter.   Rexene Edison, NP

## 2020-01-16 NOTE — Patient Instructions (Signed)
Continue on CPAP at bedtime Keep up the good work Work on [employer] Do not drive if sleepy Continue on Zyrtec daily as needed for drainage Continue on Singulair daily Follow-up with Dr. Elsworth Soho in 1 year and as needed

## 2020-01-27 ENCOUNTER — Other Ambulatory Visit: Payer: Self-pay

## 2020-01-27 ENCOUNTER — Encounter: Payer: Self-pay | Admitting: Internal Medicine

## 2020-01-27 ENCOUNTER — Ambulatory Visit: Payer: BC Managed Care – PPO | Admitting: Internal Medicine

## 2020-01-27 VITALS — BP 126/82 | HR 102 | Temp 98.4°F | Ht 67.0 in | Wt 248.0 lb

## 2020-01-27 DIAGNOSIS — E119 Type 2 diabetes mellitus without complications: Secondary | ICD-10-CM | POA: Diagnosis not present

## 2020-01-27 DIAGNOSIS — Z0000 Encounter for general adult medical examination without abnormal findings: Secondary | ICD-10-CM | POA: Diagnosis not present

## 2020-01-27 NOTE — Patient Instructions (Signed)

## 2020-01-27 NOTE — Progress Notes (Addendum)
Subjective:    Patient ID: Tanya [name], female    DOB: Apr 16, 1956, 64 y.o.   MRN: [id number]  HPI  Here for wellness and f/u;  Overall doing ok;  Pt denies Chest pain, worsening SOB, DOE, wheezing, orthopnea, PND, worsening LE edema, palpitations, dizziness or syncope.  Pt denies neurological change such as new headache, facial or extremity weakness.  Pt denies polydipsia, polyuria, or low sugar symptoms. Pt states overall good compliance with treatment and medications, good tolerability, and has been trying to follow appropriate diet.  Pt denies worsening depressive symptoms, suicidal ideation or panic. No fever, night sweats, wt loss, loss of appetite, or other constitutional symptoms.  Pt states good ability with ADL's, has low fall risk, home safety reviewed and adequate, no other significant changes in hearing or vision, and only occasionally active with exercise. S/p recent melanoma top of head scalp #3 removd. 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   S/p COVID infection in feb 2021 Past Medical History:  Diagnosis Date  . Allergic rhinitis   . Blepharospasm 10/09/2010  . Cancer ([facility])    Skin  . Hip pain   . History of melanoma excision    MULTIPLE SITES  . Hypertension   . Mild intermittent asthma   . OA (osteoarthritis)   . PMB (postmenopausal bleeding)   . PONV (postoperative nausea and vomiting)    did not experience after D&C, request to use same medicaitons from that procedure  . Precancerous changes of the cervix   . Sleep apnea    tested in home around 2 yrs ago  . Varicose vein    wears compression stocking   Past Surgical History:  Procedure Laterality Date  . ABDOMINAL HYSTERECTOMY    . COLONOSCOPY    . DILATION AND CURETTAGE OF UTERUS    . ESOPHAGOGASTRODUODENOSCOPY  10/14/2004  . HAMMER TOE SURGERY    . HYSTEROSCOPY WITH D & C N/A 04/07/2016   Procedure: DILATATION AND CURETTAGE /HYSTEROSCOPY WITH  MYOSURE POLYPECTOMY;  Surgeon: Cheri Fowler, MD;  Location: [facility];  Service: Gynecology;  Laterality: N/A;  . JOINT REPLACEMENT    . KNEE ARTHROSCOPY Bilateral left 08-01-2005/  right  . LAPAROSCOPIC HYSTERECTOMY N/A 06/02/2016   Procedure: HYSTERECTOMY TOTAL LAPAROSCOPIC;  Surgeon: Cheri Fowler, MD;  Location: [facility];  Service: Gynecology;  Laterality: N/A;  . LAPAROSCOPIC SALPINGO OOPHERECTOMY Bilateral 06/02/2016   Procedure: LAPAROSCOPIC SALPINGO OOPHORECTOMY;  Surgeon: Cheri Fowler, MD;  Location: [facility];  Service: Gynecology;  Laterality: Bilateral;  . RE-DO ROTATOR CUFF REPAIR/  SAD/  ACROMIOPLASTY Right 01-04-2009  . SHOULDER ARTHROSCOPY W/ ROTATOR CUFF REPAIR Right   . tailor bunion removal    . TONSILLECTOMY    . TOTAL HIP ARTHROPLASTY Right 09/12/2016   Procedure: RIGHT TOTAL HIP ARTHROPLASTY ANTERIOR APPROACH;  Surgeon: Rod Can, MD;  Location: [place];  Service: Orthopedics;  Laterality: Right;  . TOTAL HIP ARTHROPLASTY Right 10/01/2016   Procedure: IRRIGATION AND DEBRIDEMENT RIGHT HIP;  Surgeon: Rod Can, MD;  Location: [place];  Service: Orthopedics;  Laterality: Right;  . TOTAL KNEE ARTHROPLASTY Left 12/29/2013   Procedure: LEFT TOTAL KNEE ARTHROPLASTY;  Surgeon: Johnn Hai, MD;  Location: [facility];  Service: Orthopedics;  Laterality: Left;    reports that she quit smoking about 32 years ago. Her smoking use included cigarettes. She has a 20.00 pack-year smoking history. She has never used smokeless tobacco. She reports current alcohol use  of about 2.0 standard drinks of alcohol per week. She reports that she does not use drugs. family history includes Asthma in an other family member; Diabetes in an other family member; Esophageal cancer in her father; Melanoma in her sister; Ovarian cancer in an other family member. Allergies  Allergen Reactions  . Ace Inhibitors Hives  . Amoxicillin Hives  . Other Hives    Nucafed  . Penicillins Hives     Has patient had a PCN reaction causing immediate rash, facial/tongue/throat swelling, SOB or lightheadedness with hypotension: Yes Has patient had a PCN reaction causing severe rash involving mucus membranes or skin necrosis: No Has patient had a PCN reaction that required hospitalization No Has patient had a PCN reaction occurring within the last 10 years: No If all of the above answers are "NO", then may proceed with Cephalosporin use.   . Metformin And Related Other (See Comments)    GI upset  . Pseudoephedrine Hives  . Pseudoephedrine-Codeine Hives   Current Outpatient Medications on File Prior to Visit  Medication Sig Dispense Refill  . aspirin 81 MG tablet Take 1 tablet (81 mg total) by mouth 2 (two) times daily after a meal. (Patient taking differently: Take 81 mg by mouth daily. ) 60 tablet 1  . calcium gluconate 500 MG tablet Take 1 tablet (500 mg total) by mouth daily. Annual appt due in Feb must see provider for future refills 30 tablet 0  . Glucosamine-Chondroit-Vit C-Mn (GLUCOSAMINE 1500 COMPLEX PO) Take 1 tablet by mouth 2 (two) times daily.     . Multiple Vitamin (MULTIVITAMIN WITH MINERALS) TABS tablet Take 1 tablet by mouth daily.    . traMADol (ULTRAM-ER) 200 MG 24 hr tablet Take 1 tablet (200 mg total) by mouth daily. 30 tablet 5   No current facility-administered medications on file prior to visit.   Review of Systems All otherwise neg per pt     Objective:   Physical Exam BP 126/82   Pulse (!) 102   Temp 98.4 F (36.9 C)   Ht 5\' 7"  (1.702 m)   Wt 248 lb (112.5 kg)   LMP 09/13/2013   SpO2 99%   BMI 38.84 kg/m  VS noted,  Constitutional: Pt appears in NAD HENT: Head: NCAT.  Right Ear: External ear normal.  Left Ear: External ear normal.  Eyes: . Pupils are equal, round, and reactive to light. Conjunctivae and EOM are normal Nose: without d/c or deformity Neck: Neck supple. Gross normal ROM Cardiovascular: Normal rate and regular rhythm.     Pulmonary/Chest: Effort normal and breath sounds without rales or wheezing.  Abd:  Soft, NT, ND, + BS, no organomegaly Neurological: Pt is alert. At baseline orientation, motor grossly intact Skin: Skin is warm. No rashes, other new lesions, no LE edema Psychiatric: Pt behavior is normal without agitation  All otherwise neg per pt Lab Results  Component Value Date   WBC 6.5 01/09/2020   HGB 14.7 01/09/2020   HCT 43.9 01/09/2020   PLT 278.0 01/09/2020   GLUCOSE 105 (H) 01/09/2020   CHOL 134 01/09/2020   TRIG 151.0 (H) 01/09/2020   HDL 33.50 (L) 01/09/2020   LDLCALC 70 01/09/2020   ALT 29 01/09/2020   AST 22 01/09/2020   NA 140 01/09/2020   K 4.2 01/09/2020   CL 108 01/09/2020   CREATININE 0.72 01/09/2020   BUN 16 01/09/2020   CO2 24 01/09/2020   TSH 0.30 (L) 01/09/2020   INR 0.97 12/23/2013  HGBA1C 6.6 (H) 01/09/2020   MICROALBUR 2.0 (H) 01/09/2020      Assessment & Plan:

## 2020-01-28 ENCOUNTER — Encounter: Payer: Self-pay | Admitting: Internal Medicine

## 2020-01-28 MED ORDER — AMLODIPINE BESYLATE 10 MG PO TABS: ORAL_TABLET | ORAL | 3 refills | Status: DC

## 2020-01-28 MED ORDER — GLIPIZIDE ER 2.5 MG PO TB24: 2.5000 mg | ORAL_TABLET | Freq: Every day | ORAL | 3 refills | Status: DC

## 2020-01-28 MED ORDER — IRBESARTAN 300 MG PO TABS: 300.0000 mg | ORAL_TABLET | Freq: Every day | ORAL | 3 refills | Status: DC

## 2020-01-28 MED ORDER — TOLTERODINE TARTRATE ER 4 MG PO CP24: 4.0000 mg | ORAL_CAPSULE | Freq: Every day | ORAL | 3 refills | Status: DC

## 2020-01-28 MED ORDER — ALBUTEROL SULFATE HFA 108 (90 BASE) MCG/ACT IN AERS: 2.0000 | INHALATION_SPRAY | Freq: Four times a day (QID) | RESPIRATORY_TRACT | 3 refills | Status: DC | PRN

## 2020-01-28 MED ORDER — MONTELUKAST SODIUM 10 MG PO TABS: 10.0000 mg | ORAL_TABLET | Freq: Every day | ORAL | 3 refills | Status: DC

## 2020-01-28 MED ORDER — CELECOXIB 200 MG PO CAPS: ORAL_CAPSULE | ORAL | 3 refills | Status: DC

## 2020-01-28 MED ORDER — METHOCARBAMOL 500 MG PO TABS: 500.0000 mg | ORAL_TABLET | Freq: Four times a day (QID) | ORAL | 3 refills | Status: DC

## 2020-01-28 MED ORDER — CETIRIZINE HCL 10 MG PO TABS: ORAL_TABLET | ORAL | 3 refills | Status: DC

## 2020-01-28 NOTE — Assessment & Plan Note (Signed)

## 2020-01-28 NOTE — Assessment & Plan Note (Signed)
stable overall by history and exam, recent data reviewed with pt, and pt to continue medical treatment as before,  to f/u any worsening symptoms or concerns  

## 2020-02-01 ENCOUNTER — Other Ambulatory Visit: Payer: Self-pay

## 2020-02-01 NOTE — Telephone Encounter (Signed)
New message  The patient's call receives in the mail today's prescription, wondering [facility] [place]   Drug company [employer]   Please call back to discuss.    albuterol (VENTOLIN HFA) 108 (90 Base) MCG/ACT inhaler  glipiZIDE (GLUCOTROL XL) 2.5 MG 24 hr tablet  methocarbamol (ROBAXIN) 500 MG tablet  montelukast (SINGULAIR) 10 MG tablet

## 2020-02-02 MED ORDER — IRBESARTAN 300 MG PO TABS: 300.0000 mg | ORAL_TABLET | Freq: Every day | ORAL | 3 refills | Status: DC

## 2020-02-02 MED ORDER — AMLODIPINE BESYLATE 10 MG PO TABS: ORAL_TABLET | ORAL | 3 refills | Status: DC

## 2020-02-02 MED ORDER — CETIRIZINE HCL 10 MG PO TABS: ORAL_TABLET | ORAL | 3 refills | Status: DC

## 2020-02-02 MED ORDER — MONTELUKAST SODIUM 10 MG PO TABS: 10.0000 mg | ORAL_TABLET | Freq: Every day | ORAL | 3 refills | Status: DC

## 2020-02-02 MED ORDER — ALBUTEROL SULFATE HFA 108 (90 BASE) MCG/ACT IN AERS: 2.0000 | INHALATION_SPRAY | Freq: Four times a day (QID) | RESPIRATORY_TRACT | 3 refills | Status: DC | PRN

## 2020-02-02 MED ORDER — METHOCARBAMOL 500 MG PO TABS: 500.0000 mg | ORAL_TABLET | Freq: Four times a day (QID) | ORAL | 3 refills | Status: DC

## 2020-02-02 MED ORDER — TOLTERODINE TARTRATE ER 4 MG PO CP24: 4.0000 mg | ORAL_CAPSULE | Freq: Every day | ORAL | 3 refills | Status: DC

## 2020-02-02 MED ORDER — CELECOXIB 200 MG PO CAPS: ORAL_CAPSULE | ORAL | 3 refills | Status: DC

## 2020-02-02 MED ORDER — GLIPIZIDE ER 2.5 MG PO TB24: 2.5000 mg | ORAL_TABLET | Freq: Every day | ORAL | 3 refills | Status: DC

## 2020-02-02 NOTE — Telephone Encounter (Signed)
   Patient calling upset, wants to know why medications were sent to [facility] Please call

## 2020-02-02 NOTE — Telephone Encounter (Signed)
LVM with pt informing that the rx's were sent to incorrect pharmacy by mistake. Apologized and I will send the med to the correct pharmacy.   Erx done.

## 2020-02-13 DIAGNOSIS — G5131 Clonic hemifacial spasm, right: Secondary | ICD-10-CM | POA: Diagnosis not present

## 2020-02-21 DIAGNOSIS — X32XXXS Exposure to sunlight, sequela: Secondary | ICD-10-CM | POA: Diagnosis not present

## 2020-02-21 DIAGNOSIS — D2371 Other benign neoplasm of skin of right lower limb, including hip: Secondary | ICD-10-CM | POA: Diagnosis not present

## 2020-02-21 DIAGNOSIS — Z85820 Personal history of malignant melanoma of skin: Secondary | ICD-10-CM | POA: Diagnosis not present

## 2020-02-21 DIAGNOSIS — L814 Other melanin hyperpigmentation: Secondary | ICD-10-CM | POA: Diagnosis not present

## 2020-03-05 ENCOUNTER — Telehealth: Payer: Self-pay | Admitting: Internal Medicine

## 2020-03-05 NOTE — Telephone Encounter (Signed)
New Message:   Pt is calling and states she received her 2nd vaccination on yesterday. She states she has been taking tolterodine (DETROL LA) 4 MG 24 hr capsule for bladder control but she states she has been unable to control her bladder since getting the vaccination on yesterday. She would like to know is there something else that she can take. Please advise.

## 2020-03-06 NOTE — Telephone Encounter (Signed)
Please advise 

## 2020-03-06 NOTE — Telephone Encounter (Signed)
The change in bladder control is highly unlikely to be at all related to the vaccination or the medication, so no need for any change there  Please consider an OV in person with office MD who can check for UTI or other issue?

## 2020-03-07 NOTE — Telephone Encounter (Signed)
Notified pt w/MD response. Pt states by that night she was ok so no need for OV.Marland KitchenJohny [name]

## 2020-04-17 DIAGNOSIS — Z130 Encounter for screening for diseases of the blood and blood-forming organs and certain disorders involving the immune mechanism: Secondary | ICD-10-CM | POA: Diagnosis not present

## 2020-04-17 DIAGNOSIS — Z1231 Encounter for screening mammogram for malignant neoplasm of breast: Secondary | ICD-10-CM | POA: Diagnosis not present

## 2020-04-17 DIAGNOSIS — Z1389 Encounter for screening for other disorder: Secondary | ICD-10-CM | POA: Diagnosis not present

## 2020-04-17 DIAGNOSIS — Z01419 Encounter for gynecological examination (general) (routine) without abnormal findings: Secondary | ICD-10-CM | POA: Diagnosis not present

## 2020-04-17 DIAGNOSIS — Z6839 Body mass index (BMI) 39.0-39.9, adult: Secondary | ICD-10-CM | POA: Diagnosis not present

## 2020-05-01 DIAGNOSIS — G5131 Clonic hemifacial spasm, right: Secondary | ICD-10-CM | POA: Diagnosis not present

## 2020-05-17 ENCOUNTER — Other Ambulatory Visit: Payer: Self-pay | Admitting: Internal Medicine

## 2020-05-29 DIAGNOSIS — L814 Other melanin hyperpigmentation: Secondary | ICD-10-CM | POA: Diagnosis not present

## 2020-05-29 DIAGNOSIS — Z85820 Personal history of malignant melanoma of skin: Secondary | ICD-10-CM | POA: Diagnosis not present

## 2020-05-29 DIAGNOSIS — X32XXXS Exposure to sunlight, sequela: Secondary | ICD-10-CM | POA: Diagnosis not present

## 2020-05-29 DIAGNOSIS — D235 Other benign neoplasm of skin of trunk: Secondary | ICD-10-CM | POA: Diagnosis not present

## 2020-06-14 ENCOUNTER — Other Ambulatory Visit: Payer: Self-pay | Admitting: Internal Medicine

## 2020-06-26 ENCOUNTER — Other Ambulatory Visit: Payer: Self-pay | Admitting: Internal Medicine

## 2020-06-26 ENCOUNTER — Encounter: Payer: Self-pay | Admitting: Internal Medicine

## 2020-06-27 MED ORDER — CALCIUM GLUCONATE 500 MG PO TABS: 1.0000 | ORAL_TABLET | Freq: Every day | ORAL | 0 refills | Status: DC

## 2020-07-14 ENCOUNTER — Other Ambulatory Visit: Payer: Self-pay | Admitting: Internal Medicine

## 2020-07-14 NOTE — Telephone Encounter (Signed)
Please refill as per office routine med refill policy (all routine meds refilled for 3 mo or monthly per pt preference up to one year from last visit, then month to month grace period for 3 mo, then further med refills will have to be denied)  

## 2020-07-17 ENCOUNTER — Other Ambulatory Visit: Payer: Self-pay

## 2020-07-17 MED ORDER — TRAMADOL HCL ER 200 MG PO TB24: 200.0000 mg | ORAL_TABLET | Freq: Every day | ORAL | 5 refills | Status: DC

## 2020-07-17 NOTE — Telephone Encounter (Signed)
New Message:   Pt is calling and states that she will be out of this medication by the weekend and would like to know if another physician can send this medication in for her. Please advise.

## 2020-07-17 NOTE — Telephone Encounter (Signed)
Tramadol sent to [facility]

## 2020-07-17 NOTE — Addendum Note (Signed)
Addended by: Binnie Rail on: 07/17/2020 06:56 PM   Modules accepted: Orders

## 2020-07-19 ENCOUNTER — Telehealth: Payer: Self-pay

## 2020-07-19 NOTE — Telephone Encounter (Signed)
New message    Prior authorization traMADol (ULTRAM-ER) 200 MG 24 hr tablet  [id number] [place], [place] [address]

## 2020-07-21 ENCOUNTER — Other Ambulatory Visit: Payer: Self-pay | Admitting: Internal Medicine

## 2020-07-24 NOTE — Telephone Encounter (Signed)
   Patient calling upset, requesting status of prior auth for Tramadol

## 2020-07-26 MED ORDER — TRAMADOL HCL ER 200 MG PO TB24: 200.0000 mg | ORAL_TABLET | Freq: Every day | ORAL | 5 refills | Status: DC

## 2020-07-26 NOTE — Telephone Encounter (Signed)
Done erx 

## 2020-07-26 NOTE — Telephone Encounter (Signed)
PA has been approved and sent to Dr. Jenny Reichmann to re-send rx to pts pharmacy so it can be filled.

## 2020-07-27 ENCOUNTER — Encounter: Payer: Self-pay | Admitting: Internal Medicine

## 2020-07-31 DIAGNOSIS — N61 Mastitis without abscess: Secondary | ICD-10-CM | POA: Diagnosis not present

## 2020-07-31 DIAGNOSIS — G5131 Clonic hemifacial spasm, right: Secondary | ICD-10-CM | POA: Diagnosis not present

## 2020-08-10 ENCOUNTER — Encounter: Payer: Self-pay | Admitting: Internal Medicine

## 2020-08-10 ENCOUNTER — Ambulatory Visit: Payer: BC Managed Care – PPO | Admitting: Internal Medicine

## 2020-08-10 ENCOUNTER — Other Ambulatory Visit: Payer: Self-pay

## 2020-08-10 VITALS — BP 130/80 | HR 92 | Temp 98.6°F | Ht 67.0 in | Wt 252.0 lb

## 2020-08-10 DIAGNOSIS — E119 Type 2 diabetes mellitus without complications: Secondary | ICD-10-CM | POA: Diagnosis not present

## 2020-08-10 DIAGNOSIS — J4520 Mild intermittent asthma, uncomplicated: Secondary | ICD-10-CM

## 2020-08-10 DIAGNOSIS — Z23 Encounter for immunization: Secondary | ICD-10-CM

## 2020-08-10 DIAGNOSIS — I10 Essential (primary) hypertension: Secondary | ICD-10-CM

## 2020-08-10 NOTE — Progress Notes (Signed)
Subjective:    Patient ID: Tanya [name], female    DOB: January 12, 1956, 64 y.o.   MRN: [id number]  HPI  Here to f/u; overall doing ok,  Pt denies chest pain, increasing sob or doe, wheezing, orthopnea, PND, increased LE swelling, palpitations, dizziness or syncope.  Pt denies new neurological symptoms such as new headache, or facial or extremity weakness or numbness.  Pt denies polydipsia, polyuria, or low sugar episode.  Pt states overall good compliance with meds, mostly trying to follow appropriate diet, with wt overall stable,  but little exercise however. Wt Readings from Last 3 Encounters:  08/10/20 252 lb (114.3 kg)  01/27/20 248 lb (112.5 kg)  07/11/19 247 lb (112 kg)   Past Medical History:  Diagnosis Date   Allergic rhinitis    Blepharospasm 10/09/2010   Cancer (HCC)    Skin   Hip pain    History of melanoma excision    MULTIPLE SITES   Hypertension    Mild intermittent asthma    OA (osteoarthritis)    PMB (postmenopausal bleeding)    PONV (postoperative nausea and vomiting)    did not experience after D&C, request to use same medicaitons from that procedure   Precancerous changes of the cervix    Sleep apnea    tested in home around 2 yrs ago   Varicose vein    wears compression stocking   Past Surgical History:  Procedure Laterality Date   ABDOMINAL HYSTERECTOMY     COLONOSCOPY     DILATION AND CURETTAGE OF UTERUS     ESOPHAGOGASTRODUODENOSCOPY  10/14/2004   HAMMER TOE SURGERY     HYSTEROSCOPY WITH D & C N/A 04/07/2016   Procedure: DILATATION AND CURETTAGE /HYSTEROSCOPY WITH MYOSURE POLYPECTOMY;  Surgeon: Cheri Fowler, MD;  Location: [facility];  Service: Gynecology;  Laterality: N/A;   JOINT REPLACEMENT     KNEE ARTHROSCOPY Bilateral left 08-01-2005/  right   LAPAROSCOPIC HYSTERECTOMY N/A 06/02/2016   Procedure: HYSTERECTOMY TOTAL LAPAROSCOPIC;  Surgeon: Cheri Fowler, MD;  Location: [facility];  Service: Gynecology;   Laterality: N/A;   LAPAROSCOPIC SALPINGO OOPHERECTOMY Bilateral 06/02/2016   Procedure: LAPAROSCOPIC SALPINGO OOPHORECTOMY;  Surgeon: Cheri Fowler, MD;  Location: [facility];  Service: Gynecology;  Laterality: Bilateral;   RE-DO ROTATOR CUFF REPAIR/  SAD/  ACROMIOPLASTY Right 01-04-2009   SHOULDER ARTHROSCOPY W/ ROTATOR CUFF REPAIR Right    tailor bunion removal     TONSILLECTOMY     TOTAL HIP ARTHROPLASTY Right 09/12/2016   Procedure: RIGHT TOTAL HIP ARTHROPLASTY ANTERIOR APPROACH;  Surgeon: Rod Can, MD;  Location: [place];  Service: Orthopedics;  Laterality: Right;   TOTAL HIP ARTHROPLASTY Right 10/01/2016   Procedure: IRRIGATION AND DEBRIDEMENT RIGHT HIP;  Surgeon: Rod Can, MD;  Location: [place];  Service: Orthopedics;  Laterality: Right;   TOTAL KNEE ARTHROPLASTY Left 12/29/2013   Procedure: LEFT TOTAL KNEE ARTHROPLASTY;  Surgeon: Johnn Hai, MD;  Location: [facility];  Service: Orthopedics;  Laterality: Left;    reports that she quit smoking about 32 years ago. Her smoking use included cigarettes. She has a 20.00 pack-year smoking history. She has never used smokeless tobacco. She reports current alcohol use of about 2.0 standard drinks of alcohol per week. She reports that she does not use drugs. family history includes Asthma in an other family member; Diabetes in an other family member; Esophageal cancer in her father; Melanoma in her sister; Ovarian cancer in an other family member. Allergies  Allergen Reactions  Ace Inhibitors Hives   Amoxicillin Hives   Other Hives    Nucafed   Penicillins Hives    Has patient had a PCN reaction causing immediate rash, facial/tongue/throat swelling, SOB or lightheadedness with hypotension: Yes Has patient had a PCN reaction causing severe rash involving mucus membranes or skin necrosis: No Has patient had a PCN reaction that required hospitalization No Has patient had a PCN reaction occurring within the last 10 years: No If  all of the above answers are "NO", then may proceed with Cephalosporin use.    Metformin And Related Other (See Comments)    GI upset   Pseudoephedrine Hives   Pseudoephedrine-Codeine Hives   Current Outpatient Medications on File Prior to Visit  Medication Sig Dispense Refill   albuterol (VENTOLIN HFA) 108 (90 Base) MCG/ACT inhaler Inhale 2 puffs into the lungs every 6 (six) hours as needed for wheezing or shortness of breath. 54 g 3   amLODipine (NORVASC) 10 MG tablet TAKE 1 TABLET BY MOUTH EVERY DAY IN THE MORNING 90 tablet 3   aspirin 81 MG tablet Take 1 tablet (81 mg total) by mouth 2 (two) times daily after a meal. (Patient taking differently: Take 81 mg by mouth daily. ) 60 tablet 1   calcium gluconate 500 MG tablet Take 1 tablet (500 mg total) by mouth daily. 30 tablet 0   celecoxib (CELEBREX) 200 MG capsule TAKE ONE CAP BY MOUTH 2 TIMES DAILY AS NEEDED. 180 capsule 3   cetirizine (ZYRTEC) 10 MG tablet TAKE 1 TABLET (10 MG TOTAL) BY MOUTH DAILY. 90 tablet 3   glipiZIDE (GLUCOTROL XL) 2.5 MG 24 hr tablet Take 1 tablet (2.5 mg total) by mouth daily with breakfast. 90 tablet 3   Glucosamine-Chondroit-Vit C-Mn (GLUCOSAMINE 1500 COMPLEX PO) Take 1 tablet by mouth 2 (two) times daily.      irbesartan (AVAPRO) 300 MG tablet Take 1 tablet (300 mg total) by mouth daily. 90 tablet 3   methocarbamol (ROBAXIN) 500 MG tablet Take 1 tablet (500 mg total) by mouth 4 (four) times daily. 120 tablet 3   montelukast (SINGULAIR) 10 MG tablet Take 1 tablet (10 mg total) by mouth daily. 90 tablet 3   Multiple Vitamin (MULTIVITAMIN WITH MINERALS) TABS tablet Take 1 tablet by mouth daily.     [place] Calcium 500 MG TABS TAKE 1 TABLET BY MOUTH EVERY DAY 30 tablet 11   tolterodine (DETROL LA) 4 MG 24 hr capsule Take 1 capsule (4 mg total) by mouth daily. 90 capsule 3   traMADol (ULTRAM-ER) 200 MG 24 hr tablet Take 1 tablet (200 mg total) by mouth daily. 30 tablet 5   No current  facility-administered medications on file prior to visit.   Review of Systems All otherwise neg per pt    Objective:   Physical Exam BP 130/80 (BP Location: Left Arm, Patient Position: Sitting, Cuff Size: Large)    Pulse 92    Temp 98.6 F (37 C) (Oral)    Ht 5\' 7"  (1.702 m)    Wt 252 lb (114.3 kg)    LMP 09/13/2013    SpO2 98%    BMI 39.47 kg/m  VS noted,  Constitutional: Pt appears in NAD HENT: Head: NCAT.  Right Ear: External ear normal.  Left Ear: External ear normal.  Eyes: . Pupils are equal, round, and reactive to light. Conjunctivae and EOM are normal Nose: without d/c or deformity Neck: Neck supple. Gross normal ROM Cardiovascular: Normal rate and regular rhythm.  Pulmonary/Chest: Effort normal and breath sounds without rales or wheezing.  Abd:  Soft, NT, ND, + BS, no organomegaly Neurological: Pt is alert. At baseline orientation, motor grossly intact Skin: Skin is warm. No rashes, other new lesions, no LE edema Psychiatric: Pt behavior is normal without agitation  All otherwise neg per pt Lab Results  Component Value Date   WBC 6.5 01/09/2020   HGB 14.7 01/09/2020   HCT 43.9 01/09/2020   PLT 278.0 01/09/2020   GLUCOSE 105 (H) 01/09/2020   CHOL 134 01/09/2020   TRIG 151.0 (H) 01/09/2020   HDL 33.50 (L) 01/09/2020   LDLCALC 70 01/09/2020   ALT 29 01/09/2020   AST 22 01/09/2020   NA 140 01/09/2020   K 4.2 01/09/2020   CL 108 01/09/2020   CREATININE 0.72 01/09/2020   BUN 16 01/09/2020   CO2 24 01/09/2020   TSH 0.30 (L) 01/09/2020   INR 0.97 12/23/2013   HGBA1C 6.6 (H) 01/09/2020   MICROALBUR 2.0 (H) 01/09/2020      Assessment & Plan:

## 2020-08-10 NOTE — Patient Instructions (Signed)
Your a1c was 6.3 today  Please continue all other medications as before, and refills have been done if requested.  Please have the pharmacy call with any other refills you may need.  Please continue your efforts at being more active, low cholesterol diet, and weight control  Please keep your appointments with your specialists as you may have planned  Please make an Appointment to return in 6 months, or sooner if needed

## 2020-08-11 ENCOUNTER — Encounter: Payer: Self-pay | Admitting: Internal Medicine

## 2020-08-11 NOTE — Assessment & Plan Note (Signed)
stable overall by history and exam, recent data reviewed with pt, and pt to continue medical treatment as before,  to f/u any worsening symptoms or concerns  

## 2020-08-11 NOTE — Assessment & Plan Note (Signed)

## 2020-08-16 DIAGNOSIS — L720 Epidermal cyst: Secondary | ICD-10-CM | POA: Diagnosis not present

## 2020-08-16 DIAGNOSIS — L089 Local infection of the skin and subcutaneous tissue, unspecified: Secondary | ICD-10-CM | POA: Diagnosis not present

## 2020-08-16 DIAGNOSIS — D485 Neoplasm of uncertain behavior of skin: Secondary | ICD-10-CM | POA: Diagnosis not present

## 2020-08-23 DIAGNOSIS — L720 Epidermal cyst: Secondary | ICD-10-CM | POA: Diagnosis not present

## 2020-10-02 DIAGNOSIS — D2371 Other benign neoplasm of skin of right lower limb, including hip: Secondary | ICD-10-CM | POA: Diagnosis not present

## 2020-10-02 DIAGNOSIS — D235 Other benign neoplasm of skin of trunk: Secondary | ICD-10-CM | POA: Diagnosis not present

## 2020-10-02 DIAGNOSIS — Z85820 Personal history of malignant melanoma of skin: Secondary | ICD-10-CM | POA: Diagnosis not present

## 2020-10-02 DIAGNOSIS — L720 Epidermal cyst: Secondary | ICD-10-CM | POA: Diagnosis not present

## 2020-10-30 DIAGNOSIS — G5131 Clonic hemifacial spasm, right: Secondary | ICD-10-CM | POA: Diagnosis not present

## 2020-12-13 ENCOUNTER — Other Ambulatory Visit: Payer: Self-pay

## 2020-12-13 MED ORDER — MONTELUKAST SODIUM 10 MG PO TABS: 10.0000 mg | ORAL_TABLET | Freq: Every day | ORAL | 3 refills | Status: DC

## 2020-12-13 NOTE — Telephone Encounter (Signed)
Hard copy prescription request for montelukast sod 10mg ... order electronically

## 2020-12-14 ENCOUNTER — Other Ambulatory Visit: Payer: Self-pay

## 2021-01-14 NOTE — Telephone Encounter (Signed)
Encounter opened in error

## 2021-01-16 ENCOUNTER — Other Ambulatory Visit: Payer: Self-pay | Admitting: Internal Medicine

## 2021-01-16 NOTE — Telephone Encounter (Signed)
Ok to contact pt  We receive this med refill request, but it is no longer on her med list  I think we can not refill this unless she thinks we still need this, just let me know

## 2021-01-23 ENCOUNTER — Telehealth: Payer: Self-pay | Admitting: Internal Medicine

## 2021-01-23 NOTE — Telephone Encounter (Signed)
Patient is requesting a short supply of traMADol (ULTRAM-ER) 200 MG 24 hr tablet. [id number] [address], [place]. Her last OV was 9.17.21, her next OV is 3.17.22. Please advise

## 2021-01-24 ENCOUNTER — Other Ambulatory Visit: Payer: Self-pay | Admitting: Internal Medicine

## 2021-01-24 NOTE — Telephone Encounter (Signed)
Patient is requesting a short supply of traMADol (ULTRAM-ER) 200 MG 24 hr tablet. [facility] to [id number] [place], [place]. Her last OV was 9.17.21, her next OV is 3.17.22. Please advise

## 2021-01-25 ENCOUNTER — Other Ambulatory Visit: Payer: Self-pay | Admitting: Internal Medicine

## 2021-01-25 MED ORDER — TRAMADOL HCL ER 200 MG PO TB24: 200.0000 mg | ORAL_TABLET | Freq: Every day | ORAL | 5 refills | Status: DC

## 2021-01-25 NOTE — Telephone Encounter (Signed)
Done erx again - the first time apparently did not go through

## 2021-01-29 ENCOUNTER — Telehealth: Payer: Self-pay

## 2021-01-29 DIAGNOSIS — Z85820 Personal history of malignant melanoma of skin: Secondary | ICD-10-CM | POA: Diagnosis not present

## 2021-01-29 DIAGNOSIS — D2371 Other benign neoplasm of skin of right lower limb, including hip: Secondary | ICD-10-CM | POA: Diagnosis not present

## 2021-01-29 DIAGNOSIS — G5131 Clonic hemifacial spasm, right: Secondary | ICD-10-CM | POA: Diagnosis not present

## 2021-01-29 DIAGNOSIS — D235 Other benign neoplasm of skin of trunk: Secondary | ICD-10-CM | POA: Diagnosis not present

## 2021-01-29 NOTE — Telephone Encounter (Signed)
Tramadol Prior [employer] [id number]

## 2021-02-01 ENCOUNTER — Telehealth: Payer: Self-pay | Admitting: Internal Medicine

## 2021-02-01 MED ORDER — CELECOXIB 200 MG PO CAPS: ORAL_CAPSULE | ORAL | 0 refills | Status: DC

## 2021-02-01 NOTE — Telephone Encounter (Signed)
Patient has called requesting for a short order refill on  celecoxib (CELEBREX) 200 MG capsule Until her appointment with Dr.John to refill the rest

## 2021-02-05 ENCOUNTER — Telehealth: Payer: Self-pay

## 2021-02-05 NOTE — Telephone Encounter (Signed)
Prior Authorization for Celecoxib 200 MG ----Key: [id number]

## 2021-02-06 ENCOUNTER — Other Ambulatory Visit (INDEPENDENT_AMBULATORY_CARE_PROVIDER_SITE_OTHER): Payer: BC Managed Care – PPO

## 2021-02-06 ENCOUNTER — Other Ambulatory Visit: Payer: Self-pay | Admitting: Internal Medicine

## 2021-02-06 DIAGNOSIS — E559 Vitamin D deficiency, unspecified: Secondary | ICD-10-CM

## 2021-02-06 DIAGNOSIS — E538 Deficiency of other specified B group vitamins: Secondary | ICD-10-CM

## 2021-02-06 DIAGNOSIS — E119 Type 2 diabetes mellitus without complications: Secondary | ICD-10-CM

## 2021-02-06 LAB — LIPID PANEL
Cholesterol: 133 mg/dL (ref 0–200)
HDL: 56 mg/dL (ref >39.00)
LDL Cholesterol: 62 mg/dL (ref 0–99)
NonHDL: 76.94
Total CHOL/HDL Ratio: 2
Triglycerides: 73 mg/dL (ref 0.0–149.0)
VLDL: 14.6 mg/dL (ref 0.0–40.0)

## 2021-02-06 LAB — MICROALBUMIN / CREATININE URINE RATIO
Creatinine,U: 121 mg/dL
Microalb Creat Ratio: 0.6 mg/g (ref 0.0–30.0)
Microalb, Ur: <0.7 mg/dL (ref 0.0–1.9)

## 2021-02-06 LAB — BASIC METABOLIC PANEL
BUN: 18 mg/dL (ref 6–23)
CO2: 25 mEq/L (ref 19–32)
Calcium: 9.4 mg/dL (ref 8.4–10.5)
Chloride: 105 mEq/L (ref 96–112)
Creatinine, Ser: 0.72 mg/dL (ref 0.40–1.20)
GFR: 89.25 mL/min (ref >60.00)
Glucose, Bld: 111 mg/dL — ABNORMAL HIGH (ref 70–99)
Potassium: 4 mEq/L (ref 3.5–5.1)
Sodium: 140 mEq/L (ref 135–145)

## 2021-02-06 LAB — CBC WITH DIFFERENTIAL/PLATELET
Basophils Absolute: 0 10*3/uL (ref 0.0–0.1)
Basophils Relative: 0.5 % (ref 0.0–3.0)
Eosinophils Absolute: 0.1 10*3/uL (ref 0.0–0.7)
Eosinophils Relative: 1.7 % (ref 0.0–5.0)
HCT: 42.9 % (ref 36.0–46.0)
Hemoglobin: 14.7 g/dL (ref 12.0–15.0)
Lymphocytes Relative: 33.3 % (ref 12.0–46.0)
Lymphs Abs: 1.9 10*3/uL (ref 0.7–4.0)
MCHC: 34.3 g/dL (ref 30.0–36.0)
MCV: 88.3 fl (ref 78.0–100.0)
Monocytes Absolute: 0.3 10*3/uL (ref 0.1–1.0)
Monocytes Relative: 5.9 % (ref 3.0–12.0)
Neutro Abs: 3.3 10*3/uL (ref 1.4–7.7)
Neutrophils Relative %: 58.6 % (ref 43.0–77.0)
Platelets: 198 10*3/uL (ref 150.0–400.0)
RBC: 4.85 Mil/uL (ref 3.87–5.11)
RDW: 13.9 % (ref 11.5–15.5)
WBC: 5.7 10*3/uL (ref 4.0–10.5)

## 2021-02-06 LAB — URINALYSIS, ROUTINE W REFLEX MICROSCOPIC
Bilirubin Urine: NEGATIVE
Hgb urine dipstick: NEGATIVE
Ketones, ur: NEGATIVE
Leukocytes,Ua: NEGATIVE
Nitrite: NEGATIVE
RBC / HPF: NONE SEEN (ref 0–?)
Specific Gravity, Urine: 1.015 (ref 1.000–1.030)
Total Protein, Urine: NEGATIVE
Urine Glucose: NEGATIVE
Urobilinogen, UA: 0.2 (ref 0.0–1.0)
pH: 6.5 (ref 5.0–8.0)

## 2021-02-06 LAB — HEPATIC FUNCTION PANEL
ALT: 28 U/L (ref 0–35)
AST: 20 U/L (ref 0–37)
Albumin: 4 g/dL (ref 3.5–5.2)
Alkaline Phosphatase: 66 U/L (ref 39–117)
Bilirubin, Direct: 0.1 mg/dL (ref 0.0–0.3)
Total Bilirubin: 0.7 mg/dL (ref 0.2–1.2)
Total Protein: 6.9 g/dL (ref 6.0–8.3)

## 2021-02-06 LAB — VITAMIN D 25 HYDROXY (VIT D DEFICIENCY, FRACTURES): VITD: 54.12 ng/mL (ref 30.00–100.00)

## 2021-02-06 LAB — HEMOGLOBIN A1C: Hgb A1c MFr Bld: 6.3 % (ref 4.6–6.5)

## 2021-02-06 LAB — TSH: TSH: 1.06 u[IU]/mL (ref 0.35–4.50)

## 2021-02-06 LAB — VITAMIN B12: Vitamin B-12: 404 pg/mL (ref 211–911)

## 2021-02-07 ENCOUNTER — Encounter: Payer: Self-pay | Admitting: Internal Medicine

## 2021-02-07 ENCOUNTER — Ambulatory Visit: Payer: BC Managed Care – PPO | Admitting: Internal Medicine

## 2021-02-07 ENCOUNTER — Other Ambulatory Visit: Payer: Self-pay

## 2021-02-07 VITALS — BP 130/78 | HR 97 | Temp 97.9°F | Ht 67.0 in | Wt 246.0 lb

## 2021-02-07 DIAGNOSIS — I10 Essential (primary) hypertension: Secondary | ICD-10-CM | POA: Diagnosis not present

## 2021-02-07 DIAGNOSIS — Z0001 Encounter for general adult medical examination with abnormal findings: Secondary | ICD-10-CM | POA: Diagnosis not present

## 2021-02-07 DIAGNOSIS — E1165 Type 2 diabetes mellitus with hyperglycemia: Secondary | ICD-10-CM

## 2021-02-07 DIAGNOSIS — E559 Vitamin D deficiency, unspecified: Secondary | ICD-10-CM | POA: Diagnosis not present

## 2021-02-07 DIAGNOSIS — Z01818 Encounter for other preprocedural examination: Secondary | ICD-10-CM | POA: Diagnosis not present

## 2021-02-07 DIAGNOSIS — J4520 Mild intermittent asthma, uncomplicated: Secondary | ICD-10-CM | POA: Diagnosis not present

## 2021-02-07 DIAGNOSIS — E669 Obesity, unspecified: Secondary | ICD-10-CM | POA: Insufficient documentation

## 2021-02-07 NOTE — Progress Notes (Signed)
Patient ID: Tanya [name], female   DOB: [birth year], 65 y.o.   MRN: [id number]         Chief Complaint:: wellness exam        HPI:  Tanya [name] is a 65 y.o. female here for wellness exam, to consider having covid booster shot, o/w up to date with preventive referrals and immunizations  Now retired completely, husband to retire soon, plans to get certified for diving.  Also due for f/u Dr Tonita Cong mar 29 then prob right knee TKR after easter.  Pt denies chest pain, increased sob or doe, wheezing, orthopnea, PND, increased LE swelling, palpitations, dizziness or syncope.   Pt denies polydipsia, polyuria, No new focal neuro s/s.   Pt denies fever, wt loss, night sweats, loss of appetite, or other constitutional symptoms  No other new complaints   Wt Readings from Last 3 Encounters:  02/07/21 246 lb (111.6 kg)  08/10/20 252 lb (114.3 kg)  01/27/20 248 lb (112.5 kg)   BP Readings from Last 3 Encounters:  02/07/21 130/78  08/10/20 130/80  01/27/20 126/82   Immunization History  Administered Date(s) Administered  . H1N1 10/30/2008  . Influenza Nasal 08/16/2016  . Influenza Split 08/24/2019  . Influenza Whole 08/24/1998, 07/25/2009, 08/24/2010, 08/24/2012  . Influenza,inj,Quad PF,6+ Mos 10/02/2018, 08/22/2019, 08/10/2020  . Influenza,inj,Quad PF,6-35 Mos 09/06/2015  . Influenza,inj,quad, With Preservative 08/20/2016, 09/12/2017  . Influenza-Unspecified 09/03/2013, 08/22/2016, 09/23/2017, 10/02/2018  . PFIZER Comirnaty(Gray Top)Covid-19 Tri-Sucrose Vaccine 02/12/2020, 03/04/2020  . Pneumococcal Conjugate-13 02/08/2016  . Pneumococcal Polysaccharide-23 01/11/2018  . Td 11/25/1995, 11/24/2005  . Tdap 02/08/2016  . Zoster Recombinat (Shingrix) 04/06/2018, 10/09/2018   There are no preventive care reminders to display for this patient.    Past Medical History:  Diagnosis Date  . Allergic rhinitis   . Blepharospasm 10/09/2010  . Cancer (HCC)    Skin  . Hip pain   . History of melanoma  excision    MULTIPLE SITES  . Hypertension   . Mild intermittent asthma   . OA (osteoarthritis)   . PMB (postmenopausal bleeding)   . PONV (postoperative nausea and vomiting)    did not experience after D&C, request to use same medicaitons from that procedure  . Precancerous changes of the cervix   . Sleep apnea    tested in home around 2 yrs ago  . Varicose vein    wears compression stocking   Past Surgical History:  Procedure Laterality Date  . ABDOMINAL HYSTERECTOMY    . COLONOSCOPY    . DILATION AND CURETTAGE OF UTERUS    . ESOPHAGOGASTRODUODENOSCOPY  10/14/2004  . HAMMER TOE SURGERY    . HYSTEROSCOPY WITH D & C N/A 04/07/2016   Procedure: DILATATION AND CURETTAGE /HYSTEROSCOPY WITH MYOSURE POLYPECTOMY;  Surgeon: Cheri Fowler, MD;  Location: [facility];  Service: Gynecology;  Laterality: N/A;  . JOINT REPLACEMENT    . KNEE ARTHROSCOPY Bilateral left 08-01-2005/  right  . LAPAROSCOPIC HYSTERECTOMY N/A 06/02/2016   Procedure: HYSTERECTOMY TOTAL LAPAROSCOPIC;  Surgeon: Cheri Fowler, MD;  Location: [facility];  Service: Gynecology;  Laterality: N/A;  . LAPAROSCOPIC SALPINGO OOPHERECTOMY Bilateral 06/02/2016   Procedure: LAPAROSCOPIC SALPINGO OOPHORECTOMY;  Surgeon: Cheri Fowler, MD;  Location: [facility];  Service: Gynecology;  Laterality: Bilateral;  . RE-DO ROTATOR CUFF REPAIR/  SAD/  ACROMIOPLASTY Right 01-04-2009  . SHOULDER ARTHROSCOPY W/ ROTATOR CUFF REPAIR Right   . tailor bunion removal    . TONSILLECTOMY    . TOTAL HIP ARTHROPLASTY Right  09/12/2016   Procedure: RIGHT TOTAL HIP ARTHROPLASTY ANTERIOR APPROACH;  Surgeon: Rod Can, MD;  Location: [place];  Service: Orthopedics;  Laterality: Right;  . TOTAL HIP ARTHROPLASTY Right 10/01/2016   Procedure: IRRIGATION AND DEBRIDEMENT RIGHT HIP;  Surgeon: Rod Can, MD;  Location: [place];  Service: Orthopedics;  Laterality: Right;  . TOTAL KNEE ARTHROPLASTY Left 12/29/2013   Procedure: LEFT TOTAL KNEE  ARTHROPLASTY;  Surgeon: Johnn Hai, MD;  Location: [facility];  Service: Orthopedics;  Laterality: Left;    reports that she quit smoking about 33 years ago. Her smoking use included cigarettes. She has a 20.00 pack-year smoking history. She has never used smokeless tobacco. She reports current alcohol use of about 2.0 standard drinks of alcohol per week. She reports that she does not use drugs. family history includes Asthma in an other family member; Diabetes in an other family member; Esophageal cancer in her father; Melanoma in her sister; Ovarian cancer in an other family member. Allergies  Allergen Reactions  . Ace Inhibitors Hives  . Amoxicillin Hives  . Other Hives    Nucafed  . Penicillins Hives    Has patient had a PCN reaction causing immediate rash, facial/tongue/throat swelling, SOB or lightheadedness with hypotension: Yes Has patient had a PCN reaction causing severe rash involving mucus membranes or skin necrosis: No Has patient had a PCN reaction that required hospitalization No Has patient had a PCN reaction occurring within the last 10 years: No If all of the above answers are "NO", then may proceed with Cephalosporin use.   . Metformin And Related Other (See Comments)    GI upset  . Wound Dressing Adhesive Other (See Comments)    Tore her skin  . Pseudoephedrine Hives  . Pseudoephedrine-Codeine Hives   Current Outpatient Medications on File Prior to Visit  Medication Sig Dispense Refill  . albuterol (VENTOLIN HFA) 108 (90 Base) MCG/ACT inhaler Inhale 2 puffs into the lungs every 6 (six) hours as needed for wheezing or shortness of breath. 54 g 3  . amLODipine (NORVASC) 10 MG tablet TAKE 1 TABLET BY MOUTH EVERY DAY IN THE MORNING 90 tablet 3  . aspirin 81 MG tablet Take 1 tablet (81 mg total) by mouth 2 (two) times daily after a meal. (Patient taking differently: Take 81 mg by mouth daily. ) 60 tablet 1  . calcium acetate (PHOSLO) 667 MG capsule calcium  acetate(phosphate binders) 667 mg capsule  TAKE 1 CAPSULE (667 MG TOTAL) BY MOUTH ONCE.    . calcium gluconate 500 MG tablet Take 1 tablet (500 mg total) by mouth daily. 30 tablet 0  . celecoxib (CELEBREX) 200 MG capsule TAKE ONE CAP BY MOUTH 2 TIMES DAILY AS NEEDED. 180 capsule 0  . cetirizine (ZYRTEC) 10 MG tablet TAKE 1 TABLET (10 MG TOTAL) BY MOUTH DAILY. 90 tablet 3  . glipiZIDE (GLUCOTROL XL) 2.5 MG 24 hr tablet Take 1 tablet (2.5 mg total) by mouth daily with breakfast. 90 tablet 3  . Glucosamine-Chondroit-Vit C-Mn (GLUCOSAMINE 1500 COMPLEX PO) Take 1 tablet by mouth 2 (two) times daily.     . Influenza Virus Vacc Split PF 0.5 ML SUSY Afluria 2014-2015(PF) 45 mcg (15 mcg x 3)/0.5 mL intramuscular syringe  TO BE ADMINISTERED BY PHARMACIST FOR IMMUNIZATION    . irbesartan (AVAPRO) 300 MG tablet Take 1 tablet (300 mg total) by mouth daily. 90 tablet 3  . methocarbamol (ROBAXIN) 500 MG tablet Take 1 tablet (500 mg total) by mouth 4 (four) times  daily. 120 tablet 3  . montelukast (SINGULAIR) 10 MG tablet Take 1 tablet (10 mg total) by mouth daily. 90 tablet 3  . Multiple Vitamin (MULTIVITAMIN WITH MINERALS) TABS tablet Take 1 tablet by mouth daily.    [place] Calcium 500 MG TABS TAKE 1 TABLET BY MOUTH EVERY DAY 30 tablet 11  . tolterodine (DETROL LA) 4 MG 24 hr capsule Take 1 capsule (4 mg total) by mouth daily. 90 capsule 3  . traMADol (ULTRAM-ER) 200 MG 24 hr tablet Take 1 tablet (200 mg total) by mouth daily. 30 tablet 5   No current facility-administered medications on file prior to visit.        ROS:  All others reviewed and negative.  Objective        PE:  BP 130/78   Pulse 97   Temp 97.9 F (36.6 C) (Oral)   Ht 5\' 7"  (1.702 m)   Wt 246 lb (111.6 kg)   LMP 09/13/2013   SpO2 97%   BMI 38.53 kg/m                 Constitutional: Pt appears in NAD               HENT: Head: NCAT.                Right Ear: External ear normal.                 Left Ear: External ear  normal.                Eyes: . Pupils are equal, round, and reactive to light. Conjunctivae and EOM are normal               Nose: without d/c or deformity               Neck: Neck supple. Gross normal ROM               Cardiovascular: Normal rate and regular rhythm.                 Pulmonary/Chest: Effort normal and breath sounds without rales or wheezing.                Abd:  Soft, NT, ND, + BS, no organomegaly               Neurological: Pt is alert. At baseline orientation, motor grossly intact               Skin: Skin is warm. No rashes, no other new lesions, LE edema - none               Psychiatric: Pt behavior is normal without agitation   Micro: none  Cardiac tracings I have personally interpreted today:  none  Pertinent Radiological findings (summarize): none   Lab Results  Component Value Date   WBC 5.7 02/06/2021   HGB 14.7 02/06/2021   HCT 42.9 02/06/2021   PLT 198.0 02/06/2021   GLUCOSE 111 (H) 02/06/2021   CHOL 133 02/06/2021   TRIG 73.0 02/06/2021   HDL 56.00 02/06/2021   LDLCALC 62 02/06/2021   ALT 28 02/06/2021   AST 20 02/06/2021   NA 140 02/06/2021   K 4.0 02/06/2021   CL 105 02/06/2021   CREATININE 0.72 02/06/2021   BUN 18 02/06/2021   CO2 25 02/06/2021   TSH 1.06 02/06/2021   INR 0.97 12/23/2013   HGBA1C 6.3 02/06/2021  MICROALBUR <0.7 02/06/2021   Assessment/Plan:  Tanya [name] is a 65 y.o. White or Caucasian [1] female with  has a past medical history of Allergic rhinitis, Blepharospasm (10/09/2010), Cancer [facility]), Hip pain, History of melanoma excision, Hypertension, Mild intermittent asthma, OA (osteoarthritis), PMB (postmenopausal bleeding), PONV (postoperative nausea and vomiting), Precancerous changes of the cervix, Sleep apnea, and Varicose vein.  Encounter for well adult exam with abnormal findings Age and sex appropriate education and counseling updated with regular exercise and diet Referrals for preventative services - none  needed Immunizations addressed - none needed Smoking counseling  - none needed Evidence for depression or other mood disorder - none significant Most recent labs reviewed. I have personally reviewed and have noted: 1) the patient's medical and social history 2) The patient's current medications and supplements 3) The patient's height, weight, and BMI have been recorded in the chart   Asthma Stable, to cont current med tx - singulair, albuterol hfa prn   Diabetes mellitus ([place]) Lab Results  Component Value Date   HGBA1C 6.3 02/06/2021   Stable, pt to continue current medical treatment - glucotrol xl    Hypertensive disorder BP Readings from Last 3 Encounters:  02/07/21 130/78  08/10/20 130/80  01/27/20 126/82   Stable, pt to continue medical treatment norvasc, avapro   Preop exam for internal medicine Overall doing well, pt is cleared for proposed right knee surgury as planned  Followup: Return in about 6 months (around 08/10/2021).  Cathlean Cower, MD 02/10/2021 5:24 AM [facility]

## 2021-02-07 NOTE — Patient Instructions (Addendum)
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  Please have the pharmacy call with any other refills you may need.  Please continue your efforts at being more active, low cholesterol diet, and weight control.  You are otherwise up to date with prevention measures today.  Please keep your appointments with your specialists as you may have planned  Your form is signed today  Please make an Appointment to return in 6 months, or sooner if needed  Good Luck with your right knee surgury AND the Diving!

## 2021-02-10 ENCOUNTER — Encounter: Payer: Self-pay | Admitting: Internal Medicine

## 2021-02-10 DIAGNOSIS — Z01818 Encounter for other preprocedural examination: Secondary | ICD-10-CM | POA: Insufficient documentation

## 2021-02-10 NOTE — Assessment & Plan Note (Signed)
Lab Results  Component Value Date   HGBA1C 6.3 02/06/2021   Stable, pt to continue current medical treatment - glucotrol xl

## 2021-02-10 NOTE — Assessment & Plan Note (Signed)

## 2021-02-10 NOTE — Assessment & Plan Note (Signed)
BP Readings from Last 3 Encounters:  02/07/21 130/78  08/10/20 130/80  01/27/20 126/82   Stable, pt to continue medical treatment norvasc, avapro

## 2021-02-10 NOTE — Assessment & Plan Note (Signed)
Stable, to cont current med tx - singulair, albuterol hfa prn

## 2021-02-10 NOTE — Assessment & Plan Note (Signed)
Overall doing well, pt is cleared for proposed right knee surgury as planned

## 2021-02-13 DIAGNOSIS — G4733 Obstructive sleep apnea (adult) (pediatric): Secondary | ICD-10-CM | POA: Diagnosis not present

## 2021-02-19 DIAGNOSIS — M25561 Pain in right knee: Secondary | ICD-10-CM | POA: Diagnosis not present

## 2021-02-22 ENCOUNTER — Other Ambulatory Visit: Payer: Self-pay | Admitting: Internal Medicine

## 2021-02-25 ENCOUNTER — Other Ambulatory Visit: Payer: Self-pay | Admitting: Internal Medicine

## 2021-02-25 MED ORDER — CELECOXIB 200 MG PO CAPS: ORAL_CAPSULE | ORAL | 3 refills | Status: DC

## 2021-02-27 ENCOUNTER — Telehealth: Payer: Self-pay | Admitting: Internal Medicine

## 2021-02-27 NOTE — Telephone Encounter (Signed)
Patient calling, states her ortho doctor faxed over surgical clearance on 03.29.22 and they are faxing it again. She wanted to make sure this gets completed

## 2021-03-01 NOTE — Telephone Encounter (Signed)
Yes it was done once already and I think given hardcopy back to [facility]

## 2021-03-13 ENCOUNTER — Ambulatory Visit: Payer: Self-pay | Admitting: Orthopedic Surgery

## 2021-03-21 NOTE — Patient Instructions (Addendum)
DUE TO COVID-19 ONLY ONE VISITOR IS ALLOWED TO COME WITH YOU AND STAY IN THE WAITING ROOM ONLY DURING PRE OP AND PROCEDURE DAY OF SURGERY. THE 1 VISITOR  MAY VISIT WITH YOU AFTER SURGERY IN YOUR PRIVATE ROOM DURING VISITING HOURS ONLY!  YOU NEED TO HAVE A COVID 19 TEST ON: 04/09/21 @ 2:30 PM  , THIS TEST MUST BE DONE BEFORE SURGERY,  COVID TESTING SITE [place] [id number], IT IS ON THE RIGHT GOING OUT [address] APPROXIMATELY  2 MINUTES PAST [employer] ON THE RIGHT. ONCE YOUR COVID TEST IS COMPLETED,  PLEASE BEGIN THE QUARANTINE INSTRUCTIONS AS OUTLINED IN YOUR HANDOUT.                Tanya [name]    Your procedure is scheduled on: 04/12/21   Report to [facility] Main  Entrance   Report to admitting at: 10:45 AM     Call this number if you have problems the morning of surgery [phone]    Remember:  NO SOLID FOOD AFTER MIDNIGHT THE NIGHT PRIOR TO SURGERY. NOTHING BY MOUTH EXCEPT CLEAR LIQUIDS UNTIL: 10:15 AM . PLEASE FINISH ENSURE DRINK PER SURGEON ORDER  WHICH NEEDS TO BE COMPLETED AT: 10:15 AM .  CLEAR LIQUID DIET  Foods Allowed                                                                     Foods Excluded  Coffee and tea, regular and decaf                             liquids that you cannot  Plain Jell-O any favor except red or purple                                           see through such as: Fruit ices (not with fruit pulp)                                     milk, soups, orange juice  Iced Popsicles                                    All solid food Carbonated beverages, regular and diet                                    Cranberry, grape and apple juices Sports drinks like Gatorade Lightly seasoned clear broth or consume(fat free) Sugar, honey syrup  Sample Menu Breakfast                                Lunch  Supper Cranberry juice                    Beef broth                             Chicken broth Jell-O                                     Grape juice                           Apple juice Coffee or tea                        Jell-O                                      Popsicle                                                Coffee or tea                        Coffee or tea  _____________________________________________________________________  BRUSH YOUR TEETH MORNING OF SURGERY AND RINSE YOUR MOUTH OUT, NO CHEWING GUM CANDY OR MINTS.    Take these medicines the morning of surgery with A SIP OF WATER:  Cetirizine,amlodipine,tolterodine.Use inhalers as usual.  How to Manage Your Diabetes Before and After Surgery  Why is it important to control my blood sugar before and after surgery? . Improving blood sugar levels before and after surgery helps healing and can limit problems. . A way of improving blood sugar control is eating a healthy diet by: o  Eating less sugar and carbohydrates o  Increasing activity/exercise o  Talking with your doctor about reaching your blood sugar goals . High blood sugars (greater than 180 mg/dL) can raise your risk of infections and slow your recovery, so you will need to focus on controlling your diabetes during the weeks before surgery. . Make sure that the doctor who takes care of your diabetes knows about your planned surgery including the date and location.  How do I manage my blood sugar before surgery? . Check your blood sugar at least 4 times a day, starting 2 days before surgery, to make sure that the level is not too high or low. o Check your blood sugar the morning of your surgery when you wake up and every 2 hours until you get to the Short Stay unit. . If your blood sugar is less than 70 mg/dL, you will need to treat for low blood sugar: o Do not take insulin. o Treat a low blood sugar (less than 70 mg/dL) with  cup of clear juice (cranberry or apple), 4 glucose tablets, OR glucose gel. o Recheck blood sugar in 15 minutes  after treatment (to make sure it is greater than 70 mg/dL). If your blood sugar is not greater than 70 mg/dL on recheck, call [phone] for further instructions. . Report your blood sugar to the short stay nurse when you get to Short Stay.  Marland Kitchen  If you are admitted to the hospital after surgery: o Your blood sugar will be checked by the staff and you will probably be given insulin after surgery (instead of oral diabetes medicines) to make sure you have good blood sugar levels. o The goal for blood sugar control after surgery is 80-180 mg/dL.   WHAT DO I DO ABOUT MY DIABETES MEDICATION?  Marland Kitchen Do not take oral diabetes medicines (pills) the morning of surgery.  . THE  BEFORE SURGERY, take glipizide in the morning.     THE MORNING OF SURGERY, DO NOT TAKE ANY ORAL DIABETIC MEDICATIONS DAY OF YOUR SURGERY                               You may not have any metal on your body including hair pins and              piercings  Do not wear jewelry, make-up, lotions, powders or perfumes, deodorant             Do not wear nail polish on your fingernails.  Do not shave  48 hours prior to surgery.    Do not bring valuables to the hospital. [place].  Contacts, dentures or bridgework may not be worn into surgery.  Leave suitcase in the car. After surgery it may be brought to your room.     Patients discharged the day of surgery will not be allowed to drive home. IF YOU ARE HAVING SURGERY AND GOING HOME THE SAME DAY, YOU MUST HAVE AN ADULT TO DRIVE YOU HOME AND BE WITH YOU FOR 24 HOURS. YOU MAY GO HOME BY TAXI OR UBER OR ORTHERWISE, BUT AN ADULT MUST ACCOMPANY YOU HOME AND STAY WITH YOU FOR 24 HOURS.  Name and phone number of your driver:  Special Instructions: N/A              Please read over the following fact sheets you were given: _____________________________________________________________________   PLEASE BRING CPAP MASK [place]. DEVICE  WILL BE PROVIDED!       [facility] - Preparing for Surgery Before surgery, you can play an important role.  Because skin is not sterile, your skin needs to be as free of germs as possible.  You can reduce the number of germs on your skin by washing with CHG (chlorahexidine gluconate) soap before surgery.  CHG is an antiseptic cleaner which kills germs and bonds with the skin to continue killing germs even after washing. Please DO NOT use if you have an allergy to CHG or antibacterial soaps.  If your skin becomes reddened/irritated stop using the CHG and inform your nurse when you arrive at Short Stay. Do not shave (including legs and underarms) for at least 48 hours prior to the first CHG shower.  You may shave your face/neck. Please follow these instructions carefully:  1.  Shower with CHG Soap the night before surgery and the  morning of Surgery.  2.  If you choose to wash your hair, wash your hair first as usual with your  normal  shampoo.  3.  After you shampoo, rinse your hair and body thoroughly to remove the  shampoo.  4.  Use CHG as you would any other liquid soap.  You can apply chg directly  to the skin and wash                       Gently with a scrungie or clean washcloth.  5.  Apply the CHG Soap to your body ONLY FROM THE NECK DOWN.   Do not use on face/ open                           Wound or open sores. Avoid contact with eyes, ears mouth and genitals (private parts).                       Wash face,  Genitals (private parts) with your normal soap.             6.  Wash thoroughly, paying special attention to the area where your surgery  will be performed.  7.  Thoroughly rinse your body with warm water from the neck down.  8.  DO NOT shower/wash with your normal soap after using and rinsing off  the CHG Soap.                9.  Pat yourself dry with a clean towel.            10.  Wear clean pajamas.            11.  Place clean sheets on your bed the night  of your first shower and do not  sleep with pets. Day of Surgery : Do not apply any lotions/deodorants the morning of surgery.  Please wear clean clothes to the hospital/surgery center.  FAILURE TO FOLLOW THESE INSTRUCTIONS MAY RESULT IN THE CANCELLATION OF YOUR SURGERY PATIENT SIGNATURE_________________________________  NURSE SIGNATURE__________________________________  ________________________________________________________________________   Adam Phenix  An incentive spirometer is a tool that can help keep your lungs clear and active. This tool measures how well you are filling your lungs with each breath. Taking long deep breaths may help reverse or decrease the chance of developing breathing (pulmonary) problems (especially infection) following:  A long period of time when you are unable to move or be active. BEFORE THE PROCEDURE   If the spirometer includes an indicator to show your best effort, your nurse or respiratory therapist will set it to a desired goal.  If possible, sit up straight or lean slightly forward. Try not to slouch.  Hold the incentive spirometer in an upright position. INSTRUCTIONS FOR USE  1. Sit on the edge of your bed if possible, or sit up as far as you can in bed or on a chair. 2. Hold the incentive spirometer in an upright position. 3. Breathe out normally. 4. Place the mouthpiece in your mouth and seal your lips tightly around it. 5. Breathe in slowly and as deeply as possible, raising the piston or the ball toward the top of the column. 6. Hold your breath for 3-5 seconds or for as long as possible. Allow the piston or ball to fall to the bottom of the column. 7. Remove the mouthpiece from your mouth and breathe out normally. 8. Rest for a few seconds and repeat Steps 1 through 7 at least 10 times every 1-2 hours when you are awake. Take your time and take a few normal breaths between deep breaths. 9. The spirometer may include an indicator  to  show your best effort. Use the indicator as a goal to work toward during each repetition. 10. After each set of 10 deep breaths, practice coughing to be sure your lungs are clear. If you have an incision (the cut made at the time of surgery), support your incision when coughing by placing a pillow or rolled up towels firmly against it. Once you are able to get out of bed, walk around indoors and cough well. You may stop using the incentive spirometer when instructed by your caregiver.  RISKS AND COMPLICATIONS  Take your time so you do not get dizzy or light-headed.  If you are in pain, you may need to take or ask for pain medication before doing incentive spirometry. It is harder to take a deep breath if you are having pain. AFTER USE  Rest and breathe slowly and easily.  It can be helpful to keep track of a log of your progress. Your caregiver can provide you with a simple table to help with this. If you are using the spirometer at home, follow these instructions: [place] IF:   You are having difficultly using the spirometer.  You have trouble using the spirometer as often as instructed.  Your pain medication is not giving enough relief while using the spirometer.  You develop fever of 100.5 F (38.1 C) or higher. SEEK IMMEDIATE MEDICAL CARE IF:   You cough up bloody sputum that had not been present before.  You develop fever of 102 F (38.9 C) or greater.  You develop worsening pain at or near the incision site. MAKE SURE YOU:   Understand these instructions.  Will watch your condition.  Will get help right away if you are not doing well or get worse. Document Released: 03/23/2007 Document Revised: 02/02/2012 Document Reviewed: 05/24/2007 [facility] Patient Information 2014 [place], [place].   ________________________________________________________________________

## 2021-04-04 ENCOUNTER — Encounter (HOSPITAL_COMMUNITY)
Admission: RE | Admit: 2021-04-04 | Discharge: 2021-04-04 | Disposition: A | Discharge status: Home / Self Care | Payer: BC Managed Care – PPO | Source: Ambulatory Visit | Attending: Specialist | Admitting: Specialist

## 2021-04-04 ENCOUNTER — Encounter (HOSPITAL_COMMUNITY): Payer: Self-pay

## 2021-04-04 ENCOUNTER — Other Ambulatory Visit: Payer: Self-pay

## 2021-04-04 DIAGNOSIS — Z01818 Encounter for other preprocedural examination: Secondary | ICD-10-CM | POA: Insufficient documentation

## 2021-04-04 HISTORY — DX: Type 2 diabetes mellitus without complications: E11.9

## 2021-04-04 LAB — BASIC METABOLIC PANEL
Anion gap: 5 (ref 5–15)
BUN: 16 mg/dL (ref 8–23)
CO2: 26 mmol/L (ref 22–32)
Calcium: 9.4 mg/dL (ref 8.9–10.3)
Chloride: 108 mmol/L (ref 98–111)
Creatinine, Ser: 0.65 mg/dL (ref 0.44–1.00)
GFR, Estimated: >60 mL/min (ref >60)
Glucose, Bld: 161 mg/dL — ABNORMAL HIGH (ref 70–99)
Potassium: 4.1 mmol/L (ref 3.5–5.1)
Sodium: 139 mmol/L (ref 135–145)

## 2021-04-04 LAB — CBC
HCT: 43.8 % (ref 36.0–46.0)
Hemoglobin: 14.4 g/dL (ref 12.0–15.0)
MCH: 30.1 pg (ref 26.0–34.0)
MCHC: 32.9 g/dL (ref 30.0–36.0)
MCV: 91.4 fL (ref 80.0–100.0)
Platelets: 208 10*3/uL (ref 150–400)
RBC: 4.79 MIL/uL (ref 3.87–5.11)
RDW: 13.4 % (ref 11.5–15.5)
WBC: 5.7 10*3/uL (ref 4.0–10.5)
nRBC: 0 % (ref 0.0–0.2)

## 2021-04-04 LAB — URINALYSIS, ROUTINE W REFLEX MICROSCOPIC
Bilirubin Urine: NEGATIVE
Glucose, UA: NEGATIVE mg/dL
Hgb urine dipstick: NEGATIVE
Ketones, ur: NEGATIVE mg/dL
Leukocytes,Ua: NEGATIVE
Nitrite: NEGATIVE
Protein, ur: NEGATIVE mg/dL
Specific Gravity, Urine: 1.02 (ref 1.005–1.030)
pH: 6 (ref 5.0–8.0)

## 2021-04-04 LAB — GLUCOSE, CAPILLARY: Glucose-Capillary: 179 mg/dL — ABNORMAL HIGH (ref 70–99)

## 2021-04-04 LAB — PROTIME-INR
INR: 1 (ref 0.8–1.2)
Prothrombin Time: 13 seconds (ref 11.4–15.2)

## 2021-04-04 LAB — SURGICAL PCR SCREEN
MRSA, PCR: NEGATIVE
Staphylococcus aureus: NEGATIVE

## 2021-04-04 LAB — APTT: aPTT: 32 seconds (ref 24–36)

## 2021-04-04 NOTE — Progress Notes (Signed)
COVID Vaccine Completed: Yes Date COVID Vaccine completed: 03/04/20 COVID vaccine manufacturer: Pfizer      PCP - Dr. Cathlean Cower. LOV: 02/07/21 Cardiologist -   Chest x-ray -  EKG -  Stress Test -  ECHO -  Cardiac Cath -  Pacemaker/ICD device last checked: A1-C: 6.3 @ 02/06/21 Sleep Study - Yes CPAP - Yes  Fasting Blood Sugar - N/A Checks Blood Sugar _N/A____ times a day  Blood Thinner Instructions: Aspirin Instructions: Last Dose:  Anesthesia review: DIA,HTN,OSA(CPAP)  Patient denies shortness of breath, fever, cough and chest pain at PAT appointment   Patient verbalized understanding of instructions that were given to them at the PAT appointment. Patient was also instructed that they will need to review over the PAT instructions again at home before surgery.

## 2021-04-05 ENCOUNTER — Other Ambulatory Visit: Payer: Self-pay | Admitting: Internal Medicine

## 2021-04-05 NOTE — Telephone Encounter (Signed)
Please refill as per office routine med refill policy (all routine meds refilled for 3 mo or monthly per pt preference up to one year from last visit, then month to month grace period for 3 mo, then further med refills will have to be denied)  

## 2021-04-08 ENCOUNTER — Ambulatory Visit: Payer: Self-pay | Admitting: Orthopedic Surgery

## 2021-04-08 ENCOUNTER — Other Ambulatory Visit: Payer: Self-pay | Admitting: Internal Medicine

## 2021-04-08 NOTE — H&P (Signed)
Tanya [name] is an 65 y.o. female.   Chief Complaint: R knee pain HPI: The patient is here for her H&P. She is scheduled for a right total knee replacement by Dr. Tonita Cong on 04/12/21 at [facility].  She has failed conservative treatment including bracing, quad strengthening, activity modifications, medications, injection therapy. Pain is interfering with activities of daily living and quality-of-life and she desires to proceed with surgery.  Dr. Tonita Cong and the patient mutually agreed to proceed with a total knee replacement. Risks and benefits of the procedure were discussed including stiffness, suboptimal range of motion, persistent pain, infection requiring removal of prosthesis and reinsertion, need for prophylactic antibiotics in the future, for example, dental procedures, possible need for manipulation, revision in the future and also anesthetic complications including DVT, PE, etc. We discussed the perioperative course, time in the hospital, postoperative recovery and the need for elevation to control swelling. We also discussed the predicted range of motion and the probability that squatting and kneeling would be unobtainable in the future. In addition, postoperative anticoagulation was discussed. We have obtained preoperative medical clearance as necessary. Provided illustrated handout and discussed it in detail. They will enroll in the total joint replacement educational forum at the hospital.  Past Medical History:  Diagnosis Date  . Allergic rhinitis   . Blepharospasm 10/09/2010  . Cancer (HCC)    Skin  . Diabetes mellitus without complication ([place])   . Hip pain   . History of melanoma excision    MULTIPLE SITES  . Hypertension   . Mild intermittent asthma   . OA (osteoarthritis)   . PMB (postmenopausal bleeding)   . PONV (postoperative nausea and vomiting)    did not experience after D&C, request to use same medicaitons from that procedure  . Precancerous changes of the  cervix   . Sleep apnea    tested in home around 2 yrs ago  . Varicose vein    wears compression stocking    Past Surgical History:  Procedure Laterality Date  . ABDOMINAL HYSTERECTOMY    . COLONOSCOPY    . DILATION AND CURETTAGE OF UTERUS    . ESOPHAGOGASTRODUODENOSCOPY  10/14/2004  . HAMMER TOE SURGERY    . HYSTEROSCOPY WITH D & C N/A 04/07/2016   Procedure: DILATATION AND CURETTAGE /HYSTEROSCOPY WITH MYOSURE POLYPECTOMY;  Surgeon: Cheri Fowler, MD;  Location: [facility];  Service: Gynecology;  Laterality: N/A;  . JOINT REPLACEMENT    . KNEE ARTHROSCOPY Bilateral left 08-01-2005/  right  . LAPAROSCOPIC HYSTERECTOMY N/A 06/02/2016   Procedure: HYSTERECTOMY TOTAL LAPAROSCOPIC;  Surgeon: Cheri Fowler, MD;  Location: [facility];  Service: Gynecology;  Laterality: N/A;  . LAPAROSCOPIC SALPINGO OOPHERECTOMY Bilateral 06/02/2016   Procedure: LAPAROSCOPIC SALPINGO OOPHORECTOMY;  Surgeon: Cheri Fowler, MD;  Location: [facility];  Service: Gynecology;  Laterality: Bilateral;  . RE-DO ROTATOR CUFF REPAIR/  SAD/  ACROMIOPLASTY Right 01-04-2009  . SHOULDER ARTHROSCOPY W/ ROTATOR CUFF REPAIR Right   . tailor bunion removal    . TONSILLECTOMY    . TOTAL HIP ARTHROPLASTY Right 09/12/2016   Procedure: RIGHT TOTAL HIP ARTHROPLASTY ANTERIOR APPROACH;  Surgeon: Rod Can, MD;  Location: [facility];  Service: Orthopedics;  Laterality: Right;  . TOTAL HIP ARTHROPLASTY Right 10/01/2016   Procedure: IRRIGATION AND DEBRIDEMENT RIGHT HIP;  Surgeon: Rod Can, MD;  Location: [facility];  Service: Orthopedics;  Laterality: Right;  . TOTAL KNEE ARTHROPLASTY Left 12/29/2013   Procedure: LEFT TOTAL KNEE ARTHROPLASTY;  Surgeon: Johnn Hai,  MD;  Location: [facility];  Service: Orthopedics;  Laterality: Left;    Family History  Problem Relation Age of Onset  . Esophageal cancer Father   . Melanoma Sister   . Diabetes Other   . Ovarian cancer Other   . Asthma Other        siblings   Social History:   reports that she quit smoking about 33 years ago. Her smoking use included cigarettes. She has a 20.00 pack-year smoking history. She has never used smokeless tobacco. She reports current alcohol use of about 2.0 standard drinks of alcohol per week. She reports that she does not use drugs.  Allergies:  Allergies  Allergen Reactions  . Ace Inhibitors Hives  . Amoxicillin Hives  . Other Hives    Nucafed  . Penicillins Hives    Has patient had a PCN reaction causing immediate rash, facial/tongue/throat swelling, SOB or lightheadedness with hypotension: Yes Has patient had a PCN reaction causing severe rash involving mucus membranes or skin necrosis: No Has patient had a PCN reaction that required hospitalization No Has patient had a PCN reaction occurring within the last 10 years: No If all of the above answers are "NO", then may proceed with Cephalosporin use.   . Metformin And Related Other (See Comments)    GI upset  . Wound Dressing Adhesive Other (See Comments)    Tore her skin  . Pseudoephedrine Hives  . Pseudoephedrine-Codeine Hives   Current Medications: amLODIPine 10 mg tablet calcium acetate(phosphate binders) 667 mg capsule celecoxib 200 mg capsule clindamycin HCL 150 mg capsule glipiZIDE ER 2.5 mg tablet, extended release 24 hr irbesartan 300 mg tablet methocarbamoL 500 mg tablet montelukast 10 mg tablet tolterodine ER 4 mg capsule,extended release 24 hr traMADoL ER 200 mg tablet,extended release 24 hr  Review of Systems  Constitutional: Negative.   HENT: Negative.   Eyes: Negative.   Respiratory: Negative.   Cardiovascular: Negative.   Gastrointestinal: Negative.   Endocrine: Negative.   Genitourinary: Negative.   Musculoskeletal: Positive for arthralgias and joint swelling.  Neurological: Negative.     Last menstrual period 09/13/2013. Physical Exam Constitutional:      Appearance: Normal appearance.  HENT:     Head: Normocephalic.     Right Ear:  External ear normal.     Left Ear: External ear normal.     Nose: Nose normal.     Mouth/Throat:     Pharynx: Oropharynx is clear.  Eyes:     Conjunctiva/sclera: Conjunctivae normal.  Cardiovascular:     Rate and Rhythm: Normal rate and regular rhythm.     Pulses: Normal pulses.  Pulmonary:     Effort: Pulmonary effort is normal.     Breath sounds: Normal breath sounds.  Abdominal:     General: Bowel sounds are normal.  Musculoskeletal:     Cervical back: Normal range of motion.     Comments: The patient is here for her H&P. She is scheduled for a right total knee replacement by Dr. Tonita Cong on 04/12/21 at [facility].  She has failed conservative treatment including bracing, quad strengthening, activity modifications, medications, injection therapy. Pain is interfering with activities of daily living and quality-of-life and she desires to proceed with surgery.  Dr. Tonita Cong and the patient mutually agreed to proceed with a total knee replacement. Risks and benefits of the procedure were discussed including stiffness, suboptimal range of motion, persistent pain, infection requiring removal of prosthesis and reinsertion, need for prophylactic antibiotics in the  future, for example, dental procedures, possible need for manipulation, revision in the future and also anesthetic complications including DVT, PE, etc. We discussed the perioperative course, time in the hospital, postoperative recovery and the need for elevation to control swelling. We also discussed the predicted range of motion and the probability that squatting and kneeling would be unobtainable in the future. In addition, postoperative anticoagulation was discussed. We have obtained preoperative medical clearance as necessary. Provided illustrated handout and discussed it in detail. They will enroll in the total joint replacement educational forum at the hospital.  Skin:    General: Skin is warm and dry.  Neurological:      Mental Status: She is alert.      Assessment/Plan Impression: End-stage right knee osteoarthritis  Plan:Pt with end-stage right knee DJD, bone-on-bone, refractory to conservative tx, scheduled for right total knee replacement by Dr. Tonita Cong on 5/20. We again discussed the procedure itself as well as risks, complications and alternatives, including but not limited to DVT, PE, infx, bleeding, failure of procedure, need for secondary procedure including manipulation, nerve injury, ongoing pain/symptoms, anesthesia risk, even stroke or death. Also discussed typical post-op protocols, activity restrictions, need for PT, flexion/extension exercises, time out of work. Discussed need for DVT ppx post-op per protocol. Discussed dental ppx and infx prevention. Also discussed limitations post-operatively such as kneeling and squatting. All questions were answered. Patient desires to proceed with surgery as scheduled.  Will hold supplements, ASA and NSAIDs accordingly. Will remain NPO after midnight the night before surgery. [facility] pre-op [facility]. Anticipate hospital stay to include at least 2 midnights given medical history and to ensure proper pain control. Plan aspirin for DVT ppx post-op. Plan oxycodone, Robaxin, Colace, Miralax. Plan home with HHPT post-op with family members at home for assistance then transition to outpatient PT when ready. Will follow up 10-14 days post-op for suture removal and xrays.  Plan Right total knee replacement  Cecilie Kicks, PA-C  For Dr. Tonita Cong 04/08/2021, 4:06 PM

## 2021-04-09 ENCOUNTER — Other Ambulatory Visit (HOSPITAL_COMMUNITY)
Admission: RE | Admit: 2021-04-09 | Discharge: 2021-04-09 | Disposition: A | Discharge status: Home / Self Care | Payer: BC Managed Care – PPO | Source: Ambulatory Visit | Attending: Specialist | Admitting: Specialist

## 2021-04-09 DIAGNOSIS — U071 COVID-19: Secondary | ICD-10-CM | POA: Insufficient documentation

## 2021-04-09 DIAGNOSIS — Z01812 Encounter for preprocedural laboratory examination: Secondary | ICD-10-CM | POA: Insufficient documentation

## 2021-04-09 LAB — SARS CORONAVIRUS 2 (TAT 6-24 HRS): SARS Coronavirus 2: POSITIVE — AB

## 2021-04-10 ENCOUNTER — Other Ambulatory Visit: Payer: Self-pay | Admitting: Adult Health

## 2021-04-10 ENCOUNTER — Ambulatory Visit: Payer: Self-pay | Admitting: Orthopedic Surgery

## 2021-04-10 ENCOUNTER — Telehealth (HOSPITAL_COMMUNITY): Payer: Self-pay

## 2021-04-10 DIAGNOSIS — U071 COVID-19: Secondary | ICD-10-CM

## 2021-04-10 MED ORDER — MOLNUPIRAVIR EUA 200MG CAPSULE: 4.0000 | ORAL_CAPSULE | Freq: Two times a day (BID) | ORAL | 0 refills | Status: AC

## 2021-04-10 NOTE — Telephone Encounter (Signed)
04/10/21 - Contacted surgeon's office - spoke to [facility] - advised of patient's Positive Covid19 lab results. MBM

## 2021-04-10 NOTE — Progress Notes (Signed)
Outpatient Oral COVID Treatment Note  I connected with Alyson Reedy on 04/10/2021/11:55 AM by telephone and verified that I am speaking with the correct person using two identifiers.  I discussed the limitations, risks, security, and privacy concerns of performing an evaluation and management service by telephone and the availability of in person appointments. I also discussed with the patient that there may be a patient responsible charge related to this service. The patient expressed understanding and agreed to proceed.  Patient location: home Provider location: home Others participating in the call: none  Diagnosis: COVID-19 infection  Purpose of visit: Discussion of potential use of Molnupiravir or Paxlovid, a new treatment for mild to moderate COVID-19 viral infection in non-hospitalized patients.   Subjective: Patient is a 65 y.o. female who has been diagnosed with COVID 19 viral infection.  Their symptoms began on 04/08/2021 with nasal congestion.    Past Medical History:  Diagnosis Date  . Allergic rhinitis   . Blepharospasm 10/09/2010  . Cancer (HCC)    Skin  . Diabetes mellitus without complication ([place])   . Hip pain   . History of melanoma excision    MULTIPLE SITES  . Hypertension   . Mild intermittent asthma   . OA (osteoarthritis)   . PMB (postmenopausal bleeding)   . PONV (postoperative nausea and vomiting)    did not experience after D&C, request to use same medicaitons from that procedure  . Precancerous changes of the cervix   . Sleep apnea    tested in home around 2 yrs ago  . Varicose vein    wears compression stocking    Allergies  Allergen Reactions  . Ace Inhibitors Hives  . Amoxicillin Hives  . Other Hives    Nucafed  . Penicillins Hives    Has patient had a PCN reaction causing immediate rash, facial/tongue/throat swelling, SOB or lightheadedness with hypotension: Yes Has patient had a PCN reaction causing severe rash involving mucus membranes or  skin necrosis: No Has patient had a PCN reaction that required hospitalization No Has patient had a PCN reaction occurring within the last 10 years: No If all of the above answers are "NO", then may proceed with Cephalosporin use.   . Metformin And Related Other (See Comments)    GI upset  . Wound Dressing Adhesive Other (See Comments)    Tore her skin  . Pseudoephedrine Hives  . Pseudoephedrine-Codeine Hives     Current Outpatient Medications:  .  celecoxib (CELEBREX) 200 MG capsule, TAKE ONE CAP BY MOUTH 2 TIMES DAILY AS NEEDED., Disp: 60 capsule, Rfl: 2 .  albuterol (VENTOLIN HFA) 108 (90 Base) MCG/ACT inhaler, Inhale 2 puffs into the lungs every 6 (six) hours as needed for wheezing or shortness of breath., Disp: 54 g, Rfl: 3 .  amLODipine (NORVASC) 10 MG tablet, TAKE 1 TABLET BY MOUTH EVERY DAY IN THE MORNING, Disp: 90 tablet, Rfl: 3 .  aspirin 81 MG tablet, Take 1 tablet (81 mg total) by mouth 2 (two) times daily after a meal. (Patient taking differently: Take 81 mg by mouth daily.), Disp: 60 tablet, Rfl: 1 .  calcium acetate (PHOSLO) 667 MG capsule, Take 667 mg by mouth at bedtime., Disp: , Rfl:  .  calcium gluconate 500 MG tablet, Take 1 tablet (500 mg total) by mouth daily. (Patient not taking: Reported on 04/01/2021), Disp: 30 tablet, Rfl: 0 .  cetirizine (ZYRTEC) 10 MG tablet, TAKE 1 TABLET (10 MG TOTAL) BY MOUTH DAILY. (Patient taking differently:  Take 10 mg by mouth in the morning.), Disp: 90 tablet, Rfl: 3 .  glipiZIDE (GLUCOTROL XL) 2.5 MG 24 hr tablet, Take 1 tablet (2.5 mg total) by mouth daily with breakfast., Disp: 90 tablet, Rfl: 3 .  Glucosamine-Chondroit-Vit C-Mn (GLUCOSAMINE 1500 COMPLEX PO), Take 1 tablet by mouth 2 (two) times daily., Disp: , Rfl:  .  irbesartan (AVAPRO) 300 MG tablet, Take 1 tablet (300 mg total) by mouth daily., Disp: 90 tablet, Rfl: 3 .  methocarbamol (ROBAXIN) 500 MG tablet, TAKE 1 TABLET (500 MG TOTAL) BY MOUTH 4 (FOUR) TIMES DAILY. (Patient taking  differently: Take 500 mg by mouth every 6 (six) hours as needed for muscle spasms.), Disp: 120 tablet, Rfl: 3 .  montelukast (SINGULAIR) 10 MG tablet, Take 1 tablet (10 mg total) by mouth daily. (Patient taking differently: Take 10 mg by mouth in the morning.), Disp: 90 tablet, Rfl: 3 .  Multiple Vitamin (MULTIVITAMIN WITH MINERALS) TABS tablet, Take 1 tablet by mouth daily., Disp: , Rfl:  .  NON FORMULARY, Pt uses cpap nightly, Disp: , Rfl:  .  Oyster Shell Calcium 500 MG TABS, TAKE 1 TABLET BY MOUTH EVERY DAY (Patient not taking: Reported on 04/01/2021), Disp: 30 tablet, Rfl: 11 .  polycarbophil (FIBERCON) 625 MG tablet, Take 625 mg by mouth daily., Disp: , Rfl:  .  tolterodine (DETROL LA) 4 MG 24 hr capsule, Take 1 capsule (4 mg total) by mouth daily. (Patient taking differently: Take 4 mg by mouth in the morning.), Disp: 90 capsule, Rfl: 3 .  traMADol (ULTRAM-ER) 200 MG 24 hr tablet, Take 1 tablet (200 mg total) by mouth daily., Disp: 30 tablet, Rfl: 5  Objective: Patient appears/sounds tired and congested.  They are in no apparent distress.  Breathing is non labored.  Mood and behavior are normal.  Laboratory Data:  Recent Results (from the past 2160 hour(s))  VITAMIN D 25 Hydroxy (Vit-D Deficiency, Fractures)     Status: None   Collection Time: 02/06/21  8:59 AM  Result Value Ref Range   VITD 54.12 30.00 - 100.00 ng/mL  Vitamin B12     Status: None   Collection Time: 02/06/21  8:59 AM  Result Value Ref Range   Vitamin B-12 404 211 - 911 pg/mL  Basic metabolic panel     Status: Abnormal   Collection Time: 02/06/21  8:59 AM  Result Value Ref Range   Sodium 140 135 - 145 mEq/L   Potassium 4.0 3.5 - 5.1 mEq/L   Chloride 105 96 - 112 mEq/L   CO2 25 19 - 32 mEq/L   Glucose, Bld 111 (H) 70 - 99 mg/dL   BUN 18 6 - 23 mg/dL   Creatinine, Ser 0.72 0.40 - 1.20 mg/dL   GFR 89.25 >60.00 mL/min    Comment: Calculated using the CKD-EPI Creatinine Equation (2021)   Calcium 9.4 8.4 - 10.5 mg/dL   Urinalysis, Routine w reflex microscopic     Status: None   Collection Time: 02/06/21  8:59 AM  Result Value Ref Range   Color, Urine YELLOW Yellow;Lt. Yellow;Straw;Dark Yellow;Amber;Green;Red;Brown   APPearance CLEAR Clear;Turbid;Slightly Cloudy;Cloudy   Specific Gravity, Urine 1.015 1.000 - 1.030   pH 6.5 5.0 - 8.0   Total Protein, Urine NEGATIVE Negative   Urine Glucose NEGATIVE Negative   Ketones, ur NEGATIVE Negative   Bilirubin Urine NEGATIVE Negative   Hgb urine dipstick NEGATIVE Negative   Urobilinogen, UA 0.2 0.0 - 1.0   Leukocytes,Ua NEGATIVE Negative   Nitrite  NEGATIVE Negative   WBC, UA 0-2/hpf 0-2/hpf   RBC / HPF none seen 0-2/hpf   Squamous Epithelial / LPF Rare(0-4/hpf) Rare(0-4/hpf)  TSH     Status: None   Collection Time: 02/06/21  8:59 AM  Result Value Ref Range   TSH 1.06 0.35 - 4.50 uIU/mL  CBC with Differential/Platelet     Status: None   Collection Time: 02/06/21  8:59 AM  Result Value Ref Range   WBC 5.7 4.0 - 10.5 K/uL   RBC 4.85 3.87 - 5.11 Mil/uL   Hemoglobin 14.7 12.0 - 15.0 g/dL   HCT 42.9 36.0 - 46.0 %   MCV 88.3 78.0 - 100.0 fl   MCHC 34.3 30.0 - 36.0 g/dL   RDW 13.9 11.5 - 15.5 %   Platelets 198.0 150.0 - 400.0 K/uL   Neutrophils Relative % 58.6 43.0 - 77.0 %   Lymphocytes Relative 33.3 12.0 - 46.0 %   Monocytes Relative 5.9 3.0 - 12.0 %   Eosinophils Relative 1.7 0.0 - 5.0 %   Basophils Relative 0.5 0.0 - 3.0 %   Neutro Abs 3.3 1.4 - 7.7 K/uL   Lymphs Abs 1.9 0.7 - 4.0 K/uL   Monocytes Absolute 0.3 0.1 - 1.0 K/uL   Eosinophils Absolute 0.1 0.0 - 0.7 K/uL   Basophils Absolute 0.0 0.0 - 0.1 K/uL  Hepatic function panel     Status: None   Collection Time: 02/06/21  8:59 AM  Result Value Ref Range   Total Bilirubin 0.7 0.2 - 1.2 mg/dL   Bilirubin, Direct 0.1 0.0 - 0.3 mg/dL   Alkaline Phosphatase 66 39 - 117 U/L   AST 20 0 - 37 U/L   ALT 28 0 - 35 U/L   Total Protein 6.9 6.0 - 8.3 g/dL   Albumin 4.0 3.5 - 5.2 g/dL  Lipid panel      Status: None   Collection Time: 02/06/21  8:59 AM  Result Value Ref Range   Cholesterol 133 0 - 200 mg/dL    Comment: ATP III Classification       Desirable:  < 200 mg/dL               Borderline High:  200 - 239 mg/dL          High:  > = 240 mg/dL   Triglycerides 73.0 0.0 - 149.0 mg/dL    Comment: Normal:  <150 mg/dLBorderline High:  150 - 199 mg/dL   HDL 56.00 >39.00 mg/dL   VLDL 14.6 0.0 - 40.0 mg/dL   LDL Cholesterol 62 0 - 99 mg/dL   Total CHOL/HDL Ratio 2     Comment:                Men          Women1/2 Average Risk     3.4          3.3Average Risk          5.0          4.42X Average Risk          9.6          7.13X Average Risk          15.0          11.0                       NonHDL 76.94     Comment: NOTE:  Non-HDL goal should be 30 mg/dL  higher than patient's LDL goal (i.e. LDL goal of < 70 mg/dL, would have non-HDL goal of < 100 mg/dL)  Hemoglobin A1c     Status: None   Collection Time: 02/06/21  8:59 AM  Result Value Ref Range   Hgb A1c MFr Bld 6.3 4.6 - 6.5 %    Comment: Glycemic Control Guidelines for People with Diabetes:Non Diabetic:  <6%Goal of Therapy: <7%Additional Action Suggested:  >8%   Microalbumin / creatinine urine ratio     Status: None   Collection Time: 02/06/21  8:59 AM  Result Value Ref Range   Microalb, Ur <0.7 0.0 - 1.9 mg/dL   Creatinine,U 121.0 mg/dL   Microalb Creat Ratio 0.6 0.0 - 30.0 mg/g  Glucose, capillary     Status: Abnormal   Collection Time: 04/04/21  2:29 PM  Result Value Ref Range   Glucose-Capillary 179 (H) 70 - 99 mg/dL    Comment: Glucose reference range applies only to samples taken after fasting for at least 8 hours.  Surgical pcr screen     Status: None   Collection Time: 04/04/21  2:41 PM   Specimen: Nasal Mucosa; Nasal Swab  Result Value Ref Range   MRSA, PCR NEGATIVE NEGATIVE   Staphylococcus aureus NEGATIVE NEGATIVE    Comment: (NOTE) The Xpert SA Assay (FDA approved for NASAL specimens in patients 8 years of age and  older), is one component of a comprehensive surveillance program. It is not intended to diagnose infection nor to guide or monitor treatment. Performed at [facility] [place] [address] [place], [place] [address]   CBC     Status: None   Collection Time: 04/04/21  2:41 PM  Result Value Ref Range   WBC 5.7 4.0 - 10.5 K/uL   RBC 4.79 3.87 - 5.11 MIL/uL   Hemoglobin 14.4 12.0 - 15.0 g/dL   HCT 43.8 36.0 - 46.0 %   MCV 91.4 80.0 - 100.0 fL   MCH 30.1 26.0 - 34.0 pg   MCHC 32.9 30.0 - 36.0 g/dL   RDW 13.4 11.5 - 15.5 %   Platelets 208 150 - 400 K/uL   nRBC 0.0 0.0 - 0.2 %    Comment: Performed at [facility], [place] [address], [place], [place] [address]  Basic metabolic panel     Status: Abnormal   Collection Time: 04/04/21  2:41 PM  Result Value Ref Range   Sodium 139 135 - 145 mmol/L   Potassium 4.1 3.5 - 5.1 mmol/L   Chloride 108 98 - 111 mmol/L   CO2 26 22 - 32 mmol/L   Glucose, Bld 161 (H) 70 - 99 mg/dL    Comment: Glucose reference range applies only to samples taken after fasting for at least 8 hours.   BUN 16 8 - 23 mg/dL   Creatinine, Ser 0.65 0.44 - 1.00 mg/dL   Calcium 9.4 8.9 - 10.3 mg/dL   GFR, Estimated >60 >60 mL/min    Comment: (NOTE) Calculated using the CKD-EPI Creatinine Equation (2021)    Anion gap 5 5 - 15    Comment: Performed at [facility], [place] [address], [place], [place] [address]  Protime-INR     Status: None   Collection Time: 04/04/21  2:41 PM  Result Value Ref Range   Prothrombin Time 13.0 11.4 - 15.2 seconds   INR 1.0 0.8 - 1.2    Comment: (NOTE) INR goal varies based on device and disease states. Performed at [facility]  [place] [address], [place], [place] [address]   APTT     Status: None   Collection Time: 04/04/21  2:41 PM  Result Value Ref Range   aPTT 32 24 - 36 seconds    Comment: Performed at [facility], [place] [address], [place],  [place] [address]  Urinalysis, Routine w reflex microscopic     Status: None   Collection Time: 04/04/21  2:41 PM  Result Value Ref Range   Color, Urine YELLOW YELLOW   APPearance CLEAR CLEAR   Specific Gravity, Urine 1.020 1.005 - 1.030   pH 6.0 5.0 - 8.0   Glucose, UA NEGATIVE NEGATIVE mg/dL   Hgb urine dipstick NEGATIVE NEGATIVE   Bilirubin Urine NEGATIVE NEGATIVE   Ketones, ur NEGATIVE NEGATIVE mg/dL   Protein, ur NEGATIVE NEGATIVE mg/dL   Nitrite NEGATIVE NEGATIVE   Leukocytes,Ua NEGATIVE NEGATIVE    Comment: Performed at [place] [address], [place], [place] [address]  SARS CORONAVIRUS 2 (TAT 6-24 HRS) Nasopharyngeal Nasopharyngeal Swab     Status: Abnormal   Collection Time: 04/09/21  2:09 PM   Specimen: Nasopharyngeal Swab  Result Value Ref Range   SARS Coronavirus 2 POSITIVE (A) NEGATIVE    Comment: (NOTE) SARS-CoV-2 target nucleic acids are DETECTED.  The SARS-CoV-2 RNA is generally detectable in upper and lower respiratory specimens during the acute phase of infection. Positive results are indicative of the presence of SARS-CoV-2 RNA. Clinical correlation with patient history and other diagnostic information is  necessary to determine patient infection status. Positive results do not rule out bacterial infection or co-infection with other viruses.  The expected result is Negative.  Fact Sheet for Patients: SugarRoll.be  Fact Sheet for Healthcare Providers: https://www.woods-mathews.com/  This test is not yet approved or cleared by the [place] FDA and  has been authorized for detection and/or diagnosis of SARS-CoV-2 by FDA under an Emergency Use Authorization (EUA). This EUA will remain  in effect (meaning this test can be used) for the duration of the COVID-19 declaration under Section 564(b)(1) of the Act, 21 U. S.C. section 360bbb-3(b)(1), unless the authorization is terminated or revoked  sooner.   Performed at [facility], [place] [address], [place], [place] [address]      Assessment: 65 y.o. female with mild/moderate COVID 19 viral infection diagnosed on 04/09/2021 at high risk for progression to severe COVID 19.  Plan:  This patient is a 65 y.o. female that meets the following criteria for Emergency Use Authorization of: Molnupiravir  1. Age >18 yr 2. SARS-COV-2 positive test 3. Symptom onset < 5 days 4. Mild-to-moderate COVID disease with high risk for severe progression to hospitalization or death   I have spoken and communicated the following to the patient or parent/caregiver regarding: 1. Molnupiravir is an unapproved drug that is authorized for use under an [employer] production planner.  2. There are no adequate, approved, available products for the treatment of COVID-19 in adults who have mild-to-moderate COVID-19 and are at high risk for progressing to severe COVID-19, including hospitalization or death. 3. Other therapeutics are currently authorized. For additional information on all products authorized for treatment or prevention of COVID-19, please see TanEmporium.pl.  4. There are benefits and risks of taking this treatment as outlined in the "Fact Sheet for Patients and Caregivers."  5. "Fact Sheet for Patients and Caregivers" was reviewed with patient. A hard copy will be provided to patient from pharmacy prior to the  patient receiving treatment. 6. Patients should continue to self-isolate and use infection control measures (e.g., wear mask, isolate, social distance, avoid sharing personal items, clean and disinfect "high touch" surfaces, and frequent handwashing) according to CDC guidelines.  7. The patient or parent/caregiver has the option to accept or refuse treatment. 8. [place] has established a pregnancy surveillance  program. 9. Females of childbearing potential should use a reliable method of contraception correctly and consistently, as applicable, for the duration of treatment and for 4 days after the last dose of Molnupiravir. 26. Males of reproductive potential who are sexually active with females of childbearing potential should use a reliable method of contraception correctly and consistently during treatment and for at least 3 months after the last dose. 11. Pregnancy status and risk was assessed. Patient verbalized understanding of precautions.   After reviewing above information with the patient, the patient agrees to receive molnupiravir.  Follow up instructions:    . Take prescription BID x 5 days as directed . Reach out to pharmacist for counseling on medication if desired . For concerns regarding further COVID symptoms please follow up with your PCP or urgent care . For urgent or life-threatening issues, seek care at your local emergency department  The patient was provided an opportunity to ask questions, and all were answered. The patient agreed with the plan and demonstrated an understanding of the instructions.   [facility] and opted to [facility].  The patient was advised to call their PCP or seek an in-person evaluation if the symptoms worsen or if the condition fails to improve as anticipated.   I provided 10 minutes of non face-to-face telephone visit time during this encounter, and > 50% was spent counseling as documented under my assessment & plan.  Scot Dock, NP 04/10/2021 /11:55 AM

## 2021-04-21 ENCOUNTER — Other Ambulatory Visit: Payer: Self-pay | Admitting: Internal Medicine

## 2021-04-21 NOTE — Telephone Encounter (Signed)
Please refill as per office routine med refill policy (all routine meds refilled for 3 mo or monthly per pt preference up to one year from last visit, then month to month grace period for 3 mo, then further med refills will have to be denied)  

## 2021-04-24 DIAGNOSIS — H35379 Puckering of macula, unspecified eye: Secondary | ICD-10-CM

## 2021-04-24 HISTORY — DX: Puckering of macula, unspecified eye: H35.379

## 2021-04-26 ENCOUNTER — Other Ambulatory Visit: Payer: Self-pay | Admitting: Internal Medicine

## 2021-04-26 NOTE — Telephone Encounter (Signed)
Please refill as per office routine med refill policy (all routine meds refilled for 3 mo or monthly per pt preference up to one year from last visit, then month to month grace period for 3 mo, then further med refills will have to be denied)  

## 2021-04-29 ENCOUNTER — Ambulatory Visit: Payer: Self-pay | Admitting: Orthopedic Surgery

## 2021-04-29 NOTE — H&P (View-Only) (Signed)
Tanya [name] is an 65 y.o. female.   Chief Complaint: right knee pain HPI: The patient is here for her H&P. She is scheduled for a right total knee replacement by Dr. Tonita Cong on 04/12/21 at [facility]  She has failed conservative treatment including bracing, quad strengthening, activity modifications, medications, injection therapy. Pain is interfering with activities of daily living and quality-of-life and she desires to proceed with surgery.  Dr. Tonita Cong and the patient mutually agreed to proceed with a total knee replacement. Risks and benefits of the procedure were discussed including stiffness, suboptimal range of motion, persistent pain, infection requiring removal of prosthesis and reinsertion, need for prophylactic antibiotics in the future, for example, dental procedures, possible need for manipulation, revision in the future and also anesthetic complications including DVT, PE, etc. We discussed the perioperative course, time in the hospital, postoperative recovery and the need for elevation to control swelling. We also discussed the predicted range of motion and the probability that squatting and kneeling would be unobtainable in the future. In addition, postoperative anticoagulation was discussed. We have obtained preoperative medical clearance as necessary. Provided illustrated handout and discussed it in detail. They will enroll in the total joint replacement educational forum at the hospital.  Past Medical History:  Diagnosis Date  . Allergic rhinitis   . Blepharospasm 10/09/2010  . Cancer (HCC)    Skin  . Diabetes mellitus without complication ([place])   . Hip pain   . History of melanoma excision    MULTIPLE SITES  . Hypertension   . Mild intermittent asthma   . OA (osteoarthritis)   . PMB (postmenopausal bleeding)   . PONV (postoperative nausea and vomiting)    did not experience after D&C, request to use same medicaitons from that procedure  . Precancerous changes of  the cervix   . Sleep apnea    tested in home around 2 yrs ago  . Varicose vein    wears compression stocking    Past Surgical History:  Procedure Laterality Date  . ABDOMINAL HYSTERECTOMY    . COLONOSCOPY    . DILATION AND CURETTAGE OF UTERUS    . ESOPHAGOGASTRODUODENOSCOPY  10/14/2004  . HAMMER TOE SURGERY    . HYSTEROSCOPY WITH D & C N/A 04/07/2016   Procedure: DILATATION AND CURETTAGE /HYSTEROSCOPY WITH MYOSURE POLYPECTOMY;  Surgeon: Cheri Fowler, MD;  Location: [facility];  Service: Gynecology;  Laterality: N/A;  . JOINT REPLACEMENT    . KNEE ARTHROSCOPY Bilateral left 08-01-2005/  right  . LAPAROSCOPIC HYSTERECTOMY N/A 06/02/2016   Procedure: HYSTERECTOMY TOTAL LAPAROSCOPIC;  Surgeon: Cheri Fowler, MD;  Location: [facility];  Service: Gynecology;  Laterality: N/A;  . LAPAROSCOPIC SALPINGO OOPHERECTOMY Bilateral 06/02/2016   Procedure: LAPAROSCOPIC SALPINGO OOPHORECTOMY;  Surgeon: Cheri Fowler, MD;  Location: [facility];  Service: Gynecology;  Laterality: Bilateral;  . RE-DO ROTATOR CUFF REPAIR/  SAD/  ACROMIOPLASTY Right 01-04-2009  . SHOULDER ARTHROSCOPY W/ ROTATOR CUFF REPAIR Right   . tailor bunion removal    . TONSILLECTOMY    . TOTAL HIP ARTHROPLASTY Right 09/12/2016   Procedure: RIGHT TOTAL HIP ARTHROPLASTY ANTERIOR APPROACH;  Surgeon: Rod Can, MD;  Location: [place];  Service: Orthopedics;  Laterality: Right;  . TOTAL HIP ARTHROPLASTY Right 10/01/2016   Procedure: IRRIGATION AND DEBRIDEMENT RIGHT HIP;  Surgeon: Rod Can, MD;  Location: [place];  Service: Orthopedics;  Laterality: Right;  . TOTAL KNEE ARTHROPLASTY Left 12/29/2013   Procedure: LEFT TOTAL KNEE ARTHROPLASTY;  Surgeon: Johnn Hai,  MD;  Location: [facility];  Service: Orthopedics;  Laterality: Left;    Family History  Problem Relation Age of Onset  . Esophageal cancer Father   . Melanoma Sister   . Diabetes Other   . Ovarian cancer Other   . Asthma Other        siblings   Social  History:  reports that she quit smoking about 33 years ago. Her smoking use included cigarettes. She has a 20.00 pack-year smoking history. She has never used smokeless tobacco. She reports current alcohol use of about 2.0 standard drinks of alcohol per week. She reports that she does not use drugs.  Allergies:  Allergies  Allergen Reactions  . Ace Inhibitors Hives  . Amoxicillin Hives  . Other Hives    Nucafed  . Penicillins Hives    Has patient had a PCN reaction causing immediate rash, facial/tongue/throat swelling, SOB or lightheadedness with hypotension: Yes Has patient had a PCN reaction causing severe rash involving mucus membranes or skin necrosis: No Has patient had a PCN reaction that required hospitalization No Has patient had a PCN reaction occurring within the last 10 years: No If all of the above answers are "NO", then may proceed with Cephalosporin use.   . Metformin And Related Other (See Comments)    GI upset  . Wound Dressing Adhesive Other (See Comments)    Tore her skin  . Pseudoephedrine Hives  . Pseudoephedrine-Codeine Hives   Current Medications: amLODIPine 10 mg tablet calcium acetate(phosphate binders) 667 mg capsule celecoxib 200 mg capsule clindamycin HCL 150 mg capsule glipiZIDE ER 2.5 mg tablet, extended release 24 hr irbesartan 300 mg tablet methocarbamoL 500 mg tablet montelukast 10 mg tablet tolterodine ER 4 mg capsule,extended release 24 hr traMADoL ER 200 mg tablet,extended release 24 hr  Review of Systems  Constitutional: Negative.   HENT: Negative.   Eyes: Negative.   Respiratory: Negative.   Cardiovascular: Negative.   Gastrointestinal: Negative.   Endocrine: Negative.   Genitourinary: Negative.   Musculoskeletal: Positive for arthralgias, joint swelling and myalgias.  Skin: Negative.   Neurological: Negative.   Psychiatric/Behavioral: Negative.     Last menstrual period 09/13/2013. Physical Exam Constitutional:       Appearance: Normal appearance.  HENT:     Head: Normocephalic.     Right Ear: External ear normal.     Left Ear: External ear normal.     Nose: Nose normal.     Mouth/Throat:     Pharynx: Oropharynx is clear.  Eyes:     Conjunctiva/sclera: Conjunctivae normal.  Cardiovascular:     Rate and Rhythm: Normal rate and regular rhythm.     Pulses: Normal pulses.     Heart sounds: Normal heart sounds.  Pulmonary:     Effort: Pulmonary effort is normal.     Breath sounds: Normal breath sounds.  Abdominal:     General: Bowel sounds are normal.  Musculoskeletal:     Cervical back: Normal range of motion.     Comments: Right knee varus deformity. Tender medial joint line. Ranges 0-100. No DVT. Ipsilateral hip and ankle exams unremarkable. Patellofemoral pain compression.  Right knee with end-stage medial and patellofemoral joint space narrowing with a varus deformity  Skin:    General: Skin is warm and dry.  Neurological:     Mental Status: She is alert.      Assessment/Plan Impression: End-stage right knee osteoarthritis  Plan:Pt with end-stage right knee DJD, bone-on-bone, refractory to conservative tx, scheduled for  right total knee replacement by Dr. Tonita Cong on 5/20. We again discussed the procedure itself as well as risks, complications and alternatives, including but not limited to DVT, PE, infx, bleeding, failure of procedure, need for secondary procedure including manipulation, nerve injury, ongoing pain/symptoms, anesthesia risk, even stroke or death. Also discussed typical post-op protocols, activity restrictions, need for PT, flexion/extension exercises, time out of work. Discussed need for DVT ppx post-op per protocol. Discussed dental ppx and infx prevention. Also discussed limitations post-operatively such as kneeling and squatting. All questions were answered. Patient desires to proceed with surgery as scheduled.  Will hold supplements, ASA and NSAIDs accordingly. Will remain NPO  after midnight the night before surgery. Will present to [facility] for pre-op testing. Anticipate hospital stay to include at least 2 midnights given medical history and to ensure proper pain control. Plan aspirin for DVT ppx post-op. Plan oxycodone, Robaxin, Colace, Miralax. Plan home with HHPT post-op with family members at home for assistance then transition to outpatient PT when ready. Will follow up 10-14 days post-op for suture removal and xrays.  Pt's initial surgery date for may was rescheduled due to positive covid test pre-op  Plan Right total knee replacement  Cecilie Kicks, PA-C  For Dr. Tonita Cong 04/29/2021, 11:49 AM

## 2021-04-29 NOTE — H&P (Signed)
Tanya [name] is an 65 y.o. female.   Chief Complaint: right knee pain HPI: The patient is here for her H&P. She is scheduled for a right total knee replacement by Dr. Tonita Cong on 04/12/21 at [facility].  She has failed conservative treatment including bracing, quad strengthening, activity modifications, medications, injection therapy. Pain is interfering with activities of daily living and quality-of-life and she desires to proceed with surgery.  Dr. Tonita Cong and the patient mutually agreed to proceed with a total knee replacement. Risks and benefits of the procedure were discussed including stiffness, suboptimal range of motion, persistent pain, infection requiring removal of prosthesis and reinsertion, need for prophylactic antibiotics in the future, for example, dental procedures, possible need for manipulation, revision in the future and also anesthetic complications including DVT, PE, etc. We discussed the perioperative course, time in the hospital, postoperative recovery and the need for elevation to control swelling. We also discussed the predicted range of motion and the probability that squatting and kneeling would be unobtainable in the future. In addition, postoperative anticoagulation was discussed. We have obtained preoperative medical clearance as necessary. Provided illustrated handout and discussed it in detail. They will enroll in the total joint replacement educational forum at the hospital.  Past Medical History:  Diagnosis Date  . Allergic rhinitis   . Blepharospasm 10/09/2010  . Cancer (HCC)    Skin  . Diabetes mellitus without complication ([place])   . Hip pain   . History of melanoma excision    MULTIPLE SITES  . Hypertension   . Mild intermittent asthma   . OA (osteoarthritis)   . PMB (postmenopausal bleeding)   . PONV (postoperative nausea and vomiting)    did not experience after D&C, request to use same medicaitons from that procedure  . Precancerous changes of  the cervix   . Sleep apnea    tested in home around 2 yrs ago  . Varicose vein    wears compression stocking    Past Surgical History:  Procedure Laterality Date  . ABDOMINAL HYSTERECTOMY    . COLONOSCOPY    . DILATION AND CURETTAGE OF UTERUS    . ESOPHAGOGASTRODUODENOSCOPY  10/14/2004  . HAMMER TOE SURGERY    . HYSTEROSCOPY WITH D & C N/A 04/07/2016   Procedure: DILATATION AND CURETTAGE /HYSTEROSCOPY WITH MYOSURE POLYPECTOMY;  Surgeon: Cheri Fowler, MD;  Location: [facility];  Service: Gynecology;  Laterality: N/A;  . JOINT REPLACEMENT    . KNEE ARTHROSCOPY Bilateral left 08-01-2005/  right  . LAPAROSCOPIC HYSTERECTOMY N/A 06/02/2016   Procedure: HYSTERECTOMY TOTAL LAPAROSCOPIC;  Surgeon: Cheri Fowler, MD;  Location: [facility];  Service: Gynecology;  Laterality: N/A;  . LAPAROSCOPIC SALPINGO OOPHERECTOMY Bilateral 06/02/2016   Procedure: LAPAROSCOPIC SALPINGO OOPHORECTOMY;  Surgeon: Cheri Fowler, MD;  Location: [facility];  Service: Gynecology;  Laterality: Bilateral;  . RE-DO ROTATOR CUFF REPAIR/  SAD/  ACROMIOPLASTY Right 01-04-2009  . SHOULDER ARTHROSCOPY W/ ROTATOR CUFF REPAIR Right   . tailor bunion removal    . TONSILLECTOMY    . TOTAL HIP ARTHROPLASTY Right 09/12/2016   Procedure: RIGHT TOTAL HIP ARTHROPLASTY ANTERIOR APPROACH;  Surgeon: Rod Can, MD;  Location: [place];  Service: Orthopedics;  Laterality: Right;  . TOTAL HIP ARTHROPLASTY Right 10/01/2016   Procedure: IRRIGATION AND DEBRIDEMENT RIGHT HIP;  Surgeon: Rod Can, MD;  Location: [place];  Service: Orthopedics;  Laterality: Right;  . TOTAL KNEE ARTHROPLASTY Left 12/29/2013   Procedure: LEFT TOTAL KNEE ARTHROPLASTY;  Surgeon: Johnn Hai,  MD;  Location: [facility];  Service: Orthopedics;  Laterality: Left;    Family History  Problem Relation Age of Onset  . Esophageal cancer Father   . Melanoma Sister   . Diabetes Other   . Ovarian cancer Other   . Asthma Other        siblings   Social  History:  reports that she quit smoking about 33 years ago. Her smoking use included cigarettes. She has a 20.00 pack-year smoking history. She has never used smokeless tobacco. She reports current alcohol use of about 2.0 standard drinks of alcohol per week. She reports that she does not use drugs.  Allergies:  Allergies  Allergen Reactions  . Ace Inhibitors Hives  . Amoxicillin Hives  . Other Hives    Nucafed  . Penicillins Hives    Has patient had a PCN reaction causing immediate rash, facial/tongue/throat swelling, SOB or lightheadedness with hypotension: Yes Has patient had a PCN reaction causing severe rash involving mucus membranes or skin necrosis: No Has patient had a PCN reaction that required hospitalization No Has patient had a PCN reaction occurring within the last 10 years: No If all of the above answers are "NO", then may proceed with Cephalosporin use.   . Metformin And Related Other (See Comments)    GI upset  . Wound Dressing Adhesive Other (See Comments)    Tore her skin  . Pseudoephedrine Hives  . Pseudoephedrine-Codeine Hives   Current Medications: amLODIPine 10 mg tablet calcium acetate(phosphate binders) 667 mg capsule celecoxib 200 mg capsule clindamycin HCL 150 mg capsule glipiZIDE ER 2.5 mg tablet, extended release 24 hr irbesartan 300 mg tablet methocarbamoL 500 mg tablet montelukast 10 mg tablet tolterodine ER 4 mg capsule,extended release 24 hr traMADoL ER 200 mg tablet,extended release 24 hr  Review of Systems  Constitutional: Negative.   HENT: Negative.   Eyes: Negative.   Respiratory: Negative.   Cardiovascular: Negative.   Gastrointestinal: Negative.   Endocrine: Negative.   Genitourinary: Negative.   Musculoskeletal: Positive for arthralgias, joint swelling and myalgias.  Skin: Negative.   Neurological: Negative.   Psychiatric/Behavioral: Negative.     Last menstrual period 09/13/2013. Physical Exam Constitutional:       Appearance: Normal appearance.  HENT:     Head: Normocephalic.     Right Ear: External ear normal.     Left Ear: External ear normal.     Nose: Nose normal.     Mouth/Throat:     Pharynx: Oropharynx is clear.  Eyes:     Conjunctiva/sclera: Conjunctivae normal.  Cardiovascular:     Rate and Rhythm: Normal rate and regular rhythm.     Pulses: Normal pulses.     Heart sounds: Normal heart sounds.  Pulmonary:     Effort: Pulmonary effort is normal.     Breath sounds: Normal breath sounds.  Abdominal:     General: Bowel sounds are normal.  Musculoskeletal:     Cervical back: Normal range of motion.     Comments: Right knee varus deformity. Tender medial joint line. Ranges 0-100. No DVT. Ipsilateral hip and ankle exams unremarkable. Patellofemoral pain compression.  Right knee with end-stage medial and patellofemoral joint space narrowing with a varus deformity  Skin:    General: Skin is warm and dry.  Neurological:     Mental Status: She is alert.      Assessment/Plan Impression: End-stage right knee osteoarthritis  Plan:Pt with end-stage right knee DJD, bone-on-bone, refractory to conservative tx, scheduled for  right total knee replacement by Dr. Tonita Cong on 5/20. We again discussed the procedure itself as well as risks, complications and alternatives, including but not limited to DVT, PE, infx, bleeding, failure of procedure, need for secondary procedure including manipulation, nerve injury, ongoing pain/symptoms, anesthesia risk, even stroke or death. Also discussed typical post-op protocols, activity restrictions, need for PT, flexion/extension exercises, time out of work. Discussed need for DVT ppx post-op per protocol. Discussed dental ppx and infx prevention. Also discussed limitations post-operatively such as kneeling and squatting. All questions were answered. Patient desires to proceed with surgery as scheduled.  Will hold supplements, ASA and NSAIDs accordingly. Will remain NPO  after midnight the night before surgery. Will present to [facility] for pre-op testing. Anticipate hospital stay to include at least 2 midnights given medical history and to ensure proper pain control. Plan aspirin for DVT ppx post-op. Plan oxycodone, Robaxin, Colace, Miralax. Plan home with HHPT post-op with family members at home for assistance then transition to outpatient PT when ready. Will follow up 10-14 days post-op for suture removal and xrays.  Pt's initial surgery date for may was rescheduled due to positive covid test pre-op  Plan Right total knee replacement  Cecilie Kicks, PA-C  For Dr. Tonita Cong 04/29/2021, 11:49 AM

## 2021-05-01 ENCOUNTER — Telehealth: Payer: Self-pay

## 2021-05-01 DIAGNOSIS — H35372 Puckering of macula, left eye: Secondary | ICD-10-CM | POA: Diagnosis not present

## 2021-05-01 DIAGNOSIS — H35361 Drusen (degenerative) of macula, right eye: Secondary | ICD-10-CM | POA: Diagnosis not present

## 2021-05-01 DIAGNOSIS — H35342 Macular cyst, hole, or pseudohole, left eye: Secondary | ICD-10-CM | POA: Diagnosis not present

## 2021-05-01 DIAGNOSIS — H43813 Vitreous degeneration, bilateral: Secondary | ICD-10-CM | POA: Diagnosis not present

## 2021-05-01 NOTE — Telephone Encounter (Signed)
PA done for Tramadol.  Key: [id number]

## 2021-05-02 ENCOUNTER — Telehealth: Payer: Self-pay | Admitting: Internal Medicine

## 2021-05-02 NOTE — Telephone Encounter (Signed)
    Patient requesting refill for traMADol (ULTRAM-ER) 200 MG 24 hr tablet  [id number] [place] [place]

## 2021-05-08 ENCOUNTER — Other Ambulatory Visit (HOSPITAL_COMMUNITY): Payer: BC Managed Care – PPO

## 2021-05-13 ENCOUNTER — Encounter (HOSPITAL_COMMUNITY): Payer: Self-pay | Admitting: Specialist

## 2021-05-13 DIAGNOSIS — G5131 Clonic hemifacial spasm, right: Secondary | ICD-10-CM | POA: Diagnosis not present

## 2021-05-13 NOTE — Progress Notes (Signed)
Spoke with Tanya [name] medical history updated, made aware to arrive at 5:30AM 05/15/2021, drink Gatorade at 4:30AM 05/15/21, verbalize understanding.

## 2021-05-14 MED ORDER — VANCOMYCIN HCL 1500 MG/300ML IV SOLN
1500.0000 mg | INTRAVENOUS | Status: AC
  Administered 2021-05-15: 1500 mg via INTRAVENOUS
  Filled 2021-05-14: qty 300

## 2021-05-14 MED ORDER — GENTAMICIN SULFATE 40 MG/ML IJ SOLN
400.0000 mg | INTRAVENOUS | Status: AC
  Administered 2021-05-15: 400 mg via INTRAVENOUS
  Filled 2021-05-14: qty 10

## 2021-05-14 NOTE — Anesthesia Preprocedure Evaluation (Addendum)
Anesthesia Evaluation  Patient identified by MRN, date of birth, ID band Patient awake    Reviewed: Allergy & Precautions, NPO status , Patient's Chart, lab work & pertinent test results  History of Anesthesia Complications (+) PONV  Airway Mallampati: II  TM Distance: >3 FB Neck ROM: Full    Dental no notable dental hx. (+) Teeth Intact, Dental Advisory Given   Pulmonary sleep apnea and Continuous Positive Airway Pressure Ventilation , former smoker,    Pulmonary exam normal breath sounds clear to auscultation       Cardiovascular hypertension, Pt. on medications Normal cardiovascular exam Rhythm:Regular Rate:Normal     Neuro/Psych    GI/Hepatic negative GI ROS, Neg liver ROS,   Endo/Other  diabetes  Renal/GU negative Renal ROS     Musculoskeletal  (+) Arthritis , Osteoarthritis,    Abdominal (+) + obese,   Peds  Hematology   Anesthesia Other Findings All see list  Reproductive/Obstetrics                            Anesthesia Physical Anesthesia Plan  ASA: 3  Anesthesia Plan: Spinal   Post-op Pain Management:  [facility]ional for Post-op pain   Induction:   PONV Risk Score and Plan: 3 and Treatment may vary due to age or medical condition, Ondansetron, Midazolam and Dexamethasone  Airway Management Planned: Natural Airway  Additional Equipment: None  Intra-op Plan:   Post-operative Plan:   Informed Consent: I have reviewed the patients History and Physical, chart, labs and discussed the procedure including the risks, benefits and alternatives for the proposed anesthesia with the patient or authorized representative who has indicated his/her understanding and acceptance.     Dental advisory given  Plan Discussed with: CRNA and Anesthesiologist  Anesthesia Plan Comments: (R Adductor canal  W spinal)       Anesthesia Quick Evaluation

## 2021-05-15 ENCOUNTER — Encounter (HOSPITAL_COMMUNITY): Payer: Self-pay | Admitting: Specialist

## 2021-05-15 ENCOUNTER — Ambulatory Visit (HOSPITAL_COMMUNITY): Payer: BC Managed Care – PPO

## 2021-05-15 ENCOUNTER — Observation Stay (HOSPITAL_COMMUNITY)
Admission: RE | Admit: 2021-05-15 | Discharge: 2021-05-17 | Disposition: A | Discharge status: Home / Self Care | Payer: BC Managed Care – PPO | Attending: Specialist | Admitting: Specialist

## 2021-05-15 ENCOUNTER — Other Ambulatory Visit: Payer: Self-pay

## 2021-05-15 ENCOUNTER — Encounter (HOSPITAL_COMMUNITY)
Admission: RE | Disposition: A | Discharge status: Home / Self Care | Payer: Self-pay | Source: Home / Self Care | Attending: Specialist

## 2021-05-15 ENCOUNTER — Ambulatory Visit (HOSPITAL_COMMUNITY): Payer: BC Managed Care – PPO | Admitting: Anesthesiology

## 2021-05-15 DIAGNOSIS — Z87891 Personal history of nicotine dependence: Secondary | ICD-10-CM | POA: Insufficient documentation

## 2021-05-15 DIAGNOSIS — M21161 Varus deformity, not elsewhere classified, right knee: Secondary | ICD-10-CM | POA: Insufficient documentation

## 2021-05-15 DIAGNOSIS — Z96659 Presence of unspecified artificial knee joint: Secondary | ICD-10-CM

## 2021-05-15 DIAGNOSIS — Z85828 Personal history of other malignant neoplasm of skin: Secondary | ICD-10-CM | POA: Insufficient documentation

## 2021-05-15 DIAGNOSIS — I10 Essential (primary) hypertension: Secondary | ICD-10-CM | POA: Diagnosis not present

## 2021-05-15 DIAGNOSIS — M1611 Unilateral primary osteoarthritis, right hip: Secondary | ICD-10-CM | POA: Diagnosis not present

## 2021-05-15 DIAGNOSIS — Z7984 Long term (current) use of oral hypoglycemic drugs: Secondary | ICD-10-CM | POA: Insufficient documentation

## 2021-05-15 DIAGNOSIS — G8918 Other acute postprocedural pain: Secondary | ICD-10-CM | POA: Diagnosis not present

## 2021-05-15 DIAGNOSIS — M1711 Unilateral primary osteoarthritis, right knee: Principal | ICD-10-CM | POA: Diagnosis present

## 2021-05-15 DIAGNOSIS — Z96651 Presence of right artificial knee joint: Secondary | ICD-10-CM

## 2021-05-15 DIAGNOSIS — J4520 Mild intermittent asthma, uncomplicated: Secondary | ICD-10-CM | POA: Insufficient documentation

## 2021-05-15 DIAGNOSIS — Z79899 Other long term (current) drug therapy: Secondary | ICD-10-CM | POA: Insufficient documentation

## 2021-05-15 DIAGNOSIS — M25761 Osteophyte, right knee: Secondary | ICD-10-CM | POA: Diagnosis not present

## 2021-05-15 DIAGNOSIS — E119 Type 2 diabetes mellitus without complications: Secondary | ICD-10-CM | POA: Diagnosis not present

## 2021-05-15 DIAGNOSIS — Z471 Aftercare following joint replacement surgery: Secondary | ICD-10-CM | POA: Diagnosis not present

## 2021-05-15 DIAGNOSIS — Z96643 Presence of artificial hip joint, bilateral: Secondary | ICD-10-CM | POA: Diagnosis not present

## 2021-05-15 HISTORY — PX: TOTAL KNEE ARTHROPLASTY: SHX125

## 2021-05-15 LAB — BASIC METABOLIC PANEL
Anion gap: 7 (ref 5–15)
BUN: 19 mg/dL (ref 8–23)
CO2: 24 mmol/L (ref 22–32)
Calcium: 9.2 mg/dL (ref 8.9–10.3)
Chloride: 107 mmol/L (ref 98–111)
Creatinine, Ser: 0.82 mg/dL (ref 0.44–1.00)
GFR, Estimated: >60 mL/min (ref >60)
Glucose, Bld: 131 mg/dL — ABNORMAL HIGH (ref 70–99)
Potassium: 3.9 mmol/L (ref 3.5–5.1)
Sodium: 138 mmol/L (ref 135–145)

## 2021-05-15 LAB — CBC WITH DIFFERENTIAL/PLATELET
Abs Immature Granulocytes: 0.02 10*3/uL (ref 0.00–0.07)
Basophils Absolute: 0 10*3/uL (ref 0.0–0.1)
Basophils Relative: 1 %
Eosinophils Absolute: 0.1 10*3/uL (ref 0.0–0.5)
Eosinophils Relative: 2 %
HCT: 42.4 % (ref 36.0–46.0)
Hemoglobin: 14 g/dL (ref 12.0–15.0)
Immature Granulocytes: 0 %
Lymphocytes Relative: 38 %
Lymphs Abs: 2.3 10*3/uL (ref 0.7–4.0)
MCH: 30.1 pg (ref 26.0–34.0)
MCHC: 33 g/dL (ref 30.0–36.0)
MCV: 91.2 fL (ref 80.0–100.0)
Monocytes Absolute: 0.5 10*3/uL (ref 0.1–1.0)
Monocytes Relative: 8 %
Neutro Abs: 3.1 10*3/uL (ref 1.7–7.7)
Neutrophils Relative %: 51 %
Platelets: 196 10*3/uL (ref 150–400)
RBC: 4.65 MIL/uL (ref 3.87–5.11)
RDW: 13.4 % (ref 11.5–15.5)
WBC: 6 10*3/uL (ref 4.0–10.5)
nRBC: 0 % (ref 0.0–0.2)

## 2021-05-15 LAB — HEMOGLOBIN A1C
Hgb A1c MFr Bld: 6.3 % — ABNORMAL HIGH (ref 4.8–5.6)
Mean Plasma Glucose: 134.11 mg/dL

## 2021-05-15 LAB — GLUCOSE, CAPILLARY
Glucose-Capillary: 143 mg/dL — ABNORMAL HIGH (ref 70–99)
Glucose-Capillary: 127 mg/dL — ABNORMAL HIGH (ref 70–99)
Glucose-Capillary: 225 mg/dL — ABNORMAL HIGH (ref 70–99)
Glucose-Capillary: 199 mg/dL — ABNORMAL HIGH (ref 70–99)

## 2021-05-15 SURGERY — ARTHROPLASTY, KNEE, TOTAL
Anesthesia: Spinal | Site: Knee | Laterality: Right

## 2021-05-15 MED ORDER — PHENYLEPHRINE HCL-NACL 10-0.9 MG/250ML-% IV SOLN
INTRAVENOUS | Status: DC | PRN
  Administered 2021-05-15: 65 ug/min via INTRAVENOUS

## 2021-05-15 MED ORDER — DIPHENHYDRAMINE HCL 12.5 MG/5ML PO ELIX: 12.5000 mg | ORAL_SOLUTION | ORAL | Status: DC | PRN

## 2021-05-15 MED ORDER — TRANEXAMIC ACID-NACL 1000-0.7 MG/100ML-% IV SOLN
1000.0000 mg | INTRAVENOUS | Status: DC
  Filled 2021-05-15: qty 100

## 2021-05-15 MED ORDER — FENTANYL CITRATE (PF) 100 MCG/2ML IJ SOLN
INTRAMUSCULAR | Status: AC
  Filled 2021-05-15: qty 2

## 2021-05-15 MED ORDER — IRBESARTAN 150 MG PO TABS
300.0000 mg | ORAL_TABLET | Freq: Every day | ORAL | Status: DC
  Administered 2021-05-16 – 2021-05-17 (×2): 300 mg via ORAL
  Filled 2021-05-15 (×2): qty 2

## 2021-05-15 MED ORDER — 0.9 % SODIUM CHLORIDE (POUR BTL) OPTIME
TOPICAL | Status: DC | PRN
  Administered 2021-05-15: 1000 mL

## 2021-05-15 MED ORDER — FENTANYL CITRATE (PF) 100 MCG/2ML IJ SOLN
INTRAMUSCULAR | Status: DC | PRN
  Administered 2021-05-15 (×2): 50 ug via INTRAVENOUS

## 2021-05-15 MED ORDER — SODIUM CHLORIDE 0.9 % IV SOLN: Freq: Once | INTRAVENOUS | Status: DC

## 2021-05-15 MED ORDER — POLYETHYLENE GLYCOL 3350 17 G PO PACK
17.0000 g | PACK | Freq: Every day | ORAL | 0 refills | Status: DC
Start: 2021-05-15 — End: 2022-03-19

## 2021-05-15 MED ORDER — ASPIRIN 81 MG PO TABS: 81.0000 mg | ORAL_TABLET | Freq: Every day | ORAL | 1 refills | Status: AC

## 2021-05-15 MED ORDER — BUPIVACAINE-EPINEPHRINE (PF) 0.25% -1:200000 IJ SOLN
INTRAMUSCULAR | Status: AC
  Filled 2021-05-15: qty 30

## 2021-05-15 MED ORDER — OXYCODONE HCL 5 MG PO TABS: 5.0000 mg | ORAL_TABLET | ORAL | 0 refills | Status: DC | PRN

## 2021-05-15 MED ORDER — ORAL CARE MOUTH RINSE: 15.0000 mL | Freq: Once | OROMUCOSAL | Status: AC

## 2021-05-15 MED ORDER — POLYETHYLENE GLYCOL 3350 17 G PO PACK: 17.0000 g | PACK | Freq: Every day | ORAL | Status: DC | PRN

## 2021-05-15 MED ORDER — AMLODIPINE BESYLATE 10 MG PO TABS
10.0000 mg | ORAL_TABLET | Freq: Every day | ORAL | Status: DC
  Administered 2021-05-16 – 2021-05-17 (×2): 10 mg via ORAL
  Filled 2021-05-15 (×2): qty 1

## 2021-05-15 MED ORDER — TRAMADOL HCL ER 200 MG PO TB24: 200.0000 mg | ORAL_TABLET | Freq: Every day | ORAL | Status: DC

## 2021-05-15 MED ORDER — BUPIVACAINE HCL (PF) 0.5 % IJ SOLN
INTRAMUSCULAR | Status: DC | PRN
  Administered 2021-05-15: 13 mg via INTRATHECAL

## 2021-05-15 MED ORDER — ACETAMINOPHEN 500 MG PO TABS
1000.0000 mg | ORAL_TABLET | Freq: Four times a day (QID) | ORAL | Status: AC
  Administered 2021-05-15 – 2021-05-16 (×3): 1000 mg via ORAL
  Filled 2021-05-15 (×3): qty 2

## 2021-05-15 MED ORDER — ONDANSETRON HCL 4 MG PO TABS: 4.0000 mg | ORAL_TABLET | Freq: Four times a day (QID) | ORAL | Status: DC | PRN

## 2021-05-15 MED ORDER — LORATADINE 10 MG PO TABS
10.0000 mg | ORAL_TABLET | Freq: Every day | ORAL | Status: DC
  Administered 2021-05-16 – 2021-05-17 (×2): 10 mg via ORAL
  Filled 2021-05-15 (×3): qty 1

## 2021-05-15 MED ORDER — DEXAMETHASONE SODIUM PHOSPHATE 10 MG/ML IJ SOLN
INTRAMUSCULAR | Status: AC
  Filled 2021-05-15: qty 1

## 2021-05-15 MED ORDER — PROPOFOL 500 MG/50ML IV EMUL
INTRAVENOUS | Status: DC | PRN
  Administered 2021-05-15: 100 ug/kg/min via INTRAVENOUS

## 2021-05-15 MED ORDER — MENTHOL 3 MG MT LOZG: 1.0000 | LOZENGE | OROMUCOSAL | Status: DC | PRN

## 2021-05-15 MED ORDER — HYDROMORPHONE HCL 1 MG/ML IJ SOLN
0.5000 mg | INTRAMUSCULAR | Status: DC | PRN
  Administered 2021-05-15 – 2021-05-17 (×3): 1 mg via INTRAVENOUS
  Filled 2021-05-15 (×3): qty 1

## 2021-05-15 MED ORDER — POTASSIUM CHLORIDE IN NACL 20-0.45 MEQ/L-% IV SOLN
INTRAVENOUS | Status: AC
  Filled 2021-05-15 (×2): qty 1000

## 2021-05-15 MED ORDER — ALBUTEROL SULFATE HFA 108 (90 BASE) MCG/ACT IN AERS: 2.0000 | INHALATION_SPRAY | Freq: Four times a day (QID) | RESPIRATORY_TRACT | Status: DC | PRN

## 2021-05-15 MED ORDER — ALUM & MAG HYDROXIDE-SIMETH 200-200-20 MG/5ML PO SUSP: 30.0000 mL | ORAL | Status: DC | PRN

## 2021-05-15 MED ORDER — SODIUM CHLORIDE 0.9 % IR SOLN
Status: DC | PRN
  Administered 2021-05-15: 1000 mL

## 2021-05-15 MED ORDER — DOCUSATE SODIUM 100 MG PO CAPS: 100.0000 mg | ORAL_CAPSULE | Freq: Two times a day (BID) | ORAL | 1 refills | Status: DC | PRN

## 2021-05-15 MED ORDER — LACTATED RINGERS IV SOLN: INTRAVENOUS | Status: DC

## 2021-05-15 MED ORDER — PHENOL 1.4 % MT LIQD
1.0000 | OROMUCOSAL | Status: DC | PRN
Start: 2021-05-15 — End: 2021-05-17

## 2021-05-15 MED ORDER — RISAQUAD PO CAPS
1.0000 | ORAL_CAPSULE | Freq: Every day | ORAL | Status: DC
  Administered 2021-05-15 – 2021-05-17 (×3): 1 via ORAL
  Filled 2021-05-15 (×3): qty 1

## 2021-05-15 MED ORDER — IRRISEPT - 450ML BOTTLE WITH 0.05% CHG IN STERILE WATER, USP 99.95% OPTIME
TOPICAL | Status: DC | PRN
  Administered 2021-05-15: 450 mL via TOPICAL

## 2021-05-15 MED ORDER — SODIUM CHLORIDE 0.9% FLUSH
INTRAVENOUS | Status: DC | PRN
  Administered 2021-05-15: 40 mL
  Administered 2021-05-15: 50 mL

## 2021-05-15 MED ORDER — DEXAMETHASONE SODIUM PHOSPHATE 10 MG/ML IJ SOLN
INTRAMUSCULAR | Status: DC | PRN
  Administered 2021-05-15: 10 mg via INTRAVENOUS

## 2021-05-15 MED ORDER — ONDANSETRON HCL 4 MG/2ML IJ SOLN: 4.0000 mg | Freq: Once | INTRAMUSCULAR | Status: DC | PRN

## 2021-05-15 MED ORDER — ACETAMINOPHEN 325 MG PO TABS
325.0000 mg | ORAL_TABLET | Freq: Four times a day (QID) | ORAL | Status: DC | PRN
Start: 2021-05-16 — End: 2021-05-17
  Administered 2021-05-17: 650 mg via ORAL
  Filled 2021-05-15: qty 2

## 2021-05-15 MED ORDER — OXYCODONE HCL 5 MG PO TABS
10.0000 mg | ORAL_TABLET | ORAL | Status: DC | PRN
  Administered 2021-05-15 – 2021-05-17 (×9): 10 mg via ORAL
  Filled 2021-05-15 (×10): qty 2

## 2021-05-15 MED ORDER — METOCLOPRAMIDE HCL 5 MG PO TABS: 5.0000 mg | ORAL_TABLET | Freq: Three times a day (TID) | ORAL | Status: DC | PRN

## 2021-05-15 MED ORDER — MONTELUKAST SODIUM 10 MG PO TABS
10.0000 mg | ORAL_TABLET | Freq: Every day | ORAL | Status: DC
  Administered 2021-05-16 – 2021-05-17 (×2): 10 mg via ORAL
  Filled 2021-05-15 (×2): qty 1

## 2021-05-15 MED ORDER — METHOCARBAMOL 500 MG PO TABS: 500.0000 mg | ORAL_TABLET | Freq: Four times a day (QID) | ORAL | 1 refills | Status: DC | PRN

## 2021-05-15 MED ORDER — ROPIVACAINE HCL 7.5 MG/ML IJ SOLN
INTRAMUSCULAR | Status: DC | PRN
  Administered 2021-05-15: 20 mL via PERINEURAL

## 2021-05-15 MED ORDER — ACETAMINOPHEN 10 MG/ML IV SOLN: 1000.0000 mg | Freq: Once | INTRAVENOUS | Status: DC | PRN

## 2021-05-15 MED ORDER — BUPIVACAINE LIPOSOME 1.3 % IJ SUSP
20.0000 mL | Freq: Once | INTRAMUSCULAR | Status: AC
  Administered 2021-05-15: 20 mL
  Filled 2021-05-15: qty 20

## 2021-05-15 MED ORDER — DOCUSATE SODIUM 100 MG PO CAPS
100.0000 mg | ORAL_CAPSULE | Freq: Two times a day (BID) | ORAL | Status: DC
  Administered 2021-05-15 – 2021-05-17 (×5): 100 mg via ORAL
  Filled 2021-05-15 (×5): qty 1

## 2021-05-15 MED ORDER — ONDANSETRON HCL 4 MG/2ML IJ SOLN: 4.0000 mg | Freq: Four times a day (QID) | INTRAMUSCULAR | Status: DC | PRN

## 2021-05-15 MED ORDER — CALCIUM ACETATE (PHOS BINDER) 667 MG PO CAPS
667.0000 mg | ORAL_CAPSULE | Freq: Every day | ORAL | Status: DC
  Administered 2021-05-15 – 2021-05-16 (×2): 667 mg via ORAL
  Filled 2021-05-15 (×3): qty 1

## 2021-05-15 MED ORDER — FESOTERODINE FUMARATE ER 4 MG PO TB24
4.0000 mg | ORAL_TABLET | Freq: Every day | ORAL | Status: DC
  Administered 2021-05-16 – 2021-05-17 (×2): 4 mg via ORAL
  Filled 2021-05-15 (×2): qty 1

## 2021-05-15 MED ORDER — PHENYLEPHRINE HCL (PRESSORS) 10 MG/ML IV SOLN
INTRAVENOUS | Status: AC
  Filled 2021-05-15: qty 1

## 2021-05-15 MED ORDER — INSULIN ASPART 100 UNIT/ML IJ SOLN
0.0000 [IU] | Freq: Three times a day (TID) | INTRAMUSCULAR | Status: DC
  Administered 2021-05-15: 5 [IU] via SUBCUTANEOUS
  Administered 2021-05-16 – 2021-05-17 (×4): 2 [IU] via SUBCUTANEOUS

## 2021-05-15 MED ORDER — MIDAZOLAM HCL 2 MG/2ML IJ SOLN
INTRAMUSCULAR | Status: AC
  Filled 2021-05-15: qty 2

## 2021-05-15 MED ORDER — PROPOFOL 1000 MG/100ML IV EMUL
INTRAVENOUS | Status: AC
  Filled 2021-05-15: qty 100

## 2021-05-15 MED ORDER — CHLORHEXIDINE GLUCONATE 0.12 % MT SOLN
15.0000 mL | Freq: Once | OROMUCOSAL | Status: AC
  Administered 2021-05-15: 15 mL via OROMUCOSAL

## 2021-05-15 MED ORDER — MAGNESIUM CITRATE PO SOLN: 1.0000 | Freq: Once | ORAL | Status: DC | PRN

## 2021-05-15 MED ORDER — BUPIVACAINE-EPINEPHRINE 0.25% -1:200000 IJ SOLN
INTRAMUSCULAR | Status: DC | PRN
  Administered 2021-05-15: 20 mL

## 2021-05-15 MED ORDER — ACETAMINOPHEN 10 MG/ML IV SOLN
1000.0000 mg | INTRAVENOUS | Status: AC
  Administered 2021-05-15: 1000 mg via INTRAVENOUS
  Filled 2021-05-15: qty 100

## 2021-05-15 MED ORDER — BISACODYL 5 MG PO TBEC: 5.0000 mg | DELAYED_RELEASE_TABLET | Freq: Every day | ORAL | Status: DC | PRN

## 2021-05-15 MED ORDER — ONDANSETRON HCL 4 MG/2ML IJ SOLN
INTRAMUSCULAR | Status: AC
  Filled 2021-05-15: qty 2

## 2021-05-15 MED ORDER — MIDAZOLAM HCL 5 MG/5ML IJ SOLN
INTRAMUSCULAR | Status: DC | PRN
  Administered 2021-05-15: 2 mg via INTRAVENOUS

## 2021-05-15 MED ORDER — METOCLOPRAMIDE HCL 5 MG/ML IJ SOLN: 5.0000 mg | Freq: Three times a day (TID) | INTRAMUSCULAR | Status: DC | PRN

## 2021-05-15 MED ORDER — PROPOFOL 10 MG/ML IV BOLUS
INTRAVENOUS | Status: AC
  Filled 2021-05-15: qty 20

## 2021-05-15 MED ORDER — FENTANYL CITRATE (PF) 100 MCG/2ML IJ SOLN: 25.0000 ug | INTRAMUSCULAR | Status: DC | PRN

## 2021-05-15 MED ORDER — VANCOMYCIN HCL 1250 MG/250ML IV SOLN
1250.0000 mg | Freq: Once | INTRAVENOUS | Status: AC
  Administered 2021-05-15: 1250 mg via INTRAVENOUS
  Filled 2021-05-15: qty 250

## 2021-05-15 MED ORDER — OXYCODONE HCL 5 MG PO TABS: 5.0000 mg | ORAL_TABLET | ORAL | Status: DC | PRN

## 2021-05-15 MED ORDER — CLONIDINE HCL (ANALGESIA) 100 MCG/ML EP SOLN
EPIDURAL | Status: DC | PRN
  Administered 2021-05-15: 100 ug

## 2021-05-15 MED ORDER — ONDANSETRON HCL 4 MG/2ML IJ SOLN
INTRAMUSCULAR | Status: DC | PRN
  Administered 2021-05-15: 4 mg via INTRAVENOUS

## 2021-05-15 MED ORDER — ASPIRIN 81 MG PO CHEW
81.0000 mg | CHEWABLE_TABLET | Freq: Two times a day (BID) | ORAL | Status: DC
  Administered 2021-05-16 – 2021-05-17 (×3): 81 mg via ORAL
  Filled 2021-05-15 (×3): qty 1

## 2021-05-15 SURGICAL SUPPLY — 79 items
ATTUNE MED DOME PAT 38 KNEE (Knees) ×2 IMPLANT
ATTUNE PS FEM RT SZ 5 CEM KNEE (Femur) ×2 IMPLANT
ATTUNE PSRP INSR SZ5 5 KNEE (Insert) ×2 IMPLANT
BAG DECANTER FOR FLEXI CONT (MISCELLANEOUS) ×2 IMPLANT
BAG ZIPLOCK 12X15 (MISCELLANEOUS) ×2 IMPLANT
BASE TIBIAL ROT PLAT SZ 5 KNEE (Knees) ×1 IMPLANT
BLADE SAW SGTL 11.0X1.19X90.0M (BLADE) ×2 IMPLANT
BLADE SAW SGTL 13.0X1.19X90.0M (BLADE) ×2 IMPLANT
BLADE SURG SZ10 CARB STEEL (BLADE) ×4 IMPLANT
BNDG COHESIVE 4X5 TAN STRL (GAUZE/BANDAGES/DRESSINGS) ×2 IMPLANT
BNDG ELASTIC 4X5.8 VLCR STR LF (GAUZE/BANDAGES/DRESSINGS) ×2 IMPLANT
BNDG ELASTIC 6X5.8 VLCR STR LF (GAUZE/BANDAGES/DRESSINGS) ×2 IMPLANT
CEMENT HV SMART SET (Cement) ×4 IMPLANT
COVER SURGICAL LIGHT HANDLE (MISCELLANEOUS) ×2 IMPLANT
COVER WAND RF STERILE (DRAPES) IMPLANT
CUFF TOURN SGL QUICK 34 (TOURNIQUET CUFF) ×2
CUFF TRNQT CYL 34X4.125X (TOURNIQUET CUFF) ×1 IMPLANT
DECANTER SPIKE VIAL GLASS SM (MISCELLANEOUS) ×2 IMPLANT
DRAPE INCISE IOBAN 66X45 STRL (DRAPES) IMPLANT
DRAPE ORTHO SPLIT 77X108 STRL (DRAPES) ×4
DRAPE SHEET LG 3/4 BI-LAMINATE (DRAPES) ×4 IMPLANT
DRAPE SURG ORHT 6 SPLT 77X108 (DRAPES) ×2 IMPLANT
DRAPE TOP 10253 STERILE (DRAPES) ×2 IMPLANT
DRAPE U-SHAPE 47X51 STRL (DRAPES) ×2 IMPLANT
DRSG AQUACEL AG ADV 3.5X10 (GAUZE/BANDAGES/DRESSINGS) ×2 IMPLANT
DRSG TEGADERM 4X4.75 (GAUZE/BANDAGES/DRESSINGS) IMPLANT
DURAPREP 26ML APPLICATOR (WOUND CARE) ×2 IMPLANT
ELECT BLADE TIP CTD 4 INCH (ELECTRODE) ×2 IMPLANT
ELECT REM PT RETURN 15FT ADLT (MISCELLANEOUS) ×2 IMPLANT
EVACUATOR 1/8 PVC DRAIN (DRAIN) IMPLANT
GAUZE SPONGE 2X2 8PLY STRL LF (GAUZE/BANDAGES/DRESSINGS) IMPLANT
GLOVE SRG 8 PF TXTR STRL LF DI (GLOVE) ×1 IMPLANT
GLOVE SURG POLYISO LF SZ7.5 (GLOVE) ×4 IMPLANT
GLOVE SURG POLYISO LF SZ8 (GLOVE) ×4 IMPLANT
GLOVE SURG UNDER POLY LF SZ7.5 (GLOVE) ×2 IMPLANT
GLOVE SURG UNDER POLY LF SZ8 (GLOVE) ×2
GOWN STRL REUS W/TWL XL LVL3 (GOWN DISPOSABLE) ×4 IMPLANT
HANDPIECE INTERPULSE COAX TIP (DISPOSABLE) ×2
HEMOSTAT SPONGE AVITENE ULTRA (HEMOSTASIS) IMPLANT
HOLDER FOLEY CATH W/STRAP (MISCELLANEOUS) ×2 IMPLANT
IMMOBILIZER KNEE 20 (SOFTGOODS) ×2
IMMOBILIZER KNEE 20 THIGH 36 (SOFTGOODS) ×1 IMPLANT
JET LAVAGE IRRISEPT WOUND (IRRIGATION / IRRIGATOR) ×2
KIT TURNOVER KIT A (KITS) ×2 IMPLANT
LAVAGE JET IRRISEPT WOUND (IRRIGATION / IRRIGATOR) ×1 IMPLANT
MANIFOLD NEPTUNE II (INSTRUMENTS) ×2 IMPLANT
NDL SAFETY ECLIPSE 18X1.5 (NEEDLE) IMPLANT
NEEDLE HYPO 18GX1.5 SHARP (NEEDLE)
NS IRRIG 1000ML POUR BTL (IV SOLUTION) IMPLANT
PACK TOTAL KNEE CUSTOM (KITS) ×2 IMPLANT
PIN DRILL FIX HALF THREAD (BIT) ×2 IMPLANT
PIN STEINMAN FIXATION KNEE (PIN) ×2 IMPLANT
PROTECTOR NERVE ULNAR (MISCELLANEOUS) ×2 IMPLANT
SAW OSC TIP CART 19.5X105X1.3 (SAW) ×2 IMPLANT
SEALER BIPOLAR AQUA 6.0 (INSTRUMENTS) ×2 IMPLANT
SET HNDPC FAN SPRY TIP SCT (DISPOSABLE) ×1 IMPLANT
SPONGE GAUZE 2X2 STER 10/PKG (GAUZE/BANDAGES/DRESSINGS)
SPONGE SURGIFOAM ABS GEL 100 (HEMOSTASIS) IMPLANT
STAPLER VISISTAT (STAPLE) IMPLANT
STRIP CLOSURE SKIN 1/2X4 (GAUZE/BANDAGES/DRESSINGS) ×4 IMPLANT
SUT BONE WAX W31G (SUTURE) ×2 IMPLANT
SUT MNCRL AB 4-0 PS2 18 (SUTURE) IMPLANT
SUT STRATAFIX 0 PDS 27 VIOLET (SUTURE) ×2
SUT VIC AB 1 CT1 27 (SUTURE) ×4
SUT VIC AB 1 CT1 27XBRD ANTBC (SUTURE) ×2 IMPLANT
SUT VIC AB 1 CTX 36 (SUTURE)
SUT VIC AB 1 CTX36XBRD ANBCTR (SUTURE) IMPLANT
SUT VIC AB 2-0 CT1 27 (SUTURE) ×6
SUT VIC AB 2-0 CT1 TAPERPNT 27 (SUTURE) ×3 IMPLANT
SUTURE STRATFX 0 PDS 27 VIOLET (SUTURE) ×1 IMPLANT
SYR 3ML LL SCALE MARK (SYRINGE) IMPLANT
SYR 50ML LL SCALE MARK (SYRINGE) IMPLANT
TIBIAL BASE ROT PLAT SZ 5 KNEE (Knees) ×2 IMPLANT
TOWER CARTRIDGE SMART MIX (DISPOSABLE) ×2 IMPLANT
TRAY FOLEY MTR SLVR 14FR STAT (SET/KITS/TRAYS/PACK) ×2 IMPLANT
TRAY FOLEY MTR SLVR 16FR STAT (SET/KITS/TRAYS/PACK) IMPLANT
WATER STERILE IRR 1000ML POUR (IV SOLUTION) ×4 IMPLANT
WIPE CHG CHLORHEXIDINE 2% (PERSONAL CARE ITEMS) ×2 IMPLANT
WRAP KNEE MAXI GEL POST OP (GAUZE/BANDAGES/DRESSINGS) ×2 IMPLANT

## 2021-05-15 NOTE — Evaluation (Signed)
Physical Therapy Evaluation Patient Details Name: Tanya [name] MRN: [id number] DOB: [birth year] Today's Date: 05/15/2021   History of Present Illness  Patient is 65 y.o. female s/p Rt TKA on 05/15/21 with PMH significant forDM, HTN, OA, Rt THA in 2017, Lt TKA in 2015.     Clinical Impression  Tanya [name] is a 65 y.o. female POD 0 s/p Rt TKA. Patient reports independence with mobility at baseline. Patient is now limited by functional impairments (see PT problem list below) and requires min-mod assist for transfers and gait with RW. Patient was able to ambulate ~4 feet with RW and min assist but greatly limited by pain this date and unable to ambulate greater distance. Patient instructed in exercise to facilitate circulation to manage edema and reduce risk of DVT. Patient will benefit from continued skilled PT interventions to address impairments and progress towards PLOF. Acute PT will follow to progress mobility and stair training in preparation for safe discharge home.     Follow Up Recommendations Follow surgeon's recommendation for DC plan and follow-up therapies;Home health PT    Equipment Recommendations       Recommendations for Other Services       Precautions / Restrictions Precautions Precautions: Fall Restrictions Weight Bearing Restrictions: No Other Position/Activity Restrictions: WBAT      Mobility  Bed Mobility Overal bed mobility: Needs Assistance Bed Mobility: Supine to Sit     Supine to sit: Min assist;HOB elevated     General bed mobility comments: cues to use bed rail and assist for Rt LE to move to EOB and to raise trunk. pt able to scoot to EOB.    Transfers Overall transfer level: Needs assistance Equipment used: Rolling walker (2 wheeled) Transfers: Sit to/from Stand Sit to Stand: Min assist;Mod assist;From elevated surface         General transfer comment: Cues for safe hand placement with RW, min-mod assist to initiate power up from EOB  and steady with rise. cues for safe reach back and to extend Rt LE to prevent excessive flexion with sitting.  Ambulation/Gait Ambulation/Gait assistance: Min assist Gait Distance (Feet): 4 Feet Assistive device: Rolling walker (2 wheeled) Gait Pattern/deviations: Step-to pattern;Decreased stride length;Decreased weight shift to right;Antalgic Gait velocity: decr   General Gait Details: pt able to take several small steps forward with min assist, no buckling at Rt knee noted. Pt limited by pain and recliner provided for seated rest.  Stairs            Wheelchair Mobility    Modified Rankin (Stroke Patients Only)       Balance Overall balance assessment: Needs assistance Sitting-balance support: Feet supported Sitting balance-Leahy Scale: Good     Standing balance support: During functional activity;Bilateral upper extremity supported Standing balance-Leahy Scale: Poor Standing balance comment: reliant on RW and support from therapist.                             Pertinent Vitals/Pain Pain Assessment: 0-10 Pain Score: 8  Pain Location: Rt knee Pain Descriptors / Indicators: Burning;Aching;Discomfort Pain Intervention(s): Monitored during session;Limited activity within patient's tolerance;Repositioned;Patient requesting pain meds-RN notified;Ice applied    Home Living Family/patient expects to be discharged to:: Private residence Living Arrangements: Spouse/significant other Available Help at Discharge: Family Type of Home: House Home Access: Stairs to enter Entrance Stairs-Rails: Psychiatric nurse of Steps: [place]: Two level;Bed/bath upstairs;Able to live on main level with bedroom/bathroom;1/2 bath  on main level Home Equipment: Walker - 2 wheels      Prior Function Level of Independence: Independent               Hand Dominance   Dominant Hand: Right    Extremity/Trunk Assessment   Upper Extremity  Assessment Upper Extremity Assessment: Overall WFL for tasks assessed    Lower Extremity Assessment Lower Extremity Assessment: RLE deficits/detail RLE Deficits / Details: good quad activation, no extensor lag wtih SLR, elevation limited by pain RLE Sensation: WNL RLE Coordination: WNL    Cervical / Trunk Assessment Cervical / Trunk Assessment: Normal  Communication   Communication: No difficulties  Cognition Arousal/Alertness: Awake/alert Behavior During Therapy: WFL for tasks assessed/performed Overall Cognitive Status: Within Functional Limits for tasks assessed                                        General Comments      Exercises Total Joint Exercises Ankle Circles/Pumps: AROM;Both;15 reps;Seated   Assessment/Plan    PT Assessment Patient needs continued PT services  PT Problem List Decreased strength;Decreased range of motion;Decreased activity tolerance;Decreased balance;Decreased mobility;Decreased knowledge of use of DME;Decreased knowledge of precautions;Pain       PT Treatment Interventions DME instruction;Gait training;Stair training;Functional mobility training;Therapeutic activities;Therapeutic exercise;Balance training;Patient/family education    PT Goals (Current goals can be found in the Care Plan section)  Acute Rehab PT Goals Patient Stated Goal: stop hurting PT Goal Formulation: With patient Time For Goal Achievement: 05/22/21 Potential to Achieve Goals: Good    Frequency 7X/week   Barriers to discharge        Co-evaluation               AM-PAC PT "6 Clicks" Mobility  Outcome Measure Help needed turning from your back to your side while in a flat bed without using bedrails?: A Little Help needed moving from lying on your back to sitting on the side of a flat bed without using bedrails?: A Little Help needed moving to and from a bed to a chair (including a wheelchair)?: A Lot Help needed standing up from a chair using  your arms (e.g., wheelchair or bedside chair)?: A Lot Help needed to walk in hospital room?: A Little Help needed climbing 3-5 steps with a railing? : A Lot 6 Click Score: 15    End of Session Equipment Utilized During Treatment: Gait belt Activity Tolerance: Patient limited by pain Patient left: in chair;with call bell/phone within reach;with chair alarm set Nurse Communication: Mobility status;Patient requests pain meds PT Visit Diagnosis: Muscle weakness (generalized) (M62.81);Difficulty in walking, not elsewhere classified (R26.2)    Time: 2836-6294 PT Time Calculation (min) (ACUTE ONLY): 21 min   Charges:   PT Evaluation $PT Eval Low Complexity: 1 Low          Verner Mould, DPT Acute Rehabilitation Services Office [phone] Pager [phone]   Jacques Navy 05/15/2021, 6:19 PM

## 2021-05-15 NOTE — Interval H&P Note (Signed)
History and Physical Interval Note:  05/15/2021 7:19 AM  Tanya [name]  has presented today for surgery, with the diagnosis of Right knee degenerative joint disease.  The various methods of treatment have been discussed with the patient and family. After consideration of risks, benefits and other options for treatment, the patient has consented to  Procedure(s) with comments: TOTAL KNEE ARTHROPLASTY (Right) - 2.5 hrs as a surgical intervention.  The patient's history has been reviewed, patient examined, no change in status, stable for surgery.  I have reviewed the patient's chart and labs.  Questions were answered to the patient's satisfaction.     Johnn Hai

## 2021-05-15 NOTE — Anesthesia Procedure Notes (Signed)
Spinal  Patient location during procedure: OR Start time: 05/15/2021 7:35 AM End time: 05/15/2021 7:40 AM Reason for block: surgical anesthesia Staffing Anesthesiologist: Barnet Glasgow, MD Preanesthetic Checklist Completed: patient identified, IV checked, site marked, risks and benefits discussed, surgical consent, monitors and equipment checked, pre-op evaluation and timeout performed Spinal Block Patient position: sitting Prep: DuraPrep Patient monitoring: heart rate, cardiac monitor, continuous pulse ox and blood pressure Approach: midline Location: L3-4 Injection technique: single-shot Needle Needle type: Sprotte  Needle gauge: 24 G Needle length: 9 cm Needle insertion depth: 6 cm Assessment Sensory level: T4 Events: CSF return Additional Notes 1 attempt

## 2021-05-15 NOTE — Anesthesia Procedure Notes (Signed)
Procedure Name: MAC Date/Time: 05/15/2021 7:32 AM Performed by: Lissa Morales, CRNA Pre-anesthesia Checklist: Patient identified, Emergency Drugs available, Suction available and Patient being monitored Patient Re-evaluated:Patient Re-evaluated prior to induction Oxygen Delivery Method: Simple face mask Placement Confirmation: positive ETCO2

## 2021-05-15 NOTE — Anesthesia Procedure Notes (Signed)
Anesthesia Regional Block: Adductor canal block   Pre-Anesthetic Checklist: , timeout performed,  Correct Patient, Correct Site, Correct Laterality,  Correct Procedure, Correct Position, site marked,  Risks and benefits discussed,  Surgical consent,  Pre-op evaluation,  At surgeon's request and post-op pain management  Laterality: Lower and Right  Prep: chloraprep       Needles:  Injection technique: Single-shot  Needle Type: Echogenic Needle     Needle Length: 9cm  Needle Gauge: 22     Additional Needles:   Procedures:,,,, ultrasound used (permanent image in chart),,    Narrative:  Start time: 05/15/2021 7:06 AM End time: 05/15/2021 7:11 AM Injection made incrementally with aspirations every 5 mL.  Performed by: Personally  Anesthesiologist: Barnet Glasgow, MD  Additional Notes: Block assessed prior to surgery. Pt tolerated procedure well.

## 2021-05-15 NOTE — Discharge Instructions (Signed)
Elevate leg above heart 6x a day for 8minutes each Use knee immobilizer while walking until can SLR x 10 Use knee immobilizer in bed to keep knee in extension Aquacel dressing may remain in place until follow up. May shower with aquacel dressing in place. If the dressing becomes saturated or peels off, you may remove aquacel dressing. Do not remove steri-strips if they are present. Place new dressing with gauze and tape or ACE bandage which should be kept clean and dry and changed daily.   INSTRUCTIONS AFTER JOINT REPLACEMENT   Remove items at home which could result in a fall. This includes throw rugs or furniture in walking pathways ICE to the affected joint every three hours while awake for 30 minutes at a time, for at least the first 3-5 days, and then as needed for pain and swelling.  Continue to use ice for pain and swelling. You may notice swelling that will progress down to the foot and ankle.  This is normal after surgery.  Elevate your leg when you are not up walking on it.   Continue to use the breathing machine you got in the hospital (incentive spirometer) which will help keep your temperature down.  It is common for your temperature to cycle up and down following surgery, especially at night when you are not up moving around and exerting yourself.  The breathing machine keeps your lungs expanded and your temperature down.   DIET:  As you were doing prior to hospitalization, we recommend a well-balanced diet.  DRESSING / WOUND CARE / SHOWERING  Keep the surgical dressing until follow up.  The dressing is water proof, so you can shower without any extra covering.  IF THE DRESSING FALLS OFF or the wound gets wet inside, change the dressing with sterile gauze.  Please use good hand washing techniques before changing the dressing.  Do not use any lotions or creams on the incision until instructed by your surgeon.    ACTIVITY  Increase activity slowly as tolerated, but follow the weight  bearing instructions below.   No driving for 6 weeks or until further direction given by your physician.  You cannot drive while taking narcotics.  No lifting or carrying greater than 10 lbs. until further directed by your surgeon. Avoid periods of inactivity such as sitting longer than an hour when not asleep. This helps prevent blood clots.  You may return to work once you are authorized by your doctor.     WEIGHT BEARING   Weight bearing as tolerated with assist device (walker, cane, etc) as directed, use it as long as suggested by your surgeon or therapist, typically at least 4-6 weeks.   EXERCISES  Results after joint replacement surgery are often greatly improved when you follow the exercise, range of motion and muscle strengthening exercises prescribed by your doctor. Safety measures are also important to protect the joint from further injury. Any time any of these exercises cause you to have increased pain or swelling, decrease what you are doing until you are comfortable again and then slowly increase them. If you have problems or questions, call your caregiver or physical therapist for advice.   Rehabilitation is important following a joint replacement. After just a few days of immobilization, the muscles of the leg can become weakened and shrink (atrophy).  These exercises are designed to build up the tone and strength of the thigh and leg muscles and to improve motion. Often times heat used for twenty to thirty  minutes before working out will loosen up your tissues and help with improving the range of motion but do not use heat for the first two weeks following surgery (sometimes heat can increase post-operative swelling).   These exercises can be done on a training (exercise) mat, on the floor, on a table or on a bed. Use whatever works the best and is most comfortable for you.    Use music or television while you are exercising so that the exercises are a pleasant break in your day.  This will make your life better with the exercises acting as a break in your routine that you can look forward to.   Perform all exercises about fifteen times, three times per day or as directed.  You should exercise both the operative leg and the other leg as well.  Exercises include:   Quad Sets - Tighten up the muscle on the front of the thigh (Quad) and hold for 5-10 seconds.   Straight Leg Raises - With your knee straight (if you were given a brace, keep it on), lift the leg to 60 degrees, hold for 3 seconds, and slowly lower the leg.  Perform this exercise against resistance later as your leg gets stronger.  Leg Slides: Lying on your back, slowly slide your foot toward your buttocks, bending your knee up off the floor (only go as far as is comfortable). Then slowly slide your foot back down until your leg is flat on the floor again.  Angel Wings: Lying on your back spread your legs to the side as far apart as you can without causing discomfort.  Hamstring Strength:  Lying on your back, push your heel against the floor with your leg straight by tightening up the muscles of your buttocks.  Repeat, but this time bend your knee to a comfortable angle, and push your heel against the floor.  You may put a pillow under the heel to make it more comfortable if necessary.   A rehabilitation program following joint replacement surgery can speed recovery and prevent re-injury in the future due to weakened muscles. Contact your doctor or a physical therapist for more information on knee rehabilitation.    CONSTIPATION  Constipation is defined medically as fewer than three stools per week and severe constipation as less than one stool per week.  Even if you have a regular bowel pattern at home, your normal regimen is likely to be disrupted due to multiple reasons following surgery.  Combination of anesthesia, postoperative narcotics, change in appetite and fluid intake all can affect your bowels.   YOU MUST  use at least one of the following options; they are listed in order of increasing strength to get the job done.  They are all available over the counter, and you may need to use some, POSSIBLY even all of these options:    Drink plenty of fluids (prune juice may be helpful) and high fiber foods Colace 100 mg by mouth twice a day  Senokot for constipation as directed and as needed Dulcolax (bisacodyl), take with full glass of water  Miralax (polyethylene glycol) once or twice a day as needed.  If you have tried all these things and are unable to have a bowel movement in the first 3-4 days after surgery call either your surgeon or your primary doctor.    If you experience loose stools or diarrhea, hold the medications until you stool forms back up.  If your symptoms do not get better within  1 week or if they get worse, check with your doctor.  If you experience "the worst abdominal pain ever" or develop nausea or vomiting, please contact the office immediately for further recommendations for treatment.   ITCHING:  If you experience itching with your medications, try taking only a single pain pill, or even half a pain pill at a time.  You can also use Benadryl over the counter for itching or also to help with sleep.   TED HOSE STOCKINGS:  Use stockings on both legs until for at least 2 weeks or as directed by physician office. They may be removed at night for sleeping.  MEDICATIONS:  See your medication summary on the "After Visit Summary" that nursing will review with you.  You may have some home medications which will be placed on hold until you complete the course of blood thinner medication.  It is important for you to complete the blood thinner medication as prescribed.  PRECAUTIONS:  If you experience chest pain or shortness of breath - call 911 immediately for transfer to the hospital emergency department.   If you develop a fever greater that 101 F, purulent drainage from wound, increased  redness or drainage from wound, foul odor from the wound/dressing, or calf pain - CONTACT YOUR SURGEON.                                                   FOLLOW-UP APPOINTMENTS:  If you do not already have a post-op appointment, please call the office for an appointment to be seen by your surgeon.  Guidelines for how soon to be seen are listed in your "After Visit Summary", but are typically between 1-4 weeks after surgery.  OTHER INSTRUCTIONS:   Knee Replacement:  Do not place pillow under knee, focus on keeping the knee straight while resting. CPM instructions: 0-90 degrees, 2 hours in the morning, 2 hours in the afternoon, and 2 hours in the evening. Place foam block, curve side up under heel at all times except when in CPM or when walking.  DO NOT modify, tear, cut, or change the foam block in any way.  POST-OPERATIVE OPIOID TAPER INSTRUCTIONS: It is important to wean off of your opioid medication as soon as possible. If you do not need pain medication after your surgery it is ok to stop day one. Opioids include: Codeine, Hydrocodone(Norco, Vicodin), Oxycodone(Percocet, oxycontin) and hydromorphone amongst others.  Long term and even short term use of opiods can cause: Increased pain response Dependence Constipation Depression Respiratory depression And more.  Withdrawal symptoms can include Flu like symptoms Nausea, vomiting And more Techniques to manage these symptoms Hydrate well Eat regular healthy meals Stay active Use relaxation techniques(deep breathing, meditating, yoga) Do Not substitute Alcohol to help with tapering If you have been on opioids for less than two weeks and do not have pain than it is ok to stop all together.  Plan to wean off of opioids This plan should start within one week post op of your joint replacement. Maintain the same interval or time between taking each dose and first decrease the dose.  Cut the total daily intake of opioids by one tablet each  day Next start to increase the time between doses. The last dose that should be eliminated is the evening dose.   MAKE SURE YOU:  Understand  these instructions.  Get help right away if you are not doing well or get worse.    Thank you for letting us be a part of your medical care team.  It is a privilege we respect greatly.  We hope these instructions will help you stay on track for a fast and full recovery!      

## 2021-05-15 NOTE — Transfer of Care (Signed)
Immediate Anesthesia Transfer of Care Note  Patient: Tanya [name]  Procedure(s) Performed: TOTAL KNEE ARTHROPLASTY (Right: Knee)  Patient Location: PACU  Anesthesia Type:Spinal  Level of Consciousness: awake, alert , oriented and patient cooperative  Airway & Oxygen Therapy: Patient Spontanous Breathing and Patient connected to face mask oxygen  Post-op Assessment: Report given to RN and Post -op Vital signs reviewed and stable  Post vital signs: stable  Last Vitals:  Vitals Value Taken Time  BP 86/59 05/15/21 1035  Temp    Pulse 74 05/15/21 1040  Resp 16 05/15/21 1040  SpO2 96 % 05/15/21 1040  Vitals shown include unvalidated device data.  Last Pain:  Vitals:   05/15/21 0609  TempSrc:   PainSc: 0-No pain         Complications: No notable events documented.

## 2021-05-15 NOTE — Op Note (Signed)
NAME: Tanya [name], Tanya [name]. MEDICAL RECORD NO: [id number] ACCOUNT NO: [id number] DATE OF BIRTH: 1955-12-11 FACILITY: [facility] LOCATION: [facility] PHYSICIAN: Johnn Hai, MD  Operative Report   DATE OF PROCEDURE: 05/15/2021  PREOPERATIVE DIAGNOSIS:  End-stage osteoarthrosis of the right knee with varus deformity and multiple osteophytes.  POSTOPERATIVE DIAGNOSIS:  End-stage osteoarthrosis of the right knee with varus deformity and multiple osteophytes.  PROCEDURE PERFORMED:   1.  Right total knee arthroplasty utilizing DePuy Attune rotating platform, 5 femur, 5 tibia, 5 insert, 38 patella. 2.  Removal of multiple loose bodies.  ANESTHESIA:  Spinal.   ASSISTANT: Lacie Draft, PA  HISTORY:  This is [name] 65 year old female with end-stage osteoarthrosis, right knee, medial compartment, varus deformity, multiple osteophytes. It was indicated for replacement of degenerated joint.  Risks and benefits discussed including bleeding,  infection, damage to neurovascular structures, no change in symptoms, worsening symptoms, DVT, PE, anesthetic complications, etc.  DESCRIPTION OF PROCEDURE:  With the patient in supine position, after induction of adequate spinal anesthesia, vancomycin and gentamicin for antimicrobial prophylaxis, the right lower extremity was prepped and draped, exsanguinated in the usual sterile  fashion.  Thigh tourniquet inflated to 200 mmHg.  Midline incision was then made over the knee.  Full thickness flaps developed.  Median parapatellar arthrotomy performed.  The inferior portion of the patella was adhesed.  We excised the fat pad and  elevated some adhesions of the infrapatellar ligament.  I removed two osteophytes laterally, was able to evert the patella, gently flexed the knee.  Tricompartmental osteoarthrosis was noted, particularly the medial compartment with bone-on-bone as well  as in the patellofemoral joint.  Osteophytes were meticulously removed with [name] bare and [name] Leksell  rongeur.  Remnants of the medial and lateral menisci were excised as well.  I placed [name] starting hole just above the femoral notch near the insertion of the  anterior portion of the PCL.  I irrigated the canal, used [name] T-handle and then intramedullary guide with [name] 10 off the distal femur due to [name] flexion contracture, 5 degrees.  This was then pinned and distal femoral cut was then performed.  She had  significantly eburnated bone throughout.  Following this, the femur was sized to [name] 5 off the anterior cortex with 3 degrees of external rotation.  This was then pinned, and we placed [name] distal femoral cutting block and checked posteriorly.  This appeared  to be [name] satisfactory cut.  There was an adhesed large osteophyte measuring 2 x 1.5 cm that was skeletonized and removed from the lateral suprapatellar region.  I had elevated soft tissues medially in preserving the MCL as well.  I then performed our  anterior, posterior and chamfer cuts with soft tissues protected posteriorly at all times with curved Crego.  Remnants removed with an osteotome.  Multiple loose bodies were retrieved from the posterior region.  Following this, I then subluxed the tibia.   Eburnated bone medially.  Low point was posteromedially.  Utilizing external alignment guide to off the defect, which was medial 3-degree slope parallel to the shaft bisecting the tibiotalar joint.  This was then pinned, and we performed our proximal  tibia cut.  Again, soft tissues protected posteriorly at all times.  Following this, I sized the proximal tibia to [name] 5.  Maximized the coverage.  This was then pinned.  I harvested bone, essentially from the tibial canal and impacted into the distal  femur.  I then utilized our central drill and [name] punch guide.  Following this, I turned attention to the femur for our box cut.  This was applied bisecting the canal, pinned and our box cut performed and rasped.  [name] trial 5 femur was then placed and  drilled our lug holes.   Placed [name] 5 insert and reduced the knee. I had full extension and full flexion.  I then turned attention to the patella, which was thinned and fairly eburnated.  Osteophytes had been removed.  It was then measured to [name] 21.  I  placed the external patellar jig with additional spacers and planed to [name] 14 mm thickness utilizing the oscillating saw. This was then measured to [name] 38.  I then drilled the peg holes and medialized those.  Trial patella was then placed and it was reduced,  had excellent patellofemoral tracking and full extension and full flexion, and good stability with varus and valgus stressing at 0 and 30 degrees.  All trials were then removed.  I checked posteriorly.  Any meniscal remnants were excised.  We cauterized  the geniculates.  Pulsatile lavage was then utilized to irrigate the bone and the surgical site.  The knee was then reflexed.  All surfaces thoroughly dried.  Cement was mixed on the back table in appropriate fashion.  This was then injected into the  tibial canal, digitally pressurizing it.  We cemented the 5 tibial tray with cement placed on the tray, impacted it with redundant cement removed.  I cemented and impacted the femur as well.  I placed [name] 5 trial insert and reduced it, held in axial load  throughout the curing of the cement in full extension.  Redundant cement was removed.  I cemented and clamped the patella as well.  Following this, 0.25% Marcaine with epinephrine was deposited into the surgical site, which was then covered.  Following  appropriate curing of cement, the tourniquet was deflated.  Any minimal blood loss was cauterized.  Final testing of the knee, she had full extension, full flexion, good stability with varus and valgus stressing at 0 and 30 degrees, negative anterior  drawer and selected the 5, flexed the knee, removed the trial, inspected the joint and meticulously removed all redundant cement.  We then copiously irrigated with pulsatile lavage and  antiseptic solution, irrigated once again.  I placed the 5 permanent  insert and reduced it and had full extension and full flexion and good stability with varus and valgus stressing at 0 and 30 degrees.  Negative anterior drawer and excellent patellofemoral tracking.  Following this, the knee was partially flexed and I  reapproximated the patellar arthrotomy with #1 Vicryl in interrupted figure-of-eight sutures.  Oversewn with [name] Stratafix.  I had excellent patellofemoral tracking following this and flexion to gravity at 90 degrees. Subcutaneous with 2-0, irrigation  throughout.  Skin with Prolene.  Sterile dressing applied.  Placed in immobilizer and transported to the recovery room in satisfactory condition.  The patient tolerated the procedure well.  No complications.    Tourniquet time was 80 minutes.  Blood loss was 250 mL.     PAA D: 05/15/2021 10:11:11 am T: 05/15/2021 11:05:00 am  JOB: [id number]/ [id number]

## 2021-05-15 NOTE — Brief Op Note (Signed)
05/15/2021  9:59 AM  PATIENT:  Tanya [name]  65 y.o. female  PRE-OPERATIVE DIAGNOSIS:  Right knee degenerative joint disease  POST-OPERATIVE DIAGNOSIS:  Right knee degenerative joint disease  PROCEDURE:  Procedure(s) with comments: TOTAL KNEE ARTHROPLASTY (Right) - 2.5 hrs  SURGEON:  Surgeon(s) and Role:    Susa Day, MD - Primary  PHYSICIAN ASSISTANT:   ASSISTANTS: Bissell   ANESTHESIA:   spinal  EBL:  250 mL   BLOOD ADMINISTERED:none  DRAINS: none   LOCAL MEDICATIONS USED:  MARCAINE     SPECIMEN:  No Specimen  DISPOSITION OF SPECIMEN:  N/A  COUNTS:  YES  TOURNIQUET:   Total Tourniquet Time Documented: Thigh (Right) - 80 minutes Total: Thigh (Right) - 80 minutes   DICTATION: .Other Dictation: Dictation Number   [id number]  PLAN OF CARE: Admit for overnight observation  PATIENT DISPOSITION:  PACU - hemodynamically stable.   Delay start of Pharmacological VTE agent (>24hrs) due to surgical blood loss or risk of bleeding: no

## 2021-05-15 NOTE — Anesthesia Postprocedure Evaluation (Signed)
Anesthesia Post Note  Patient: Tanya [name]  Procedure(s) Performed: TOTAL KNEE ARTHROPLASTY (Right: Knee)     Patient location during evaluation: Nursing Unit Anesthesia Type: Spinal Level of consciousness: oriented and awake and alert Pain management: pain level controlled Vital Signs Assessment: post-procedure vital signs reviewed and stable Respiratory status: spontaneous breathing and respiratory function stable Cardiovascular status: blood pressure returned to baseline and stable Postop Assessment: no headache, no backache, no apparent nausea or vomiting and patient able to bend at knees Anesthetic complications: no   No notable events documented.  Last Vitals:  Vitals:   05/15/21 1255 05/15/21 1308  BP:    Pulse: 77 74  Resp: 13 14  Temp:    SpO2: 95% 94%    Last Pain:  Vitals:   05/15/21 1243  TempSrc:   PainSc: 0-No pain                 Barnet Glasgow

## 2021-05-16 ENCOUNTER — Encounter (HOSPITAL_COMMUNITY): Payer: Self-pay | Admitting: Specialist

## 2021-05-16 DIAGNOSIS — J4520 Mild intermittent asthma, uncomplicated: Secondary | ICD-10-CM | POA: Diagnosis not present

## 2021-05-16 DIAGNOSIS — M21161 Varus deformity, not elsewhere classified, right knee: Secondary | ICD-10-CM | POA: Diagnosis not present

## 2021-05-16 DIAGNOSIS — Z87891 Personal history of nicotine dependence: Secondary | ICD-10-CM | POA: Diagnosis not present

## 2021-05-16 DIAGNOSIS — I10 Essential (primary) hypertension: Secondary | ICD-10-CM | POA: Diagnosis not present

## 2021-05-16 DIAGNOSIS — Z96643 Presence of artificial hip joint, bilateral: Secondary | ICD-10-CM | POA: Diagnosis not present

## 2021-05-16 DIAGNOSIS — Z7984 Long term (current) use of oral hypoglycemic drugs: Secondary | ICD-10-CM | POA: Diagnosis not present

## 2021-05-16 DIAGNOSIS — Z79899 Other long term (current) drug therapy: Secondary | ICD-10-CM | POA: Diagnosis not present

## 2021-05-16 DIAGNOSIS — Z96651 Presence of right artificial knee joint: Secondary | ICD-10-CM

## 2021-05-16 DIAGNOSIS — M1711 Unilateral primary osteoarthritis, right knee: Secondary | ICD-10-CM | POA: Diagnosis not present

## 2021-05-16 DIAGNOSIS — E119 Type 2 diabetes mellitus without complications: Secondary | ICD-10-CM | POA: Diagnosis not present

## 2021-05-16 DIAGNOSIS — Z85828 Personal history of other malignant neoplasm of skin: Secondary | ICD-10-CM | POA: Diagnosis not present

## 2021-05-16 LAB — BASIC METABOLIC PANEL
Anion gap: 7 (ref 5–15)
BUN: 13 mg/dL (ref 8–23)
CO2: 24 mmol/L (ref 22–32)
Calcium: 8.7 mg/dL — ABNORMAL LOW (ref 8.9–10.3)
Chloride: 106 mmol/L (ref 98–111)
Creatinine, Ser: 0.62 mg/dL (ref 0.44–1.00)
GFR, Estimated: >60 mL/min (ref >60)
Glucose, Bld: 173 mg/dL — ABNORMAL HIGH (ref 70–99)
Potassium: 4.7 mmol/L (ref 3.5–5.1)
Sodium: 137 mmol/L (ref 135–145)

## 2021-05-16 LAB — CBC
HCT: 39.7 % (ref 36.0–46.0)
Hemoglobin: 13 g/dL (ref 12.0–15.0)
MCH: 30.2 pg (ref 26.0–34.0)
MCHC: 32.7 g/dL (ref 30.0–36.0)
MCV: 92.3 fL (ref 80.0–100.0)
Platelets: 208 10*3/uL (ref 150–400)
RBC: 4.3 MIL/uL (ref 3.87–5.11)
RDW: 13.2 % (ref 11.5–15.5)
WBC: 13.9 10*3/uL — ABNORMAL HIGH (ref 4.0–10.5)
nRBC: 0 % (ref 0.0–0.2)

## 2021-05-16 LAB — GLUCOSE, CAPILLARY
Glucose-Capillary: 149 mg/dL — ABNORMAL HIGH (ref 70–99)
Glucose-Capillary: 129 mg/dL — ABNORMAL HIGH (ref 70–99)
Glucose-Capillary: 117 mg/dL — ABNORMAL HIGH (ref 70–99)
Glucose-Capillary: 157 mg/dL — ABNORMAL HIGH (ref 70–99)

## 2021-05-16 NOTE — Progress Notes (Signed)
Physical Therapy Treatment Patient Details Name: Tanya [name] MRN: [id number] DOB: [birth year] Today's Date: 05/16/2021    History of Present Illness Patient is 64 y.o. female s/p Rt TKA on 05/15/21 with PMH significant forDM, HTN, OA, Rt THA in 2017, Lt TKA in 2015.    PT Comments    Pt with reduced pain compared to POD0 and able to progress ambulation to ~80' with min assist/guard and no buckling noted on Rt knee. Patient demonstrated safe walker management with min cues. HEP initiated with exercises for ROM, strength, and circulation. Patient will benefit from follow up session to progress gait and initiate stair training.     Follow Up Recommendations  Follow surgeon's recommendation for DC plan and follow-up therapies;Home health PT     Equipment Recommendations       Recommendations for Other Services       Precautions / Restrictions Precautions Precautions: Fall Restrictions Weight Bearing Restrictions: No RLE Weight Bearing: Weight bearing as tolerated Other Position/Activity Restrictions: WBAT    Mobility  Bed Mobility Overal bed mobility: Needs Assistance Bed Mobility: Supine to Sit     Supine to sit: HOB elevated;Min guard     General bed mobility comments: cues to use belt to bring Rt LE off EOB, pt using bed rail to pivot and scoot forward to EOB.    Transfers Overall transfer level: Needs assistance Equipment used: Rolling walker (2 wheeled) Transfers: Sit to/from Stand Sit to Stand: From elevated surface;Min assist         General transfer comment: cues for hand placement to rise from EOB, min assist to initiate power up and pt able to complete rise with guarding for safety.  Ambulation/Gait Ambulation/Gait assistance: Min assist;Min guard Gait Distance (Feet): 80 Feet Assistive device: Rolling walker (2 wheeled) Gait Pattern/deviations: Step-to pattern;Decreased stride length;Decreased weight shift to right;Antalgic Gait velocity: decr    General Gait Details: cues for step pattern and proximity to RW, pt maintained throughout with no overt LOB or buckling at Rt knee. pt progressed from assist to min guard for safety   Stairs             Wheelchair Mobility    Modified Rankin (Stroke Patients Only)       Balance Overall balance assessment: Needs assistance Sitting-balance support: Feet supported Sitting balance-Leahy Scale: Good     Standing balance support: During functional activity;Bilateral upper extremity supported Standing balance-Leahy Scale: Poor                              Cognition Arousal/Alertness: Awake/alert Behavior During Therapy: WFL for tasks assessed/performed Overall Cognitive Status: Within Functional Limits for tasks assessed                                        Exercises Total Joint Exercises Ankle Circles/Pumps: AROM;Both;15 reps;Seated Quad Sets: AROM;Right;10 reps;Seated Heel Slides: Right;10 reps;Seated;AAROM Hip ABduction/ADduction: AROM;Right;10 reps;Seated    General Comments        Pertinent Vitals/Pain Pain Assessment: 0-10 Pain Score: 5  Pain Location: Rt knee/thigh Pain Descriptors / Indicators: Burning;Aching;Discomfort Pain Intervention(s): Limited activity within patient's tolerance;Monitored during session;Repositioned;Premedicated before session;Ice applied    Home Living                      Prior Function  PT Goals (current goals can now be found in the care plan section) Acute Rehab PT Goals Patient Stated Goal: stop hurting PT Goal Formulation: With patient Time For Goal Achievement: 05/22/21 Potential to Achieve Goals: Good Progress towards PT goals: Progressing toward goals    Frequency    7X/week      PT Plan Current plan remains appropriate    Co-evaluation              AM-PAC PT "6 Clicks" Mobility   Outcome Measure  Help needed turning from your back to your side  while in a flat bed without using bedrails?: A Little Help needed moving from lying on your back to sitting on the side of a flat bed without using bedrails?: A Little Help needed moving to and from a bed to a chair (including a wheelchair)?: A Lot Help needed standing up from a chair using your arms (e.g., wheelchair or bedside chair)?: A Lot Help needed to walk in hospital room?: A Little Help needed climbing 3-5 steps with a railing? : A Lot 6 Click Score: 15    End of Session Equipment Utilized During Treatment: Gait belt Activity Tolerance: Patient limited by pain Patient left: in chair;with call bell/phone within reach;with chair alarm set Nurse Communication: Mobility status;Patient requests pain meds PT Visit Diagnosis: Muscle weakness (generalized) (M62.81);Difficulty in walking, not elsewhere classified (R26.2)       05/16/21 0900  PT Time Calculation  PT Start Time (ACUTE ONLY) 8891  PT Stop Time (ACUTE ONLY) 1012  PT Time Calculation (min) (ACUTE ONLY) 35 min  PT General Charges  $$ ACUTE PT VISIT 1 Visit  PT Treatments  $Gait Training 8-22 mins  $Therapeutic Exercise 8-22 mins                      Verner Mould, DPT Acute Rehabilitation Services Office [phone] Pager [phone]    Jacques Navy 05/16/2021, 5:08 PM

## 2021-05-16 NOTE — Progress Notes (Signed)
Physical Therapy Treatment Patient Details Name: Tanya [name] MRN: [id number] DOB: 19-Nov-1956 Today's Date: 05/16/2021    History of Present Illness Patient is 65 y.o. female s/p Rt TKA on 05/15/21 with PMH significant forDM, HTN, OA, Rt THA in 2017, Lt TKA in 2015.    PT Comments    Patient with slightly increased pain compared to AM session. Initiated stair mobility this date and pt anxious given history of falling on stairs. She required min guard for gait with no signs of Rt knee buckling but Mod assist with manual blocking/facilitation of Rt knee extension for stair mobility. Plan to use knee immobilizer for steps tomorrow for added stability. She will benefit from additional acute therapy services to progress mobility for safe discharge home.    Follow Up Recommendations  Follow surgeon's recommendation for DC plan and follow-up therapies;Home health PT     Equipment Recommendations       Recommendations for Other Services       Precautions / Restrictions Precautions Precautions: Fall Restrictions Weight Bearing Restrictions: No RLE Weight Bearing: Weight bearing as tolerated Other Position/Activity Restrictions: WBAT    Mobility  Bed Mobility Overal bed mobility: Needs Assistance Bed Mobility: Sit to Supine       Sit to supine: Min guard;HOB elevated   General bed mobility comments: cues for use of belt to raise Rt LE back onto bed, pt taking extra time to scoot back and reposition in bed.    Transfers Overall transfer level: Needs assistance Equipment used: Rolling walker (2 wheeled) Transfers: Sit to/from Stand Sit to Stand: From elevated surface;Min assist         General transfer comment: pt with good carryover for hand placement/technique. light assist to steady with power up/hand transition from chair to walker.  Ambulation/Gait Ambulation/Gait assistance: Min assist;Min guard Gait Distance (Feet): 50 Feet Assistive device: Rolling walker (2  wheeled) Gait Pattern/deviations: Step-to pattern;Decreased stride length;Decreased weight shift to right;Antalgic Gait velocity: decr   General Gait Details: good carryover for safe step pattern with RW, no buckling noted at Rt knee. min guard for safety, slight assist after stair training due to fatigue.   Stairs Stairs: Yes Stairs assistance: Mod assist Stair Management: One rail Right;Step to pattern;Sideways Number of Stairs: 2 (2x1) General stair comments: Cues for side step technique and manual facilitatio/blcking at Rt knee to maintain extension in stance with Lt LE step up. pt anxious about steps 2/2 history of buckling. pt with heavy reliance on railing for bil UE support. plan to use immobilizer tomorrow.   Wheelchair Mobility    Modified Rankin (Stroke Patients Only)       Balance Overall balance assessment: Needs assistance Sitting-balance support: Feet supported Sitting balance-Leahy Scale: Good     Standing balance support: During functional activity;Bilateral upper extremity supported Standing balance-Leahy Scale: Poor                              Cognition Arousal/Alertness: Awake/alert Behavior During Therapy: WFL for tasks assessed/performed;Anxious Overall Cognitive Status: Within Functional Limits for tasks assessed                                 General Comments: pt anxious about stair mobility      Exercises Total Joint Exercises Ankle Circles/Pumps: AROM;Both;15 reps;Seated    General Comments        Pertinent Vitals/Pain  Pain Assessment: 0-10 Pain Score: 7  Pain Location: Rt knee/thigh Pain Descriptors / Indicators: Burning;Aching;Discomfort Pain Intervention(s): Limited activity within patient's tolerance;Monitored during session;Repositioned;Premedicated before session;Ice applied    Home Living                      Prior Function            PT Goals (current goals can now be found in the  care plan section) Acute Rehab PT Goals Patient Stated Goal: stop hurting PT Goal Formulation: With patient Time For Goal Achievement: 05/22/21 Potential to Achieve Goals: Good Progress towards PT goals: Progressing toward goals    Frequency    7X/week      PT Plan Current plan remains appropriate    Co-evaluation              AM-PAC PT "6 Clicks" Mobility   Outcome Measure  Help needed turning from your back to your side while in a flat bed without using bedrails?: A Little Help needed moving from lying on your back to sitting on the side of a flat bed without using bedrails?: A Little Help needed moving to and from a bed to a chair (including a wheelchair)?: A Little Help needed standing up from a chair using your arms (e.g., wheelchair or bedside chair)?: A Little Help needed to walk in hospital room?: A Little Help needed climbing 3-5 steps with a railing? : A Lot 6 Click Score: 17    End of Session Equipment Utilized During Treatment: Gait belt Activity Tolerance: Patient limited by pain Patient left: in chair;with call bell/phone within reach;with chair alarm set Nurse Communication: Mobility status;Patient requests pain meds PT Visit Diagnosis: Muscle weakness (generalized) (M62.81);Difficulty in walking, not elsewhere classified (R26.2)     Time: 1449-1510 PT Time Calculation (min) (ACUTE ONLY): 21 min  Charges:  $Gait Training: 8-22 mins                     Tanya [name], DPT Acute Rehabilitation Services Office [phone] Pager [phone]    Tanya [name] 05/16/2021, 5:17 PM

## 2021-05-16 NOTE — TOC Transition Note (Signed)
Transition of Care [facility]) - CM/SW Discharge Note   Patient Details  Name: Tanya [name] MRN: [id number] Date of Birth: 12-24-1955  [facility] CM/SW Contact:  Lennart Pall, LCSW Phone Number: 05/16/2021, 3:02 PM   Clinical Narrative:    Met with pt to review dc needs. Pt confirms she has all needed DME at home.  Orders for HHPT follow up and pt without agency preference.  Referral placed with [facility].  No further TOC needs.   Final next level of care: [place] Barriers to Discharge: No Barriers Identified   Patient Goals and CMS Choice Patient states their goals for this hospitalization and ongoing recovery are:: return home      Discharge Placement                       Discharge Plan and Services                DME Arranged: N/A DME Agency: NA       HH Arranged: PT HH Agency: [place] Date [place]: 05/16/21 Time [place]: 1502 Representative spoke with at [place]: [place] Interventions     Readmission Risk Interventions No flowsheet data found.

## 2021-05-16 NOTE — Progress Notes (Signed)
Patient ID: Tanya [name], female   DOB: 06/29/1956, 65 y.o.   MRN: [id number] Subjective: 1 Day Post-Op Procedure(s) (LRB): TOTAL KNEE ARTHROPLASTY (Right) Patient reports pain as moderate and severe.    Patient has complaints of knee pain. Was fine until got up to go to the bathroom a little while ago and now having pain. Reports in knee and thigh. No numbness or tingling. No CP, SOB, fever, chills, N/V.  We will start therapy today. Plan is to go home after hospital stay.  Objective: Vital signs in last 24 hours: Temp:  [97.3 F (36.3 C)-97.8 F (36.6 C)] 97.3 F (36.3 C) (06/23 0209) Pulse Rate:  [63-78] 63 (06/23 0209) Resp:  [11-27] 16 (06/23 0209) BP: (87-138)/(53-90) 138/90 (06/23 0209) SpO2:  [93 %-100 %] 96 % (06/23 0209)  Intake/Output from previous day:  Intake/Output Summary (Last 24 hours) at 05/16/2021 0810 Last data filed at 05/16/2021 0600 Gross per 24 hour  Intake 4441 ml  Output 3700 ml  Net 741 ml    Intake/Output this shift: No intake/output data recorded.  Labs: Results for orders placed or performed during the hospital encounter of 05/15/21  CBC with Differential/Platelet  Result Value Ref Range   WBC 6.0 4.0 - 10.5 K/uL   RBC 4.65 3.87 - 5.11 MIL/uL   Hemoglobin 14.0 12.0 - 15.0 g/dL   HCT 42.4 36.0 - 46.0 %   MCV 91.2 80.0 - 100.0 fL   MCH 30.1 26.0 - 34.0 pg   MCHC 33.0 30.0 - 36.0 g/dL   RDW 13.4 11.5 - 15.5 %   Platelets 196 150 - 400 K/uL   nRBC 0.0 0.0 - 0.2 %   Neutrophils Relative % 51 %   Neutro Abs 3.1 1.7 - 7.7 K/uL   Lymphocytes Relative 38 %   Lymphs Abs 2.3 0.7 - 4.0 K/uL   Monocytes Relative 8 %   Monocytes Absolute 0.5 0.1 - 1.0 K/uL   Eosinophils Relative 2 %   Eosinophils Absolute 0.1 0.0 - 0.5 K/uL   Basophils Relative 1 %   Basophils Absolute 0.0 0.0 - 0.1 K/uL   Immature Granulocytes 0 %   Abs Immature Granulocytes 0.02 0.00 - 0.07 K/uL  Basic metabolic panel  Result Value Ref Range   Sodium 138 135 - 145 mmol/L    Potassium 3.9 3.5 - 5.1 mmol/L   Chloride 107 98 - 111 mmol/L   CO2 24 22 - 32 mmol/L   Glucose, Bld 131 (H) 70 - 99 mg/dL   BUN 19 8 - 23 mg/dL   Creatinine, Ser 0.82 0.44 - 1.00 mg/dL   Calcium 9.2 8.9 - 10.3 mg/dL   GFR, Estimated >60 >60 mL/min   Anion gap 7 5 - 15  Glucose, capillary  Result Value Ref Range   Glucose-Capillary 143 (H) 70 - 99 mg/dL  Glucose, capillary  Result Value Ref Range   Glucose-Capillary 127 (H) 70 - 99 mg/dL  Hemoglobin A1c  Result Value Ref Range   Hgb A1c MFr Bld 6.3 (H) 4.8 - 5.6 %   Mean Plasma Glucose 134.11 mg/dL  Glucose, capillary  Result Value Ref Range   Glucose-Capillary 225 (H) 70 - 99 mg/dL  CBC  Result Value Ref Range   WBC 13.9 (H) 4.0 - 10.5 K/uL   RBC 4.30 3.87 - 5.11 MIL/uL   Hemoglobin 13.0 12.0 - 15.0 g/dL   HCT 39.7 36.0 - 46.0 %   MCV 92.3 80.0 - 100.0 fL  MCH 30.2 26.0 - 34.0 pg   MCHC 32.7 30.0 - 36.0 g/dL   RDW 13.2 11.5 - 15.5 %   Platelets 208 150 - 400 K/uL   nRBC 0.0 0.0 - 0.2 %  Basic metabolic panel  Result Value Ref Range   Sodium 137 135 - 145 mmol/L   Potassium 4.7 3.5 - 5.1 mmol/L   Chloride 106 98 - 111 mmol/L   CO2 24 22 - 32 mmol/L   Glucose, Bld 173 (H) 70 - 99 mg/dL   BUN 13 8 - 23 mg/dL   Creatinine, Ser 0.62 0.44 - 1.00 mg/dL   Calcium 8.7 (L) 8.9 - 10.3 mg/dL   GFR, Estimated >60 >60 mL/min   Anion gap 7 5 - 15  Glucose, capillary  Result Value Ref Range   Glucose-Capillary 199 (H) 70 - 99 mg/dL  Glucose, capillary  Result Value Ref Range   Glucose-Capillary 149 (H) 70 - 99 mg/dL    Exam - Neurologically intact ABD soft Neurovascular intact Sensation intact distally Intact pulses distally Dorsiflexion/Plantar flexion intact Incision: dressing C/D/I and no drainage No cellulitis present Compartment soft No sign of DVT Dressing - clean, dry, no drainage Motor function intact - moving foot and toes well on exam.   Assessment/Plan: 1 Day Post-Op Procedure(s) (LRB): TOTAL KNEE  ARTHROPLASTY (Right)  Advance diet Up with therapy D/C IV fluids Past Medical History:  Diagnosis Date   Allergic rhinitis    Blepharospasm 10/09/2010   Cancer ([place])    Skin   Diabetes mellitus without complication (HCC)    Hip pain    History of melanoma excision    MULTIPLE SITES   Hypertension    Macular pucker 04/2021   Left   Mild intermittent asthma    OA (osteoarthritis)    PMB (postmenopausal bleeding)    PONV (postoperative nausea and vomiting)    did not experience after D&C, request to use same medicaitons from that procedure   Precancerous changes of the cervix    Sleep apnea    tested in home around 2 yrs ago   Varicose vein    wears compression stocking    DVT Prophylaxis - ASA Protocol Weight-Bearing as tolerated to right leg No vaccines. D/C when progressing well with PT and ready for home, possibly later today vs. Tomorrow depending on progress and pain control Will discuss with Dr Valentino Saxon Deatra Robinson 05/16/2021, 8:10 AM

## 2021-05-17 DIAGNOSIS — M21161 Varus deformity, not elsewhere classified, right knee: Secondary | ICD-10-CM | POA: Diagnosis not present

## 2021-05-17 DIAGNOSIS — Z79899 Other long term (current) drug therapy: Secondary | ICD-10-CM | POA: Diagnosis not present

## 2021-05-17 DIAGNOSIS — M1711 Unilateral primary osteoarthritis, right knee: Secondary | ICD-10-CM | POA: Diagnosis not present

## 2021-05-17 DIAGNOSIS — J4520 Mild intermittent asthma, uncomplicated: Secondary | ICD-10-CM | POA: Diagnosis not present

## 2021-05-17 DIAGNOSIS — E119 Type 2 diabetes mellitus without complications: Secondary | ICD-10-CM | POA: Diagnosis not present

## 2021-05-17 DIAGNOSIS — Z87891 Personal history of nicotine dependence: Secondary | ICD-10-CM | POA: Diagnosis not present

## 2021-05-17 DIAGNOSIS — Z85828 Personal history of other malignant neoplasm of skin: Secondary | ICD-10-CM | POA: Diagnosis not present

## 2021-05-17 DIAGNOSIS — Z96643 Presence of artificial hip joint, bilateral: Secondary | ICD-10-CM | POA: Diagnosis not present

## 2021-05-17 DIAGNOSIS — Z7984 Long term (current) use of oral hypoglycemic drugs: Secondary | ICD-10-CM | POA: Diagnosis not present

## 2021-05-17 DIAGNOSIS — I10 Essential (primary) hypertension: Secondary | ICD-10-CM | POA: Diagnosis not present

## 2021-05-17 LAB — CBC
HCT: 36.5 % (ref 36.0–46.0)
Hemoglobin: 11.9 g/dL — ABNORMAL LOW (ref 12.0–15.0)
MCH: 29.8 pg (ref 26.0–34.0)
MCHC: 32.6 g/dL (ref 30.0–36.0)
MCV: 91.5 fL (ref 80.0–100.0)
Platelets: 197 10*3/uL (ref 150–400)
RBC: 3.99 MIL/uL (ref 3.87–5.11)
RDW: 13.6 % (ref 11.5–15.5)
WBC: 11.5 10*3/uL — ABNORMAL HIGH (ref 4.0–10.5)
nRBC: 0 % (ref 0.0–0.2)

## 2021-05-17 LAB — GLUCOSE, CAPILLARY
Glucose-Capillary: 130 mg/dL — ABNORMAL HIGH (ref 70–99)
Glucose-Capillary: 132 mg/dL — ABNORMAL HIGH (ref 70–99)
Glucose-Capillary: 155 mg/dL — ABNORMAL HIGH (ref 70–99)

## 2021-05-17 MED ORDER — METHOCARBAMOL 500 MG PO TABS
500.0000 mg | ORAL_TABLET | Freq: Four times a day (QID) | ORAL | Status: DC | PRN
  Administered 2021-05-17: 500 mg via ORAL
  Filled 2021-05-17: qty 1

## 2021-05-17 MED ORDER — HYDROMORPHONE HCL 2 MG PO TABS: 2.0000 mg | ORAL_TABLET | ORAL | 0 refills | Status: DC | PRN

## 2021-05-17 MED ORDER — HYDROMORPHONE HCL 2 MG PO TABS
2.0000 mg | ORAL_TABLET | ORAL | Status: DC | PRN
Start: 2021-05-17 — End: 2021-05-17
  Administered 2021-05-17 (×3): 4 mg via ORAL
  Filled 2021-05-17 (×3): qty 2

## 2021-05-17 MED ORDER — GABAPENTIN 300 MG PO CAPS
300.0000 mg | ORAL_CAPSULE | Freq: Three times a day (TID) | ORAL | Status: DC
  Administered 2021-05-17 (×2): 300 mg via ORAL
  Filled 2021-05-17 (×2): qty 1

## 2021-05-17 MED ORDER — GABAPENTIN 300 MG PO CAPS: 300.0000 mg | ORAL_CAPSULE | Freq: Three times a day (TID) | ORAL | 1 refills | Status: DC

## 2021-05-17 NOTE — Progress Notes (Signed)
Patient discharged to home w/ family. Given all belongings, instructions. Verbalized understanding of instructions. Escorted to pov via w/c. 

## 2021-05-17 NOTE — Progress Notes (Signed)
Subjective: 2 Days Post-Op Procedure(s) (LRB): TOTAL KNEE ARTHROPLASTY (Right) Patient reports pain as severe.   Reports severe pain around 3/330 AM (likely as block wore off). Unable to even put weight on her leg today. No numbness, tingling, or burning. Pain is mostly in knee and thigh. No LBP. No CP, SOB, N/V.  Objective: Vital signs in last 24 hours: Temp:  [98.1 F (36.7 C)-98.3 F (36.8 C)] 98.1 F (36.7 C) (06/24 0518) Pulse Rate:  [72-87] 87 (06/24 0518) Resp:  [18] 18 (06/24 0518) BP: (129-140)/(72-80) 140/80 (06/24 0518) SpO2:  [98 %-99 %] 99 % (06/24 0518)  Intake/Output from previous day: 06/23 0701 - 06/24 0700 In: 360 [P.O.:360] Out: -  Intake/Output this shift: No intake/output data recorded.  Recent Labs    05/15/21 0620 05/16/21 0310 05/17/21 0307  HGB 14.0 13.0 11.9*   Recent Labs    05/16/21 0310 05/17/21 0307  WBC 13.9* 11.5*  RBC 4.30 3.99  HCT 39.7 36.5  PLT 208 197   Recent Labs    05/15/21 0620 05/16/21 0310  NA 138 137  K 3.9 4.7  CL 107 106  CO2 24 24  BUN 19 13  CREATININE 0.82 0.62  GLUCOSE 131* 173*  CALCIUM 9.2 8.7*   No results for input(s): LABPT, INR in the last 72 hours.  Neurologically intact ABD soft Neurovascular intact Sensation intact distally Intact pulses distally Dorsiflexion/Plantar flexion intact Incision: dressing C/D/I and no drainage No cellulitis present Compartment soft No calf pain or sign of DVT   Assessment/Plan: 2 Days Post-Op Procedure(s) (LRB): TOTAL KNEE ARTHROPLASTY (Right) Advance diet Up with therapy D/C IV fluids Switch PO Oxy to PO dilaudid for attempted better pain control Add gabapentin 300mg  TID for tourniquet/nerve pain Add muscle relaxer Will consider celebrex or toradol if not improving with these Will need better pain control and progress with PT prior to going home Discussed with Dr. Valentino Saxon Deatra Robinson 05/17/2021, 8:12 AM

## 2021-05-17 NOTE — Progress Notes (Signed)
Patient ID: Tanya [name], female   DOB: 1956/03/06, 65 y.o.   MRN: [id number]  Pt doing much better this afternoon following change in meds. Feels ready to go home. Did well with PT, 3 steps up and down. No other c/o.  Will D/C today, new meds sent to pharmacy. May resume home celebrex which will also help with pain and swelling  Seen by myself and Dr Tonita Cong

## 2021-05-17 NOTE — Progress Notes (Signed)
Physical Therapy Treatment Patient Details Name: Tanya [name] MRN: [id number] DOB: 31-Mar-1956 Today's Date: 05/17/2021    History of Present Illness Patient is 65 y.o. female s/p Rt TKA on 05/15/21 with PMH significant forDM, HTN, OA, Rt THA in 2017, Lt TKA in 2015.    PT Comments    Patient made excellent progress with PM session and demonstrated improved independence with power up from EOB to stand and safe use of RW for gait. Pt with more confidence completing stair mobility and able to complete 4 steps with side step pattern and min guard for safety. Reviewed HEP and use of immobilizer for stairs. Patient was issued basic knee immobilizer (KI) for safe DC home for increased safety with stair mobility. Patient was instructed on donning/doffing of KI and instructed on short term use for ambulation/stair mobility to prevent buckling with stair negotiation. Patient educated to remove KI in sitting/supine after safely negotiating stairs into home/second floor. Educated that [place] should not be worn to bed and does not need to be used again for mobility.  She is at safe level for discharge home with assist from family. Acute PT will progress during stay.    Follow Up Recommendations  Follow surgeon's recommendation for DC plan and follow-up therapies;Home health PT     Equipment Recommendations       Recommendations for Other Services       Precautions / Restrictions Precautions Precautions: Fall Restrictions Weight Bearing Restrictions: No RLE Weight Bearing: Weight bearing as tolerated Other Position/Activity Restrictions: WBAT    Mobility  Bed Mobility Overal bed mobility: Needs Assistance Bed Mobility: Sit to Supine     Supine to sit: Min guard;HOB elevated Sit to supine: Min guard;HOB elevated   General bed mobility comments: pt taking extra time and using belt for Rt LE mobility. gurading for safety.    Transfers Overall transfer level: Needs assistance Equipment  used: Rolling walker (2 wheeled) Transfers: Sit to/from Stand Sit to Stand: Min guard         General transfer comment: pt with improved strength and initiated power up to rise from EOB. guarding for safety.  Ambulation/Gait Ambulation/Gait assistance: Min guard   Assistive device: Rolling walker (2 wheeled) Gait Pattern/deviations: Step-to pattern;Decreased stride length;Decreased weight shift to right;Antalgic Gait velocity: decr   General Gait Details: good carryover for safe step pattern with RW, no buckling noted at Rt knee. min guard for safety.   Stairs   Stairs assistance: Min guard Stair Management: One rail Right;Step to pattern;Sideways Number of Stairs: 4 General stair comments: pt with good carryover from AM session, knee immobilizer in place for safety and pt with good use of bil UE's for support. side step pattern "up with good down with bad" and pt verbalized safe guarding for husband to provide.   Wheelchair Mobility    Modified Rankin (Stroke Patients Only)       Balance Overall balance assessment: Needs assistance Sitting-balance support: Feet supported Sitting balance-Leahy Scale: Good     Standing balance support: During functional activity;Bilateral upper extremity supported Standing balance-Leahy Scale: Poor                              Cognition Arousal/Alertness: Awake/alert Behavior During Therapy: WFL for tasks assessed/performed;Anxious Overall Cognitive Status: Within Functional Limits for tasks assessed  Exercises Total Joint Exercises Ankle Circles/Pumps: AROM;Both;15 reps;Seated Quad Sets: AROM;Right;5 reps;Supine Short Arc Quad: AROM;Right;5 reps;Supine Heel Slides: AROM;Right;5 reps;Supine Hip ABduction/ADduction: AROM;Right;5 reps;Supine    General Comments        Pertinent Vitals/Pain Pain Assessment: 0-10 Pain Score: 3  Pain Location: Rt  knee/thigh Pain Descriptors / Indicators: Burning;Aching;Discomfort Pain Intervention(s): Limited activity within patient's tolerance;Monitored during session;Repositioned;Premedicated before session;Ice applied    Home Living                      Prior Function            PT Goals (current goals can now be found in the care plan section) Acute Rehab PT Goals Patient Stated Goal: stop hurting PT Goal Formulation: With patient Time For Goal Achievement: 05/22/21 Potential to Achieve Goals: Good    Frequency    7X/week      PT Plan Current plan remains appropriate    Co-evaluation              AM-PAC PT "6 Clicks" Mobility   Outcome Measure  Help needed turning from your back to your side while in a flat bed without using bedrails?: A Little Help needed moving from lying on your back to sitting on the side of a flat bed without using bedrails?: A Little Help needed moving to and from a bed to a chair (including a wheelchair)?: A Little Help needed standing up from a chair using your arms (e.g., wheelchair or bedside chair)?: A Little Help needed to walk in hospital room?: A Little Help needed climbing 3-5 steps with a railing? : A Lot 6 Click Score: 17    End of Session Equipment Utilized During Treatment: Gait belt Activity Tolerance: Patient limited by pain Patient left: in chair;with call bell/phone within reach;with chair alarm set Nurse Communication: Mobility status;Patient requests pain meds PT Visit Diagnosis: Muscle weakness (generalized) (M62.81);Difficulty in walking, not elsewhere classified (R26.2)     Time: 0272-5366 PT Time Calculation (min) (ACUTE ONLY): 23 min  Charges:  $Gait Training: 8-22 mins $Therapeutic Exercise: 8-22 mins                     Verner Mould, DPT Acute Rehabilitation Services Office [phone] Pager [phone]    Jacques Navy 05/17/2021, 6:45 PM

## 2021-05-17 NOTE — Progress Notes (Signed)
Physical Therapy Treatment Patient Details Name: Tanya [name] MRN: [id number] DOB: [birth year] Today's Date: 05/17/2021    History of Present Illness Patient is 65 y.o. female s/p Rt TKA on 05/15/21 with PMH significant forDM, HTN, OA, Rt THA in 2017, Lt TKA in 2015.    PT Comments    Patient with significant pain overnight but reports improved pain with medication adjustment. Patient educated on use of knee immobilizer for gait and stair mobility and pt able to ambulate in hallway and room to use bathroom and then complete stair training. She required min assist for stairs and cues for safe sequencing. Pt reports more comfortable with use of immobilizer. Will follow up for PM session to progress mobility in preperation for safe discharge home.            05/17/21 1100  PT Visit Information  Last PT Received On 05/17/21  Assistance Needed +1  History of Present Illness Patient is 65 y.o. female s/p Rt TKA on 05/15/21 with PMH significant forDM, HTN, OA, Rt THA in 2017, Lt TKA in 2015.  Subjective Data  Patient Stated Goal stop hurting  Precautions  Precautions Fall  Restrictions  Weight Bearing Restrictions No  RLE Weight Bearing WBAT  Other Position/Activity Restrictions WBAT  Pain Assessment  Pain Assessment Faces  Faces Pain Scale 6  Pain Location Rt knee/thigh  Pain Descriptors / Indicators Burning;Aching;Discomfort  Pain Intervention(s) Limited activity within patient's tolerance;Monitored during session;Premedicated before session;Ice applied;Repositioned  Cognition  Arousal/Alertness Awake/alert  Behavior During Therapy WFL for tasks assessed/performed;Anxious  Overall Cognitive Status Within Functional Limits for tasks assessed  General Comments pt anxious about stair mobility  Bed Mobility  Overal bed mobility Needs Assistance  Bed Mobility Sit to Supine  Sit to supine Min guard;HOB elevated  General bed mobility comments cues for use of belt to raise Rt LE back onto  bed, pt taking extra time to scoot back and reposition in bed.  Transfers  Overall transfer level Needs assistance  Equipment used Rolling walker (2 wheeled)  Transfers Sit to/from Stand  Sit to Stand From elevated surface;Min assist  General transfer comment pt with good carryover for hand placement/technique. light assist to steady with power up/hand transition from chair to walker.  Ambulation/Gait  Ambulation/Gait assistance Min assist;Min guard  Assistive device Rolling walker (2 wheeled)  Gait Pattern/deviations Step-to pattern;Decreased stride length;Decreased weight shift to right;Antalgic  General Gait Details good carryover for safe step pattern with RW, no buckling noted at Rt knee. min guard for safety, slight assist after stair training due to fatigue.  Gait velocity decr  Stairs assistance Min assist  Stair Management One rail Right;Step to pattern;Sideways  General stair comments knee immobilizer in place, min assist to steady with side step tehcnique  Balance  Overall balance assessment Needs assistance  Sitting-balance support Feet supported  Sitting balance-Leahy Scale Good  Standing balance support During functional activity;Bilateral upper extremity supported  Standing balance-Leahy Scale Poor  Exercises  Exercises Total Joint  Total Joint Exercises  Ankle Circles/Pumps AROM;Both;15 reps;Seated  PT - End of Session  Equipment Utilized During Treatment Gait belt  Activity Tolerance Patient limited by pain  Patient left in chair;with call bell/phone within reach;with chair alarm set  Nurse Communication Mobility status;Patient requests pain meds   PT - Assessment/Plan  PT Plan Current plan remains appropriate  PT Visit Diagnosis Muscle weakness (generalized) (M62.81);Difficulty in walking, not elsewhere classified (R26.2)  PT Frequency (ACUTE ONLY) 7X/week  Follow Up Recommendations Follow  surgeon's recommendation for DC plan and follow-up therapies;Home health PT   AM-PAC PT "6 Clicks" Mobility Outcome Measure (Version 2)  Help needed turning from your back to your side while in a flat bed without using bedrails? 3  Help needed moving from lying on your back to sitting on the side of a flat bed without using bedrails? 3  Help needed moving to and from a bed to a chair (including a wheelchair)? 3  Help needed standing up from a chair using your arms (e.g., wheelchair or bedside chair)? 3  Help needed to walk in hospital room? 3  Help needed climbing 3-5 steps with a railing?  2  6 Click Score 17  Consider Recommendation of Discharge To: Home with [facility]  Acute Rehab PT Goals  PT Goal Formulation With patient  Time For Goal Achievement 05/22/21  Potential to Achieve Goals Good  PT Time Calculation  PT Start Time (ACUTE ONLY) 1021  PT Stop Time (ACUTE ONLY) 1050  PT Time Calculation (min) (ACUTE ONLY) 29 min  PT General Charges  $$ ACUTE PT VISIT 1 Visit  PT Treatments  $Gait Training 23-37 mins      Verner Mould, DPT Acute Rehabilitation Services Office [phone] Pager [phone]    Jacques Navy 05/17/2021, 6:48 PM

## 2021-05-29 NOTE — Discharge Summary (Signed)
Physician Discharge Summary   Patient ID: Tanya [name] MRN: [id number] DOB/AGE: 05/21/1956 65 y.o.  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   Weight as of this encounter: 111.6 kg.  Procedure:  Procedure(s) (LRB): TOTAL KNEE ARTHROPLASTY (Right)   Consults: None  HPI: see H&P Laboratory Data: Admission on 05/15/2021, Discharged on 05/17/2021  Component Date Value Ref Range Status   WBC 05/15/2021 6.0  4.0 - 10.5 K/uL Final   RBC 05/15/2021 4.65  3.87 - 5.11 MIL/uL Final   Hemoglobin 05/15/2021 14.0  12.0 - 15.0 g/dL Final   HCT 05/15/2021 42.4  36.0 - 46.0 % Final   MCV 05/15/2021 91.2  80.0 - 100.0 fL Final   MCH 05/15/2021 30.1  26.0 - 34.0 pg Final   MCHC 05/15/2021 33.0  30.0 - 36.0 g/dL Final   RDW 05/15/2021 13.4  11.5 - 15.5 % Final   Platelets 05/15/2021 196  150 - 400 K/uL Final   nRBC 05/15/2021 0.0  0.0 - 0.2 % Final   Neutrophils Relative % 05/15/2021 51  % Final   Neutro Abs 05/15/2021 3.1  1.7 - 7.7 K/uL Final   Lymphocytes  Relative 05/15/2021 38  % Final   Lymphs Abs 05/15/2021 2.3  0.7 - 4.0 K/uL Final   Monocytes Relative 05/15/2021 8  % Final   Monocytes Absolute 05/15/2021 0.5  0.1 - 1.0 K/uL Final   Eosinophils Relative 05/15/2021 2  % Final   Eosinophils Absolute 05/15/2021 0.1  0.0 - 0.5 K/uL Final   Basophils Relative 05/15/2021 1  % Final   Basophils Absolute 05/15/2021 0.0  0.0 - 0.1 K/uL Final   Immature Granulocytes 05/15/2021 0  % Final   Abs Immature Granulocytes 05/15/2021 0.02  0.00 - 0.07 K/uL Final   Performed at [facility], [place] [address] [place], [place] [address]   Sodium 05/15/2021 138  135 - 145 mmol/L Final   Potassium 05/15/2021 3.9  3.5 - 5.1 mmol/L Final   Chloride 05/15/2021 107  98 - 111 mmol/L Final   CO2 05/15/2021 24  22 - 32 mmol/L Final   Glucose, Bld 05/15/2021 131 (A) 70 - 99 mg/dL Final   Glucose reference range applies only to samples taken after fasting for at least 8 hours.   BUN 05/15/2021 19  8 - 23 mg/dL Final   Creatinine, Ser 05/15/2021 0.82  0.44 - 1.00 mg/dL  Final   Calcium 05/15/2021 9.2  8.9 - 10.3 mg/dL Final   GFR, Estimated 05/15/2021 >60  >60 mL/min Final   Comment: (NOTE) Calculated using the CKD-EPI Creatinine Equation (2021)    Anion gap 05/15/2021 7  5 - 15 Final   Performed at [facility], [place] [address], [place], [place] [address]   Glucose-Capillary 05/15/2021 143 (A) 70 - 99 mg/dL Final   Glucose reference range applies only to samples taken after fasting for at least 8 hours.   Glucose-Capillary 05/15/2021 127 (A) 70 - 99 mg/dL Final   Glucose reference range applies only to samples taken after fasting for at least 8 hours.   Hgb A1c MFr Bld 05/15/2021 6.3 (A) 4.8 - 5.6 % Final   Comment: (NOTE) Pre diabetes:          5.7%-6.4%  Diabetes:              >6.4%  Glycemic control for   <7.0% adults with diabetes    Mean Plasma Glucose 05/15/2021 134.11  mg/dL Final   Performed at [facility], [place] [address] [place], [place] [address]   Glucose-Capillary 05/15/2021 225 (A) 70 - 99 mg/dL Final   Glucose reference range applies only to samples taken after fasting for at least 8 hours.   WBC 05/16/2021 13.9 (A) 4.0 - 10.5 K/uL Final   RBC 05/16/2021 4.30  3.87 - 5.11 MIL/uL Final   Hemoglobin 05/16/2021 13.0  12.0 - 15.0 g/dL Final   HCT 05/16/2021 39.7  36.0 - 46.0 % Final   MCV 05/16/2021 92.3  80.0 - 100.0 fL Final   MCH 05/16/2021 30.2  26.0 - 34.0 pg Final   MCHC 05/16/2021 32.7  30.0 - 36.0 g/dL Final   RDW 05/16/2021 13.2  11.5 - 15.5 % Final   Platelets 05/16/2021 208  150 - 400 K/uL Final   nRBC 05/16/2021 0.0  0.0 - 0.2 % Final   Performed at [facility], [place] [address] [place], [place] [address]   Sodium 05/16/2021 137  135 - 145 mmol/L Corrected   Potassium 05/16/2021 4.7  3.5 - 5.1 mmol/L Corrected   DELTA CHECK NOTED   Chloride 05/16/2021 106  98 - 111 mmol/L Corrected   CO2 05/16/2021 24  22 - 32 mmol/L Corrected   Glucose, Bld 05/16/2021 173 (A) 70 - 99 mg/dL Corrected   Glucose reference range applies only to samples taken after fasting for at least 8 hours.   BUN 05/16/2021 13  8 - 23 mg/dL Corrected   Creatinine, Ser 05/16/2021 0.62  0.44 - 1.00 mg/dL Corrected   Calcium 05/16/2021 8.7 (A) 8.9 - 10.3 mg/dL Corrected   GFR, Estimated 05/16/2021 >60  >60 mL/min Corrected   Comment: (NOTE) Calculated using the CKD-EPI Creatinine Equation (2021) CORRECTED ON 06/23 AT 0438: PREVIOUSLY REPORTED AS >60    Anion gap 05/16/2021 7  5 - 15 Corrected   Performed at [facility], [place] [address], [place],  [address]   Glucose-Capillary 05/15/2021 199 (A) 70 - 99 mg/dL Final   Glucose reference range applies only to samples taken after fasting for at least 8 hours.   Glucose-Capillary 05/16/2021 149 (A) 70 - 99 mg/dL Final   Glucose reference range applies only to samples taken after fasting for at least 8 hours.    Glucose-Capillary 05/16/2021 129 (A) 70 - 99 mg/dL Final   Glucose reference range applies only to samples taken after  fasting for at least 8 hours.   Glucose-Capillary 05/16/2021 117 (A) 70 - 99 mg/dL Final   Glucose reference range applies only to samples taken after fasting for at least 8 hours.   WBC 05/17/2021 11.5 (A) 4.0 - 10.5 K/uL Final   RBC 05/17/2021 3.99  3.87 - 5.11 MIL/uL Final   Hemoglobin 05/17/2021 11.9 (A) 12.0 - 15.0 g/dL Final   HCT 05/17/2021 36.5  36.0 - 46.0 % Final   MCV 05/17/2021 91.5  80.0 - 100.0 fL Final   MCH 05/17/2021 29.8  26.0 - 34.0 pg Final   MCHC 05/17/2021 32.6  30.0 - 36.0 g/dL Final   RDW 05/17/2021 13.6  11.5 - 15.5 % Final   Platelets 05/17/2021 197  150 - 400 K/uL Final   nRBC 05/17/2021 0.0  0.0 - 0.2 % Final   Performed at [facility], [place] [address], [place], [place] [address]   Glucose-Capillary 05/16/2021 157 (A) 70 - 99 mg/dL Final   Glucose reference range applies only to samples taken after fasting for at least 8 hours.   Glucose-Capillary 05/17/2021 130 (A) 70 - 99 mg/dL Final   Glucose reference range applies only to samples taken after fasting for at least 8 hours.   Glucose-Capillary 05/17/2021 132 (A) 70 - 99 mg/dL Final   Glucose reference range applies only to samples taken after fasting for at least 8 hours.   Glucose-Capillary 05/17/2021 155 (A) 70 - 99 mg/dL Final   Glucose reference range applies only to samples taken after fasting for at least 8 hours.  Hospital Outpatient Visit on 04/09/2021  Component Date Value Ref Range Status   SARS Coronavirus 2 04/09/2021 POSITIVE (A) NEGATIVE Final   Comment: (NOTE) SARS-CoV-2 target nucleic acids are DETECTED.  The SARS-CoV-2 RNA is generally detectable in upper and lower respiratory specimens during the acute phase of infection. Positive results are indicative of the presence of SARS-CoV-2 RNA. Clinical correlation with patient history and other diagnostic  information is  necessary to determine patient infection status. Positive results do not rule out bacterial infection or co-infection with other viruses.  The expected result is Negative.  Fact Sheet for Patients: SugarRoll.be  Fact Sheet for Healthcare Providers: https://www.woods-mathews.com/  This test is not yet approved or cleared by the [place] FDA and  has been authorized for detection and/or diagnosis of SARS-CoV-2 by FDA under an Emergency Use Authorization (EUA). This EUA will remain  in effect (meaning this test can be used) for the duration of the COVID-19 declaration under Section 564(b)(1) of the Act, 21 U.                          S.C. section 360bbb-3(b)(1), unless the authorization is terminated or revoked sooner.   Performed at [facility], [place] [address], [place], [place] [address]   Hospital Outpatient Visit on 04/04/2021  Component Date Value Ref Range Status   MRSA, PCR 04/04/2021 NEGATIVE  NEGATIVE Final   Staphylococcus aureus 04/04/2021 NEGATIVE  NEGATIVE Final   Comment: (NOTE) The Xpert SA Assay (FDA approved for NASAL specimens in patients 81 years of age and older), is one component of a comprehensive surveillance program. It is not intended to diagnose infection nor to guide or monitor treatment. Performed at [facility], [place] [address], [place], [place] [address]    WBC 04/04/2021 5.7  4.0 - 10.5 K/uL Final   RBC 04/04/2021 4.79  3.87 - 5.11 MIL/uL Final   Hemoglobin 04/04/2021 14.4  12.0 - 15.0 g/dL Final   HCT 04/04/2021 43.8  36.0 - 46.0 % Final   MCV 04/04/2021 91.4  80.0 - 100.0 fL Final   MCH 04/04/2021 30.1  26.0 - 34.0 pg Final   MCHC 04/04/2021 32.9  30.0 - 36.0 g/dL Final   RDW 04/04/2021 13.4  11.5 - 15.5 % Final   Platelets 04/04/2021 208  150 - 400 K/uL Final   nRBC 04/04/2021 0.0  0.0 - 0.2 % Final   Performed at [facility], [place]  [address], [place], [place] [address]   Sodium 04/04/2021 139  135 - 145 mmol/L Final   Potassium 04/04/2021 4.1  3.5 - 5.1 mmol/L Final   Chloride 04/04/2021 108  98 - 111 mmol/L Final   CO2 04/04/2021 26  22 - 32 mmol/L Final   Glucose, Bld 04/04/2021 161 (A) 70 - 99 mg/dL Final   Glucose reference range applies only to samples taken after fasting for at least 8 hours.   BUN 04/04/2021 16  8 - 23 mg/dL Final   Creatinine, Ser 04/04/2021 0.65  0.44 - 1.00 mg/dL Final   Calcium 04/04/2021 9.4  8.9 - 10.3 mg/dL Final   GFR, Estimated 04/04/2021 >60  >60 mL/min Final   Comment: (NOTE) Calculated using the CKD-EPI Creatinine Equation (2021)    Anion gap 04/04/2021 5  5 - 15 Final   Performed at [facility], [place] [address] [place], [place] [address]   Prothrombin Time 04/04/2021 13.0  11.4 - 15.2 seconds Final   INR 04/04/2021 1.0  0.8 - 1.2 Final   Comment: (NOTE) INR goal varies based on device and disease states. Performed at [facility], [place] [address], [place], [place] [address]    aPTT 04/04/2021 32  24 - 36 seconds Final   Performed at [facility], [place] [address], [place], [place] [address]   Color, Urine 04/04/2021 YELLOW  YELLOW Final   APPearance 04/04/2021 CLEAR  CLEAR Final   Specific Gravity, Urine 04/04/2021 1.020  1.005 - 1.030 Final   pH 04/04/2021 6.0  5.0 - 8.0 Final   Glucose, UA 04/04/2021 NEGATIVE  NEGATIVE mg/dL Final   Hgb urine dipstick 04/04/2021 NEGATIVE  NEGATIVE Final   Bilirubin Urine 04/04/2021 NEGATIVE  NEGATIVE Final   Ketones, ur 04/04/2021 NEGATIVE  NEGATIVE mg/dL Final   Protein, ur 04/04/2021 NEGATIVE  NEGATIVE mg/dL Final   Nitrite 04/04/2021 NEGATIVE  NEGATIVE Final   Leukocytes,Ua 04/04/2021 NEGATIVE  NEGATIVE Final   Performed at [facility], [place] [address] [place], [place] [address]   Glucose-Capillary 04/04/2021 179 (A) 70 - 99 mg/dL Final   Glucose reference  range applies only to samples taken after fasting for at least 8 hours.     X-Rays:DG Knee 1-2 Views Right  Result Date: 05/15/2021 CLINICAL DATA:  Total right knee arthroplasty. EXAM: RIGHT KNEE - 1-2 VIEW COMPARISON:  None. FINDINGS: The total knee arthroplasty components appear well seated. No complicating features are identified. Expected fluid and gas in the joint and gas in the soft tissues. IMPRESSION: Well seated components of a total knee arthroplasty. Electronically Signed   By: Marijo Sanes M.D.   On: 05/15/2021 11:34    EKG: Orders placed or performed during the hospital encounter of 04/04/21   EKG 12 lead per protocol   EKG 12 lead per protocol     Hospital Course:  Tanya [name] is a 64 y.o. who was admitted to [facility]. They were brought to the operating room on 05/15/2021 and underwent Procedure(s): TOTAL KNEE ARTHROPLASTY.  Patient tolerated the procedure well and was later transferred to the recovery room and then to the orthopaedic floor for postoperative care.  They were given PO and IV analgesics for pain control following their surgery.  They were given 24 hours of postoperative antibiotics of  Anti-infectives (From admission, onward)    Start     Dose/Rate Route Frequency Ordered Stop   05/15/21 1800  vancomycin (VANCOREADY) IVPB 1250 mg/250 mL       Note to Pharmacy: Per pharmacy dose   1,250 mg 166.7 mL/hr over 90 Minutes Intravenous  Once 05/15/21 1325 05/15/21 1944   05/15/21 0600  vancomycin (VANCOREADY) IVPB 1500 mg/300 mL        1,500 mg 150 mL/hr over 120 Minutes Intravenous On call to O.R. 05/14/21 3007 05/15/21 0823   05/15/21 0600  gentamicin (GARAMYCIN) 400 mg in dextrose 5 % 50 mL IVPB        400 mg 120 mL/hr over 30 Minutes Intravenous On call to O.R. 05/14/21 6226 05/15/21 3335      and started on DVT prophylaxis in the form of Aspirin, TED hose, and SCDs .   PT and OT were ordered for total joint protocol.  Discharge planning consulted  to help with postop disposition and equipment needs.  Patient had a difficult night on the evening of surgery.  They started to get up OOB with therapy on day one. Continued to work with therapy into day two. By day two, the patient had progressed with therapy and meeting their goals.  Incision was healing well.  Patient was seen in rounds and was ready to go home.   Diet: Regular diet Activity:WBAT Follow-up:in 10-14 days Disposition - Home with HHPT Discharged Condition: good   Discharge Instructions     Call MD / Call 911   Complete by: As directed    If you experience chest pain or shortness of breath, CALL 911 and be transported to the hospital emergency room.  If you develope a fever above 101 F, pus (white drainage) or increased drainage or redness at the wound, or calf pain, call your surgeon's office.   Constipation Prevention   Complete by: As directed    Drink plenty of fluids.  Prune juice may be helpful.  You may use a stool softener, such as Colace (over the counter) 100 mg twice a day.  Use MiraLax (over the counter) for constipation as needed.   Diet - low sodium heart healthy   Complete by: As directed    Increase activity slowly as tolerated   Complete by: As directed    Post-operative opioid taper instructions:   Complete by: As directed    POST-OPERATIVE OPIOID TAPER INSTRUCTIONS: It is important to wean off of your opioid medication as soon as possible. If you do not need pain medication after your surgery it is ok to stop day one. Opioids include: Codeine, Hydrocodone(Norco, Vicodin), Oxycodone(Percocet, oxycontin) and hydromorphone amongst others.  Long term and even short term use of opiods can cause: Increased pain response Dependence Constipation Depression Respiratory depression And more.  Withdrawal symptoms can include Flu like symptoms Nausea, vomiting And more Techniques to manage these symptoms Hydrate well Eat regular healthy meals Stay  active Use relaxation techniques(deep breathing, meditating, yoga) Do Not substitute Alcohol to help with tapering If  you have been on opioids for less than two weeks and do not have pain than it is ok to stop all together.  Plan to wean off of opioids This plan should start within one week post op of your joint replacement. Maintain the same interval or time between taking each dose and first decrease the dose.  Cut the total daily intake of opioids by one tablet each day Next start to increase the time between doses. The last dose that should be eliminated is the evening dose.         Allergies as of 05/17/2021       Reactions   Ace Inhibitors Hives   Amoxicillin Hives   Other Hives   Nucafed   Penicillins Hives   Has patient had a PCN reaction causing immediate rash, facial/tongue/throat swelling, SOB or lightheadedness with hypotension: Yes Has patient had a PCN reaction causing severe rash involving mucus membranes or skin necrosis: No Has patient had a PCN reaction that required hospitalization No Has patient had a PCN reaction occurring within the last 10 years: No If all of the above answers are "NO", then may proceed with Cephalosporin use.   Metformin And Related Other (See Comments)   GI upset   Wound Dressing Adhesive Other (See Comments)   Tore her skin   Pseudoephedrine Hives   Pseudoephedrine-codeine Hives        Medication List     STOP taking these medications    GLUCOSAMINE 1500 COMPLEX PO   NON FORMULARY   Oyster Shell Calcium 500 MG Tabs       TAKE these medications    albuterol 108 (90 Base) MCG/ACT inhaler Commonly known as: VENTOLIN HFA Inhale 2 puffs into the lungs every 6 (six) hours as needed for wheezing or shortness of breath.   amLODipine 10 MG tablet Commonly known as: NORVASC TAKE 1 TABLET BY MOUTH EVERY DAY IN THE MORNING   aspirin 81 MG tablet Take 1 tablet (81 mg total) by mouth daily.   calcium acetate 667 MG  capsule Commonly known as: PHOSLO Take 667 mg by mouth at bedtime.   calcium gluconate 500 MG tablet Take 1 tablet (500 mg total) by mouth daily.   celecoxib 200 MG capsule Commonly known as: CELEBREX TAKE ONE CAP BY MOUTH 2 TIMES DAILY AS NEEDED.   cetirizine 10 MG tablet Commonly known as: ZYRTEC TAKE 1 TABLET (10 MG TOTAL) BY MOUTH DAILY. What changed:  how much to take how to take this when to take this additional instructions   docusate sodium 100 MG capsule Commonly known as: Colace Take 1 capsule (100 mg total) by mouth 2 (two) times daily as needed for mild constipation.   gabapentin 300 MG capsule Commonly known as: NEURONTIN Take 1 capsule (300 mg total) by mouth 3 (three) times daily.   glipiZIDE 2.5 MG 24 hr tablet Commonly known as: GLUCOTROL XL TAKE 1 TABLET (2.5 MG TOTAL) BY MOUTH DAILY WITH BREAKFAST.   HYDROmorphone 2 MG tablet Commonly known as: DILAUDID Take 1-2 tablets (2-4 mg total) by mouth every 4 (four) hours as needed for severe pain.   irbesartan 300 MG tablet Commonly known as: AVAPRO TAKE 1 TABLET BY MOUTH EVERY DAY   methocarbamol 500 MG tablet Commonly known as: ROBAXIN Take 1 tablet (500 mg total) by mouth every 6 (six) hours as needed for muscle spasms.   montelukast 10 MG tablet Commonly known as: SINGULAIR Take 1 tablet (10 mg total) by mouth  daily. What changed: when to take this   multivitamin with minerals Tabs tablet Take 1 tablet by mouth daily.   polycarbophil 625 MG tablet Commonly known as: FIBERCON Take 625 mg by mouth daily.   polyethylene glycol 17 g packet Commonly known as: MIRALAX / GLYCOLAX Take 17 g by mouth daily.   tolterodine 4 MG 24 hr capsule Commonly known as: DETROL LA Take 1 capsule (4 mg total) by mouth daily. What changed: when to take this   traMADol 200 MG 24 hr tablet Commonly known as: ULTRAM-ER Take 1 tablet (200 mg total) by mouth daily.        Follow-up Information     Susa Day, MD Follow up in 2 week(s).   Specialty: Orthopedic Surgery Contact information: [address] [place] [address] [phone]         Care, [facility] Follow up.   Specialty: [place] Why: to provide home health physical therapy Contact information: [place] [address] [phone]                 Signed: Lacie Draft, PA-C Orthopaedic Surgery 05/29/2021, 8:31 AM

## 2021-06-23 IMAGING — DX DG KNEE 1-2V*R*
2 series · 2 of 2 positions shown · non-contrast
Comparison: None.

CLINICAL DATA: Total right knee arthroplasty.

EXAM:
RIGHT KNEE - 1-2 VIEW

[knee ap]
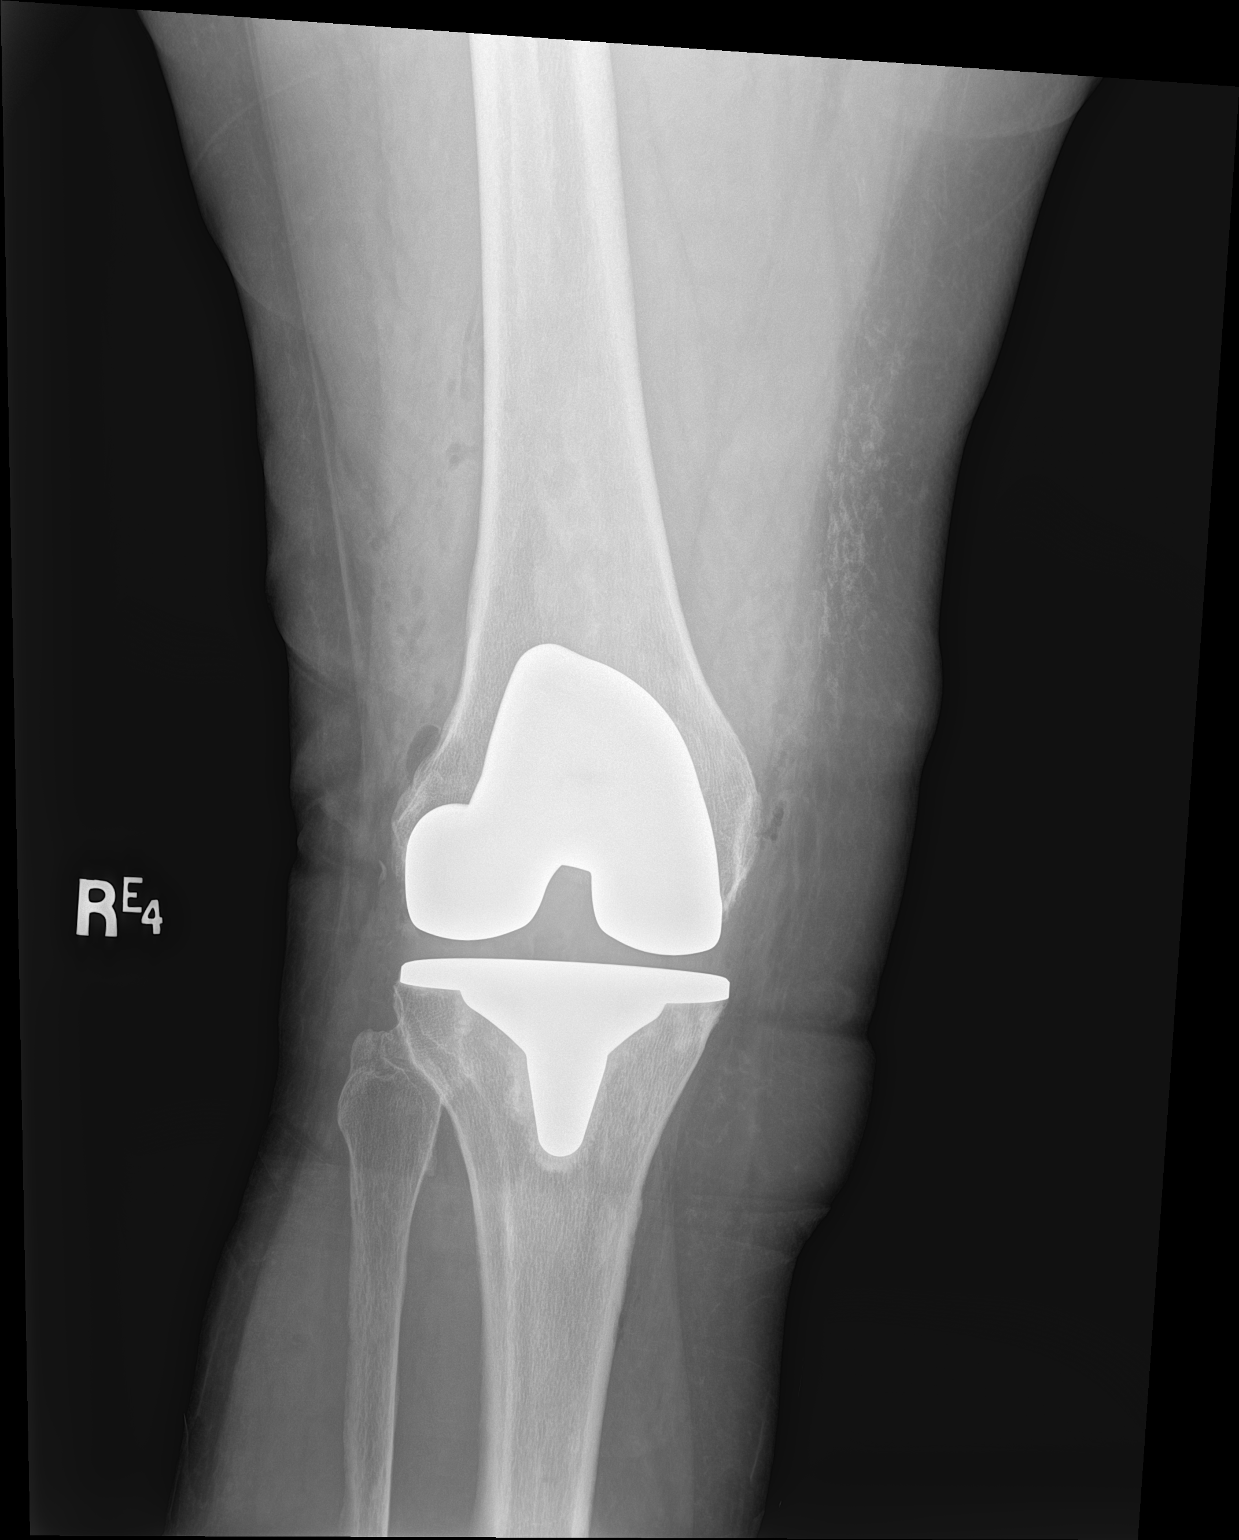

[knee lat]
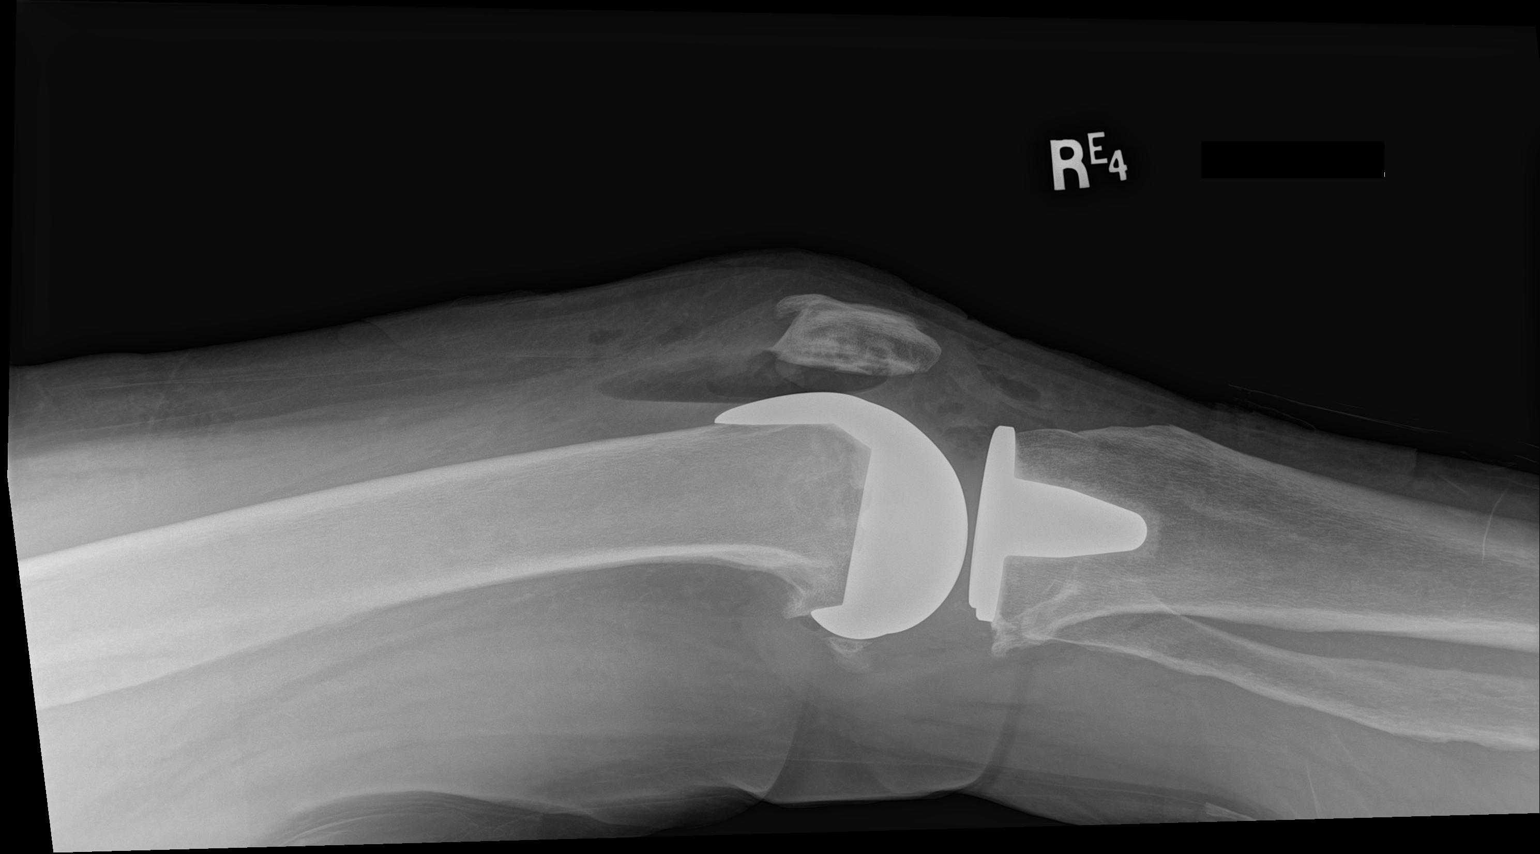

[2 of 2 positions shown; findings below may reference images not displayed]

FINDINGS: The total knee arthroplasty components appear well seated. No
complicating features are identified. Expected fluid and gas in the
joint and gas in the soft tissues.
IMPRESSION: Well seated components of a total knee arthroplasty.

## 2021-07-03 ENCOUNTER — Other Ambulatory Visit: Payer: Self-pay | Admitting: Internal Medicine

## 2021-07-21 ENCOUNTER — Other Ambulatory Visit: Payer: Self-pay | Admitting: Internal Medicine

## 2021-08-28 DIAGNOSIS — H35372 Puckering of macula, left eye: Secondary | ICD-10-CM | POA: Insufficient documentation

## 2021-10-21 LAB — COLOGUARD: Cologuard: NEGATIVE

## 2021-10-24 LAB — COLOGUARD: COLOGUARD: NEGATIVE

## 2021-10-26 ENCOUNTER — Other Ambulatory Visit: Payer: Self-pay | Admitting: Internal Medicine

## 2021-11-06 ENCOUNTER — Other Ambulatory Visit: Payer: Self-pay | Admitting: Internal Medicine

## 2021-11-06 NOTE — Telephone Encounter (Signed)
Please refill as per office routine med refill policy (all routine meds to be refilled for 3 mo or monthly (per pt preference) up to one year from last visit, then month to month grace period for 3 mo, then further med refills will have to be denied) ? ?

## 2021-12-10 DIAGNOSIS — G4733 Obstructive sleep apnea (adult) (pediatric): Secondary | ICD-10-CM | POA: Diagnosis not present

## 2021-12-17 DIAGNOSIS — G5131 Clonic hemifacial spasm, right: Secondary | ICD-10-CM | POA: Diagnosis not present

## 2022-01-31 ENCOUNTER — Other Ambulatory Visit: Payer: Self-pay | Admitting: Internal Medicine

## 2022-01-31 NOTE — Telephone Encounter (Signed)
Please refill as per office routine med refill policy (all routine meds to be refilled for 3 mo or monthly (per pt preference) up to one year from last visit, then month to month grace period for 3 mo, then further med refills will have to be denied) ? ?

## 2022-02-01 DIAGNOSIS — I10 Essential (primary) hypertension: Secondary | ICD-10-CM | POA: Diagnosis not present

## 2022-02-01 DIAGNOSIS — L02211 Cutaneous abscess of abdominal wall: Secondary | ICD-10-CM | POA: Diagnosis not present

## 2022-02-02 ENCOUNTER — Other Ambulatory Visit: Payer: Self-pay | Admitting: Internal Medicine

## 2022-02-03 ENCOUNTER — Other Ambulatory Visit: Payer: Self-pay | Admitting: Internal Medicine

## 2022-02-03 NOTE — Telephone Encounter (Signed)
Please refill as per office routine med refill policy (all routine meds to be refilled for 3 mo or monthly (per pt preference) up to one year from last visit, then month to month grace period for 3 mo, then further med refills will have to be denied) ? ?

## 2022-02-04 ENCOUNTER — Telehealth: Payer: Self-pay | Admitting: Internal Medicine

## 2022-02-04 ENCOUNTER — Telehealth: Payer: Self-pay

## 2022-02-04 DIAGNOSIS — E538 Deficiency of other specified B group vitamins: Secondary | ICD-10-CM

## 2022-02-04 DIAGNOSIS — E559 Vitamin D deficiency, unspecified: Secondary | ICD-10-CM

## 2022-02-04 DIAGNOSIS — E1165 Type 2 diabetes mellitus with hyperglycemia: Secondary | ICD-10-CM

## 2022-02-04 MED ORDER — CALCIUM ACETATE (PHOS BINDER) 667 MG PO CAPS: ORAL_CAPSULE | ORAL | 0 refills | Status: DC

## 2022-02-04 NOTE — Telephone Encounter (Signed)
Pt requesting an order for labs prior to cpe appt on 03-19-2022 ?

## 2022-02-04 NOTE — Telephone Encounter (Addendum)
Pt is calling stating that the Rx is incomplete for calcium acetate (PHOSLO) 667 MG capsule  ?TAKE 1 CAPSULE (667 MG TOTAL) BY MOUTH ONCE. ? ?Please update ? ?Pt would like a call when Rx has been updated [phone] ? ?

## 2022-02-05 MED ORDER — CALCIUM ACETATE (PHOS BINDER) 667 MG PO CAPS: ORAL_CAPSULE | ORAL | 5 refills | Status: DC

## 2022-02-05 NOTE — Addendum Note (Signed)
Addended by: Biagio Borg on: 02/05/2022 12:45 PM ? ? Modules accepted: Orders ? ?

## 2022-02-15 NOTE — Telephone Encounter (Signed)
Ok done

## 2022-02-17 NOTE — Telephone Encounter (Signed)
Patient notified

## 2022-02-23 ENCOUNTER — Other Ambulatory Visit: Payer: Self-pay | Admitting: Internal Medicine

## 2022-02-23 NOTE — Telephone Encounter (Signed)
Please refill as per office routine med refill policy (all routine meds to be refilled for 3 mo or monthly (per pt preference) up to one year from last visit, then month to month grace period for 3 mo, then further med refills will have to be denied) ? ?

## 2022-03-02 ENCOUNTER — Other Ambulatory Visit: Payer: Self-pay | Admitting: Internal Medicine

## 2022-03-12 ENCOUNTER — Other Ambulatory Visit (INDEPENDENT_AMBULATORY_CARE_PROVIDER_SITE_OTHER): Payer: Self-pay

## 2022-03-12 DIAGNOSIS — E1165 Type 2 diabetes mellitus with hyperglycemia: Secondary | ICD-10-CM

## 2022-03-12 DIAGNOSIS — E559 Vitamin D deficiency, unspecified: Secondary | ICD-10-CM

## 2022-03-12 DIAGNOSIS — E538 Deficiency of other specified B group vitamins: Secondary | ICD-10-CM

## 2022-03-12 LAB — URINALYSIS, ROUTINE W REFLEX MICROSCOPIC
Bilirubin Urine: NEGATIVE
Hgb urine dipstick: NEGATIVE
Ketones, ur: NEGATIVE
Leukocytes,Ua: NEGATIVE
Nitrite: NEGATIVE
RBC / HPF: NONE SEEN (ref 0–?)
Specific Gravity, Urine: 1.015 (ref 1.000–1.030)
Total Protein, Urine: NEGATIVE
Urine Glucose: NEGATIVE
Urobilinogen, UA: 0.2 (ref 0.0–1.0)
pH: 6.5 (ref 5.0–8.0)

## 2022-03-12 LAB — CBC WITH DIFFERENTIAL/PLATELET
Basophils Absolute: 0 10*3/uL (ref 0.0–0.1)
Basophils Relative: 0.5 % (ref 0.0–3.0)
Eosinophils Absolute: 0.1 10*3/uL (ref 0.0–0.7)
Eosinophils Relative: 2.6 % (ref 0.0–5.0)
HCT: 40.9 % (ref 36.0–46.0)
Hemoglobin: 13.9 g/dL (ref 12.0–15.0)
Lymphocytes Relative: 31 % (ref 12.0–46.0)
Lymphs Abs: 1.7 10*3/uL (ref 0.7–4.0)
MCHC: 34 g/dL (ref 30.0–36.0)
MCV: 88.7 fl (ref 78.0–100.0)
Monocytes Absolute: 0.3 10*3/uL (ref 0.1–1.0)
Monocytes Relative: 6 % (ref 3.0–12.0)
Neutro Abs: 3.3 10*3/uL (ref 1.4–7.7)
Neutrophils Relative %: 59.9 % (ref 43.0–77.0)
Platelets: 173 10*3/uL (ref 150.0–400.0)
RBC: 4.61 Mil/uL (ref 3.87–5.11)
RDW: 13.8 % (ref 11.5–15.5)
WBC: 5.5 10*3/uL (ref 4.0–10.5)

## 2022-03-12 LAB — MICROALBUMIN / CREATININE URINE RATIO
Creatinine,U: 72.4 mg/dL
Microalb Creat Ratio: 1 mg/g (ref 0.0–30.0)
Microalb, Ur: <0.7 mg/dL (ref 0.0–1.9)

## 2022-03-12 LAB — LIPID PANEL
Cholesterol: 135 mg/dL (ref 0–200)
HDL: 54.3 mg/dL (ref >39.00)
LDL Cholesterol: 65 mg/dL (ref 0–99)
NonHDL: 80.59
Total CHOL/HDL Ratio: 2
Triglycerides: 79 mg/dL (ref 0.0–149.0)
VLDL: 15.8 mg/dL (ref 0.0–40.0)

## 2022-03-12 LAB — BASIC METABOLIC PANEL
BUN: 20 mg/dL (ref 6–23)
CO2: 23 mEq/L (ref 19–32)
Calcium: 9.3 mg/dL (ref 8.4–10.5)
Chloride: 107 mEq/L (ref 96–112)
Creatinine, Ser: 0.65 mg/dL (ref 0.40–1.20)
GFR: 93.27 mL/min (ref >60.00)
Glucose, Bld: 158 mg/dL — ABNORMAL HIGH (ref 70–99)
Potassium: 4.3 mEq/L (ref 3.5–5.1)
Sodium: 140 mEq/L (ref 135–145)

## 2022-03-12 LAB — HEPATIC FUNCTION PANEL
ALT: 25 U/L (ref 0–35)
AST: 17 U/L (ref 0–37)
Albumin: 4.1 g/dL (ref 3.5–5.2)
Alkaline Phosphatase: 68 U/L (ref 39–117)
Bilirubin, Direct: 0.1 mg/dL (ref 0.0–0.3)
Total Bilirubin: 0.5 mg/dL (ref 0.2–1.2)
Total Protein: 6.6 g/dL (ref 6.0–8.3)

## 2022-03-12 LAB — VITAMIN B12: Vitamin B-12: 315 pg/mL (ref 211–911)

## 2022-03-12 LAB — TSH: TSH: 0.85 u[IU]/mL (ref 0.35–5.50)

## 2022-03-12 LAB — VITAMIN D 25 HYDROXY (VIT D DEFICIENCY, FRACTURES): VITD: 40.37 ng/mL (ref 30.00–100.00)

## 2022-03-12 LAB — HEMOGLOBIN A1C: Hgb A1c MFr Bld: 7.1 % — ABNORMAL HIGH (ref 4.6–6.5)

## 2022-03-18 DIAGNOSIS — G5131 Clonic hemifacial spasm, right: Secondary | ICD-10-CM | POA: Diagnosis not present

## 2022-03-19 ENCOUNTER — Ambulatory Visit (INDEPENDENT_AMBULATORY_CARE_PROVIDER_SITE_OTHER): Payer: 59 | Admitting: Internal Medicine

## 2022-03-19 ENCOUNTER — Telehealth: Payer: Self-pay

## 2022-03-19 ENCOUNTER — Encounter: Payer: Self-pay | Admitting: Internal Medicine

## 2022-03-19 VITALS — BP 126/68 | HR 86 | Temp 98.0°F | Ht 65.0 in | Wt 268.0 lb

## 2022-03-19 DIAGNOSIS — Z0001 Encounter for general adult medical examination with abnormal findings: Secondary | ICD-10-CM | POA: Diagnosis not present

## 2022-03-19 DIAGNOSIS — E538 Deficiency of other specified B group vitamins: Secondary | ICD-10-CM

## 2022-03-19 DIAGNOSIS — I10 Essential (primary) hypertension: Secondary | ICD-10-CM

## 2022-03-19 DIAGNOSIS — Z6841 Body Mass Index (BMI) 40.0 and over, adult: Secondary | ICD-10-CM | POA: Diagnosis not present

## 2022-03-19 DIAGNOSIS — J4520 Mild intermittent asthma, uncomplicated: Secondary | ICD-10-CM

## 2022-03-19 DIAGNOSIS — J309 Allergic rhinitis, unspecified: Secondary | ICD-10-CM | POA: Diagnosis not present

## 2022-03-19 DIAGNOSIS — E1165 Type 2 diabetes mellitus with hyperglycemia: Secondary | ICD-10-CM | POA: Diagnosis not present

## 2022-03-19 DIAGNOSIS — E559 Vitamin D deficiency, unspecified: Secondary | ICD-10-CM

## 2022-03-19 MED ORDER — OZEMPIC (0.25 OR 0.5 MG/DOSE) 2 MG/1.5ML ~~LOC~~ SOPN
0.5000 mg | PEN_INJECTOR | SUBCUTANEOUS | 3 refills | Status: DC
Start: 2022-03-19 — End: 2022-08-21

## 2022-03-19 MED ORDER — FLUTICASONE-SALMETEROL 250-50 MCG/ACT IN AEPB: 1.0000 | INHALATION_SPRAY | Freq: Two times a day (BID) | RESPIRATORY_TRACT | 3 refills | Status: DC

## 2022-03-19 NOTE — Telephone Encounter (Signed)
Pt is requesting refill on: ?amLODipine (NORVASC) 10 MG tablet ?calcium acetate (PHOSLO) 667 MG capsule ?celecoxib (CELEBREX) 200 MG capsule ?irbesartan (AVAPRO) 300 MG tablet ?montelukast (SINGULAIR) 10 MG tablet ? ?Pharmacy: ?[id number] [facility] [address]? ?LOV 03/19/22 ?ROV 09/18/22 ?

## 2022-03-19 NOTE — Patient Instructions (Addendum)
Please take all new medication as prescribed - the ozempic ? ?Ok to stop the glipizide if you are able to start the ozempic ? ?Please take all new medication as prescribed - the advair to hopefully help the cough ? ?Please also consider taking OTC nasacort for allergies as well ? ?Please continue all other medications as before, and refills have been done if requested. ? ?Please have the pharmacy call with any other refills you may need. ? ?Please continue your efforts at being more active, low cholesterol diet, and weight control. ? ?You are otherwise up to date with prevention measures today. ? ?Please keep your appointments with your specialists as you may have planned ? ?Please make an Appointment to return in 6 months, or sooner if needed, also with Lab Appointment for testing done 3-5 days before at the [facility] (so this is for TWO appointments - please see the scheduling desk as you leave) ? ?Due to the ongoing Covid 19 pandemic, our lab now requires an appointment for any labs done at our office.  If you need labs done and do not have an appointment, please call our office ahead of time to schedule before presenting to the lab for your testing. ?] ? ?

## 2022-03-19 NOTE — Progress Notes (Signed)
Patient ID: Tanya [name], female   DOB: 10/19/1956, 66 y.o.   MRN: [id number] ? ? ? ?     Chief Complaint:: wellness exam and Annual Exam (Patient c/o having a persistent cough) ? , dm, obesity, allergy/asthma ? ?     HPI:  Tanya [name] is a 66 y.o. female here for wellness exam; declines optho exam, mammogram, foot exam o/w up to date  ?         ?              Also doing well after right knee TKR June 2022/.  Husband retired, inherited a house in [place], she was helping with this and fell to her backside but no injury.  Has regained some wt she lost. Willing to consider ozempic.  Does have several wks ongoing nasal allergy symptoms with clearish congestion, itch and sneezing, without fever, pain, ST, cough, swelling but has increased cough and mild wheezing as well in the past day.  Pt denies chest pain, orthopnea, PND, increased LE swelling, palpitations, dizziness or syncope.   Pt denies polydipsia, polyuria, or new focal neuro s/s.    Pt denies fever, night sweats, loss of appetite, or other constitutional symptoms   ?  ?Wt Readings from Last 3 Encounters:  ?03/19/22 268 lb (121.6 kg)  ?05/15/21 246 lb (111.6 kg)  ?04/04/21 247 lb (112 kg)  ? ?BP Readings from Last 3 Encounters:  ?03/19/22 126/68  ?05/17/21 133/71  ?04/04/21 (!) 152/84  ? ?Immunization History  ?Administered Date(s) Administered  ? H1N1 10/30/2008  ? Influenza Nasal 08/16/2016  ? Influenza Split 08/24/2019  ? Influenza Whole 08/24/1998, 07/25/2009, 08/24/2010, 08/24/2012  ? Influenza,inj,Quad PF,6+ Mos 10/02/2018, 08/22/2019, 08/10/2020  ? Influenza,inj,Quad PF,6-35 Mos 09/06/2015  ? Influenza,inj,quad, With Preservative 08/20/2016, 09/12/2017  ? Influenza-Unspecified 09/03/2013, 08/22/2016, 09/23/2017, 10/02/2018  ? PFIZER Comirnaty(Gray Top)Covid-19 Tri-Sucrose Vaccine 02/12/2020, 03/04/2020  ? Pneumococcal Conjugate-13 02/08/2016  ? Pneumococcal Polysaccharide-23 01/11/2018  ? Td 11/25/1995, 11/24/2005  ? Tdap 02/08/2016  ? Zoster  Recombinat (Shingrix) 04/06/2018, 10/09/2018  ?There are no preventive care reminders to display for this patient. ?  ? ?Past Medical History:  ?Diagnosis Date  ? Allergic rhinitis   ? Blepharospasm 10/09/2010  ? Cancer [facility])   ? Skin  ? Diabetes mellitus without complication ([place])   ? Hip pain   ? History of melanoma excision   ? MULTIPLE SITES  ? Hypertension   ? Macular pucker 04/2021  ? Left  ? Mild intermittent asthma   ? OA (osteoarthritis)   ? PMB (postmenopausal bleeding)   ? PONV (postoperative nausea and vomiting)   ? did not experience after D&C, request to use same medicaitons from that procedure  ? Precancerous changes of the cervix   ? Sleep apnea   ? tested in home around 2 yrs ago  ? Varicose vein   ? wears compression stocking  ? ?Past Surgical History:  ?Procedure Laterality Date  ? ABDOMINAL HYSTERECTOMY    ? COLONOSCOPY    ? DILATION AND CURETTAGE OF UTERUS    ? ESOPHAGOGASTRODUODENOSCOPY  10/14/2004  ? HAMMER TOE SURGERY    ? HYSTEROSCOPY WITH D & C N/A 04/07/2016  ? Procedure: DILATATION AND CURETTAGE /HYSTEROSCOPY WITH MYOSURE POLYPECTOMY;  Surgeon: Cheri Fowler, MD;  Location: [facility];  Service: Gynecology;  Laterality: N/A;  ? JOINT REPLACEMENT    ? KNEE ARTHROSCOPY Bilateral left 08-01-2005/  right  ? LAPAROSCOPIC HYSTERECTOMY N/A 06/02/2016  ? Procedure: HYSTERECTOMY TOTAL  LAPAROSCOPIC;  Surgeon: Cheri Fowler, MD;  Location: [facility];  Service: Gynecology;  Laterality: N/A;  ? LAPAROSCOPIC SALPINGO OOPHERECTOMY Bilateral 06/02/2016  ? Procedure: LAPAROSCOPIC SALPINGO OOPHORECTOMY;  Surgeon: Cheri Fowler, MD;  Location: [facility];  Service: Gynecology;  Laterality: Bilateral;  ? RE-DO ROTATOR CUFF REPAIR/  SAD/  ACROMIOPLASTY Right 01-04-2009  ? SHOULDER ARTHROSCOPY W/ ROTATOR CUFF REPAIR Right   ? tailor bunion removal    ? TONSILLECTOMY    ? TOTAL HIP ARTHROPLASTY Right 09/12/2016  ? Procedure: RIGHT TOTAL HIP ARTHROPLASTY ANTERIOR APPROACH;  Surgeon: Rod Can, MD;   Location: [place];  Service: Orthopedics;  Laterality: Right;  ? TOTAL HIP ARTHROPLASTY Right 10/01/2016  ? Procedure: IRRIGATION AND DEBRIDEMENT RIGHT HIP;  Surgeon: Rod Can, MD;  Location: [place];  Service: Orthopedics;  Laterality: Right;  ? TOTAL KNEE ARTHROPLASTY Left 12/29/2013  ? Procedure: LEFT TOTAL KNEE ARTHROPLASTY;  Surgeon: Johnn Hai, MD;  Location: [facility];  Service: Orthopedics;  Laterality: Left;  ? TOTAL KNEE ARTHROPLASTY Right 05/15/2021  ? Procedure: TOTAL KNEE ARTHROPLASTY;  Surgeon: Susa Day, MD;  Location: [facility];  Service: Orthopedics;  Laterality: Right;  2.5 hrs  ? ? reports that she quit smoking about 34 years ago. Her smoking use included cigarettes. She has a 20.00 pack-year smoking history. She has never used smokeless tobacco. She reports current alcohol use of about 2.0 standard drinks per week. She reports that she does not use drugs. ?family history includes Asthma in an other family member; Diabetes in an other family member; Esophageal cancer in her father; Melanoma in her sister; Ovarian cancer in an other family member. ?Allergies  ?Allergen Reactions  ? Ace Inhibitors Hives  ? Amoxicillin Hives  ? Other Hives  ?  Nucafed  ? Penicillins Hives  ?  Has patient had a PCN reaction causing immediate rash, facial/tongue/throat swelling, SOB or lightheadedness with hypotension: Yes ?Has patient had a PCN reaction causing severe rash involving mucus membranes or skin necrosis: No ?Has patient had a PCN reaction that required hospitalization No ?Has patient had a PCN reaction occurring within the last 10 years: No ?If all of the above answers are "NO", then may proceed with Cephalosporin use. ?  ? Metformin And Related Other (See Comments)  ?  GI upset  ? Wound Dressing Adhesive Other (See Comments)  ?  Tore her skin  ? Pseudoephedrine Hives  ? Pseudoephedrine-Codeine Hives  ? ?Current Outpatient Medications on File Prior to Visit  ?Medication Sig Dispense Refill  ? albuterol  (VENTOLIN HFA) 108 (90 Base) MCG/ACT inhaler Inhale 2 puffs into the lungs every 6 (six) hours as needed for wheezing or shortness of breath. 54 g 3  ? aspirin 81 MG tablet Take 1 tablet (81 mg total) by mouth daily. 60 tablet 1  ? cetirizine (ZYRTEC) 10 MG tablet TAKE 1 TABLET (10 MG TOTAL) BY MOUTH DAILY. 90 tablet 3  ? gabapentin (NEURONTIN) 300 MG capsule Take 1 capsule (300 mg total) by mouth 3 (three) times daily. 90 capsule 1  ? glipiZIDE (GLUCOTROL XL) 2.5 MG 24 hr tablet TAKE 1 TABLET BY MOUTH DAILY WITH BREAKFAST. 30 tablet 0  ? methocarbamol (ROBAXIN) 500 MG tablet Take 1 tablet (500 mg total) by mouth every 6 (six) hours as needed for muscle spasms. 30 tablet 1  ? Multiple Vitamin (MULTIVITAMIN WITH MINERALS) TABS tablet Take 1 tablet by mouth daily.    ? oxybutynin (DITROPAN-XL) 10 MG 24 hr tablet oxybutynin chloride ER 10  mg tablet,extended release 24 hr    ? polycarbophil (FIBERCON) 625 MG tablet Take 625 mg by mouth daily.    ? ?No current facility-administered medications on file prior to visit.  ? ?     ROS:  All others reviewed and negative. ? ?Objective  ? ?     PE:  BP 126/68 (BP Location: Right Arm, Patient Position: Sitting, Cuff Size: Large)   Pulse 86   Temp 98 ?F (36.7 ?C) (Oral)   Ht '5\' 5"'$  (1.651 m)   Wt 268 lb (121.6 kg)   LMP 09/13/2013   SpO2 98%   BMI 44.60 kg/m?  ? ?              Constitutional: Pt appears in NAD ?              HENT: Head: NCAT.  ?              Right Ear: External ear normal.   ?              Left Ear: External ear normal. Bilat tm's with mild erythema.  Max sinus areas non tender.  Pharynx with mild erythema, no exudate ?              Eyes: . Pupils are equal, round, and reactive to light. Conjunctivae and EOM are normal ?              Nose: without d/c or deformity ?              Neck: Neck supple. Gross normal ROM ?              Cardiovascular: Normal rate and regular rhythm.   ?              Pulmonary/Chest: Effort normal and breath sounds decreased without  rales but with few bilateral wheezing.  ?              Abd:  Soft, NT, ND, + BS, no organomegaly ?              Neurological: Pt is alert. At baseline orientation, motor grossly intact ?              Skin: Skin i

## 2022-03-20 ENCOUNTER — Encounter: Payer: Self-pay | Admitting: Internal Medicine

## 2022-03-20 DIAGNOSIS — L814 Other melanin hyperpigmentation: Secondary | ICD-10-CM | POA: Diagnosis not present

## 2022-03-20 DIAGNOSIS — L821 Other seborrheic keratosis: Secondary | ICD-10-CM | POA: Diagnosis not present

## 2022-03-20 DIAGNOSIS — Z85820 Personal history of malignant melanoma of skin: Secondary | ICD-10-CM | POA: Diagnosis not present

## 2022-03-20 DIAGNOSIS — D225 Melanocytic nevi of trunk: Secondary | ICD-10-CM | POA: Diagnosis not present

## 2022-03-20 MED ORDER — AMLODIPINE BESYLATE 10 MG PO TABS: 10.0000 mg | ORAL_TABLET | Freq: Every day | ORAL | 3 refills | Status: DC

## 2022-03-20 MED ORDER — CELECOXIB 200 MG PO CAPS: ORAL_CAPSULE | ORAL | 0 refills | Status: DC

## 2022-03-20 MED ORDER — IRBESARTAN 300 MG PO TABS: 300.0000 mg | ORAL_TABLET | Freq: Every day | ORAL | 3 refills | Status: DC

## 2022-03-20 MED ORDER — FLUTICASONE-SALMETEROL 250-50 MCG/ACT IN AEPB: 1.0000 | INHALATION_SPRAY | Freq: Two times a day (BID) | RESPIRATORY_TRACT | 3 refills | Status: DC

## 2022-03-20 MED ORDER — MONTELUKAST SODIUM 10 MG PO TABS: 10.0000 mg | ORAL_TABLET | Freq: Every day | ORAL | 3 refills | Status: DC

## 2022-03-20 MED ORDER — CALCIUM ACETATE (PHOS BINDER) 667 MG PO CAPS: ORAL_CAPSULE | ORAL | 3 refills | Status: DC

## 2022-03-20 NOTE — Telephone Encounter (Signed)
Pt had her cpx yesterday sent renewal on meds requested../l,mb ?

## 2022-03-20 NOTE — Telephone Encounter (Signed)
Patient states that inhaler was not sent to pharmacy ?

## 2022-03-22 ENCOUNTER — Encounter: Payer: Self-pay | Admitting: Internal Medicine

## 2022-03-23 ENCOUNTER — Encounter: Payer: Self-pay | Admitting: Internal Medicine

## 2022-03-23 NOTE — Assessment & Plan Note (Signed)
Lab Results  ?Component Value Date  ? HGBA1C 7.1 (H) 03/12/2022  ? ?Mild uncontrolled, pt to continue current medical treatment except to d/c the glipizide if can add ozempic for sugar and wt loss ? ?

## 2022-03-23 NOTE — Assessment & Plan Note (Signed)
Age and sex appropriate education and counseling updated with regular exercise and diet ?Referrals for preventative services - pt will call for optho exam and mammogram ?Immunizations addressed - none needed ?Smoking counseling  - none needed ?Evidence for depression or other mood disorder - none significant ?Most recent labs reviewed. ?I have personally reviewed and have noted: ?1) the patient's medical and social history ?2) The patient's current medications and supplements ?3) The patient's height, weight, and BMI have been recorded in the chart ? ?

## 2022-03-23 NOTE — Assessment & Plan Note (Signed)
Ok to add the ozempic, lower calories,  to f/u any worsening symptoms or concerns ?

## 2022-03-23 NOTE — Assessment & Plan Note (Signed)
BP Readings from Last 3 Encounters:  ?03/19/22 126/68  ?05/17/21 133/71  ?04/04/21 (!) 152/84  ? ?Stable, pt to continue medical treatment norvasc, avapro ?

## 2022-03-23 NOTE — Assessment & Plan Note (Signed)
Mild to mod seasonal flare - for nasacort asd,  to f/u any worsening symptoms or concerns ?

## 2022-03-23 NOTE — Assessment & Plan Note (Signed)
afeb with cough, mild sob and dyspnea, for add advair bid,  to f/u any worsening symptoms or concerns ?

## 2022-03-25 ENCOUNTER — Other Ambulatory Visit: Payer: Self-pay | Admitting: Internal Medicine

## 2022-04-01 ENCOUNTER — Other Ambulatory Visit: Payer: Self-pay | Admitting: Internal Medicine

## 2022-04-01 NOTE — Telephone Encounter (Signed)
Please refill as per office routine med refill policy (all routine meds to be refilled for 3 mo or monthly (per pt preference) up to one year from last visit, then month to month grace period for 3 mo, then further med refills will have to be denied) ? ?

## 2022-04-29 DIAGNOSIS — B999 Unspecified infectious disease: Secondary | ICD-10-CM | POA: Diagnosis not present

## 2022-04-29 DIAGNOSIS — L089 Local infection of the skin and subcutaneous tissue, unspecified: Secondary | ICD-10-CM | POA: Diagnosis not present

## 2022-04-29 DIAGNOSIS — L02211 Cutaneous abscess of abdominal wall: Secondary | ICD-10-CM | POA: Diagnosis not present

## 2022-04-30 ENCOUNTER — Other Ambulatory Visit: Payer: Self-pay | Admitting: Internal Medicine

## 2022-05-01 DIAGNOSIS — Z1389 Encounter for screening for other disorder: Secondary | ICD-10-CM | POA: Diagnosis not present

## 2022-05-01 DIAGNOSIS — Z130 Encounter for screening for diseases of the blood and blood-forming organs and certain disorders involving the immune mechanism: Secondary | ICD-10-CM | POA: Diagnosis not present

## 2022-05-01 DIAGNOSIS — Z01419 Encounter for gynecological examination (general) (routine) without abnormal findings: Secondary | ICD-10-CM | POA: Diagnosis not present

## 2022-05-01 DIAGNOSIS — N3281 Overactive bladder: Secondary | ICD-10-CM | POA: Diagnosis not present

## 2022-05-01 DIAGNOSIS — Z6841 Body Mass Index (BMI) 40.0 and over, adult: Secondary | ICD-10-CM | POA: Diagnosis not present

## 2022-05-21 ENCOUNTER — Telehealth: Payer: Self-pay

## 2022-05-21 MED ORDER — BUDESONIDE-FORMOTEROL FUMARATE 160-4.5 MCG/ACT IN AERO: 2.0000 | INHALATION_SPRAY | Freq: Two times a day (BID) | RESPIRATORY_TRACT | 11 refills | Status: DC

## 2022-05-21 NOTE — Telephone Encounter (Signed)
Pt was in to see Dr. Jenny Reichmann 02/2022 and was prescribed a new inhaler for coughing. Pt started fluticasone-salmeterol (ADVAIR) 250-50 MCG/ACT AEPB 02/2022 and the cough and been constant since starting this.  Pt states at times the coughing is so intense that she gags.  Pt is requesting a referral to ENT to see if something is triggering this cough.   Please advise

## 2022-05-21 NOTE — Telephone Encounter (Signed)
We can do the referral but FIRST I was hoping we could just change the inhaler, as sometimes the cough is from this  I will change to symbicort and hopefully she will see improvement; if not, let us know for referral may be to pulmonary or ENT

## 2022-05-21 NOTE — Telephone Encounter (Signed)
Called pt to let her know Dr. Jenny Reichmann is suggesting to change inhaler to symbicort and hopefully she will see improvement; if not, let him know and a referral  to pulmonary or ENT will be placed.   I also advised her that the medication was sent to [facility] in [place], [place]

## 2022-06-10 DIAGNOSIS — G5131 Clonic hemifacial spasm, right: Secondary | ICD-10-CM | POA: Diagnosis not present

## 2022-06-16 ENCOUNTER — Encounter: Payer: Self-pay | Admitting: Internal Medicine

## 2022-06-16 DIAGNOSIS — J4520 Mild intermittent asthma, uncomplicated: Secondary | ICD-10-CM

## 2022-06-17 DIAGNOSIS — L820 Inflamed seborrheic keratosis: Secondary | ICD-10-CM | POA: Diagnosis not present

## 2022-06-17 DIAGNOSIS — L02211 Cutaneous abscess of abdominal wall: Secondary | ICD-10-CM | POA: Diagnosis not present

## 2022-06-17 DIAGNOSIS — B999 Unspecified infectious disease: Secondary | ICD-10-CM | POA: Diagnosis not present

## 2022-06-17 DIAGNOSIS — Z129 Encounter for screening for malignant neoplasm, site unspecified: Secondary | ICD-10-CM | POA: Diagnosis not present

## 2022-08-06 DIAGNOSIS — Z1231 Encounter for screening mammogram for malignant neoplasm of breast: Secondary | ICD-10-CM | POA: Diagnosis not present

## 2022-08-21 ENCOUNTER — Telehealth: Payer: Self-pay | Admitting: Pulmonary Disease

## 2022-08-21 ENCOUNTER — Encounter: Payer: Self-pay | Admitting: Pulmonary Disease

## 2022-08-21 ENCOUNTER — Ambulatory Visit (INDEPENDENT_AMBULATORY_CARE_PROVIDER_SITE_OTHER): Payer: 59

## 2022-08-21 ENCOUNTER — Ambulatory Visit: Payer: 59 | Admitting: Pulmonary Disease

## 2022-08-21 VITALS — BP 114/66 | HR 100 | Temp 97.8°F | Ht 66.0 in | Wt 267.8 lb

## 2022-08-21 DIAGNOSIS — G4733 Obstructive sleep apnea (adult) (pediatric): Secondary | ICD-10-CM

## 2022-08-21 DIAGNOSIS — R053 Chronic cough: Secondary | ICD-10-CM | POA: Insufficient documentation

## 2022-08-21 NOTE — Telephone Encounter (Signed)
Patient needs clarification on taking Sinus PE at night. Patient states she was told to take at night but the instructions say every 4 hours.  Please call back at [phone].

## 2022-08-21 NOTE — Patient Instructions (Signed)
  X spirometry  X CXR for chronic cough  X If above normal, then we will try treatment with -chlortabs / chlorpheniramine 4 mg or Sinus PE (CPM + phenyephrine)  every night x 3 -4 weeks   - If no relief ,then call us for Rx with protonix for GERD x 4 weeks

## 2022-08-21 NOTE — Assessment & Plan Note (Signed)
Spirometry is normal, unlikely to be cough variant of asthma.  Chest x-ray is reassuring does not show new infiltrates or effusion some calcification probably related to old infection/inflammation. We will treat for sequentially for postnasal drip and reflux -We will try chlorpheniramine/pseudoephedrine combination for 4 weeks, she will report back for improvement. -If limited improvement then we will trial Protonix for 4 weeks for reflux  If above work-up negative then can consider investigated including CT chest

## 2022-08-21 NOTE — Assessment & Plan Note (Signed)
CPAP download was reviewed which shows excellent control of events on auto settings 5 to 15 cm with average pressure of 13 and maximum pressure of 14.5 cm.  She is very compliant and CPAP is only helped improve her daytime somnolence and fatigue Weight loss encouraged, compliance with goal of at least 4-6 hrs every night is the expectation. Advised against medications with sedative side effects Cautioned against driving when sleepy - understanding that sleepiness will vary on a day to day basis

## 2022-08-21 NOTE — Progress Notes (Signed)
Subjective:    Patient ID: Tanya [name], female    DOB: 03-18-1956, 66 y.o.   MRN: [id number]  HPI  66 yo ex smoker for fu of obstructive sleep apnea & mild int asthma .  She moved from [place] to [place].  PSG in [place] in 1997/98 @ [facility] (not available) >> severe obstructive sleep apnea & set up on nasal CPAP - supplier is [facility].  Asthma was diagnosed as a child , she grew out of this as an adult & is now back to using pro-air , 2 cannisters/ year.    She uses a mouth guard for bruxism  PMH -COVID infection 04/2021  Chief Complaint  Patient presents with   Follow-up    Follow up. Patient has had a cough since march.     Last seen 12/2019 - referred for eval of asthma/cough She now has a winter home in [place] She reports a nonproductive cough since March 2023 -evaluated by PCP and attributed to allergies and asthma.  Treated with Advair and then switched to Symbicort without significant relief she has also used Flonase in the last 2 weeks without significant relief. She reports a tickle in her throat on occasion, cough wakes her up at night sometimes, no seasonal variation.  After choir practice, she feels like coughing. She denies overt heartburn and no wheezing.  Her dad had a chronic cough which eventually was diagnosed as esophageal cancer and she is worried She smoked about 10 pack years before she quit in 1989  CPAP is working well and she denies any problems with mask or pressure  Significant tests/ events reviewed  Spirometry 08/21/2022 shows no evidence of airway obstruction, ratio 80, FEV1 82% and FVC 78%    Past Medical History:  Diagnosis Date   Allergic rhinitis    Blepharospasm 10/09/2010   Cancer ([place])    Skin   Diabetes mellitus without complication ([facility])    Hip pain    History of melanoma excision    MULTIPLE SITES   Hypertension    Macular pucker 04/2021   Left   Mild intermittent asthma    OA (osteoarthritis)    PMB  (postmenopausal bleeding)    PONV (postoperative nausea and vomiting)    did not experience after D&C, request to use same medicaitons from that procedure   Precancerous changes of the cervix    Sleep apnea    tested in home around 2 yrs ago   Varicose vein    wears compression stocking    Past Surgical History:  Procedure Laterality Date   ABDOMINAL HYSTERECTOMY     COLONOSCOPY     DILATION AND CURETTAGE OF UTERUS     ESOPHAGOGASTRODUODENOSCOPY  10/14/2004   HAMMER TOE SURGERY     HYSTEROSCOPY WITH D & C N/A 04/07/2016   Procedure: DILATATION AND CURETTAGE /HYSTEROSCOPY WITH MYOSURE POLYPECTOMY;  Surgeon: Cheri Fowler, MD;  Location: [facility];  Service: Gynecology;  Laterality: N/A;   JOINT REPLACEMENT     KNEE ARTHROSCOPY Bilateral left 08-01-2005/  right   LAPAROSCOPIC HYSTERECTOMY N/A 06/02/2016   Procedure: HYSTERECTOMY TOTAL LAPAROSCOPIC;  Surgeon: Cheri Fowler, MD;  Location: [facility];  Service: Gynecology;  Laterality: N/A;   LAPAROSCOPIC SALPINGO OOPHERECTOMY Bilateral 06/02/2016   Procedure: LAPAROSCOPIC SALPINGO OOPHORECTOMY;  Surgeon: Cheri Fowler, MD;  Location: [facility];  Service: Gynecology;  Laterality: Bilateral;   RE-DO ROTATOR CUFF REPAIR/  SAD/  ACROMIOPLASTY Right 01-04-2009   SHOULDER ARTHROSCOPY  W/ ROTATOR CUFF REPAIR Right    tailor bunion removal     TONSILLECTOMY     TOTAL HIP ARTHROPLASTY Right 09/12/2016   Procedure: RIGHT TOTAL HIP ARTHROPLASTY ANTERIOR APPROACH;  Surgeon: Rod Can, MD;  Location: [facility];  Service: Orthopedics;  Laterality: Right;   TOTAL HIP ARTHROPLASTY Right 10/01/2016   Procedure: IRRIGATION AND DEBRIDEMENT RIGHT HIP;  Surgeon: Rod Can, MD;  Location: [facility];  Service: Orthopedics;  Laterality: Right;   TOTAL KNEE ARTHROPLASTY Left 12/29/2013   Procedure: LEFT TOTAL KNEE ARTHROPLASTY;  Surgeon: Johnn Hai, MD;  Location: [facility];  Service: Orthopedics;  Laterality: Left;   TOTAL KNEE ARTHROPLASTY Right  05/15/2021   Procedure: TOTAL KNEE ARTHROPLASTY;  Surgeon: Susa Day, MD;  Location: [facility];  Service: Orthopedics;  Laterality: Right;  2.5 hrs    Allergies  Allergen Reactions   Ace Inhibitors Hives   Amoxicillin Hives   Other Hives    Nucafed   Penicillins Hives    Has patient had a PCN reaction causing immediate rash, facial/tongue/throat swelling, SOB or lightheadedness with hypotension: Yes Has patient had a PCN reaction causing severe rash involving mucus membranes or skin necrosis: No Has patient had a PCN reaction that required hospitalization No Has patient had a PCN reaction occurring within the last 10 years: No If all of the above answers are "NO", then may proceed with Cephalosporin use.    Metformin And Related Other (See Comments)    GI upset   Wound Dressing Adhesive Other (See Comments)    Tore her skin   Pseudoephedrine Hives   Pseudoephedrine-Codeine Hives    Social History   Socioeconomic History   Marital status: Married    Spouse name: Not on file   Number of children: Not on file   Years of education: Not on file   Highest education level: Not on file  Occupational History   Occupation: Web designer  Tobacco Use   Smoking status: Former    Packs/day: 2.00    Years: 10.00    Total pack years: 20.00    Types: Cigarettes    Quit date: 11/25/1987    Years since quitting: 34.7   Smokeless tobacco: Never  Vaping Use   Vaping Use: Never used  Substance and Sexual Activity   Alcohol use: Yes    Alcohol/week: 2.0 standard drinks of alcohol    Types: 2 Glasses of wine per week    Comment: occ   Drug use: No   Sexual activity: Yes  Other Topics Concern   Not on file  Social History Narrative   1 dog and 1 cat   Social Determinants of Health   Financial Resource Strain: Not on file  Food Insecurity: Not on file  Transportation Needs: Not on file  Physical Activity: Not on file  Stress: Not on file  Social Connections: Not on  file  Intimate Partner Violence: Not on file       Review of Systems neg for any significant sore throat, dysphagia, itching, sneezing, nasal congestion or excess/ purulent secretions, fever, chills, sweats, unintended wt loss, pleuritic or exertional cp, hempoptysis, orthopnea pnd or change in chronic leg swelling. Also denies presyncope, palpitations, heartburn, abdominal pain, nausea, vomiting, diarrhea or change in bowel or urinary habits, dysuria,hematuria, rash, arthralgias, visual complaints, headache, numbness weakness or ataxia.     Objective:   Physical Exam  Gen. Pleasant, obese, in no distress ENT - no lesions, no post nasal drip Neck: No  JVD, no thyromegaly, no carotid bruits Lungs: no use of accessory muscles, no dullness to percussion, decreased without rales or rhonchi  Cardiovascular: Rhythm regular, heart sounds  normal, no murmurs or gallops, no peripheral edema Musculoskeletal: No deformities, no cyanosis or clubbing , no tremors       Assessment & Plan:

## 2022-08-22 NOTE — Telephone Encounter (Signed)
Spoke with the pt  Advised to take med bedtime as instructed by Dr Elsworth Soho and call next wk if not improving She verbalized understanding  Nothing further needed

## 2022-08-25 ENCOUNTER — Telehealth: Payer: Self-pay | Admitting: *Deleted

## 2022-08-25 DIAGNOSIS — G4733 Obstructive sleep apnea (adult) (pediatric): Secondary | ICD-10-CM

## 2022-08-25 NOTE — Telephone Encounter (Signed)
Patient states that her CPAP machine received an error code of Motor Life not supported and has contacted [facility].  Per patient, [facility] will need a new prescription for CPAP machine replacement. Fax [phone]  Please contact patient once fax is in place.

## 2022-08-25 NOTE — Telephone Encounter (Signed)
Attempted to call pt but unable to reach. Left message for her to return call. 

## 2022-08-25 NOTE — Telephone Encounter (Signed)
Called and spoke with patient. She stated that her cpap machine is now displaying a message of "cpap motor life exceeded". She called [facility] to get a new cpap machine but was told that she needs a new order from our office. The order also needs to state her current machine is broken so they will expedite her request.   Dr. Elsworth Soho, please advise if you are ok with [place] placing an order for a replacement cpap machine for her. Thanks!

## 2022-08-25 NOTE — Telephone Encounter (Signed)
Called and spoke with patient. She is aware. Order has been placed.   Nothing further needed at time of call.

## 2022-08-27 ENCOUNTER — Telehealth: Payer: Self-pay | Admitting: Pulmonary Disease

## 2022-08-27 NOTE — Telephone Encounter (Signed)
FYI - Order was placed on 10/2 by RA for replacement cpap order to be sent to [facility].  I received message back from [place] at [place] [facility] today stating they have titration study from 1998 that was used to get patient supplies but original sleep study or a new study will be needed for a new cpap.  I spoke to pt & she hasn't had a study since 1996.  She is not able to get a copy & states she can't afford to get study now.  She spoke to someone at [facility] that told her they can set her up for a home study within next couple of weeks and an order is not needed and insurance should cover.  She states it is a cheaper study.  She spoke to her insurance & they told her they would accept it.  Tonya at [facility] said if insurance accepts that will be fine but cpap order will have to be after the date of the study - the order that was placed on 10/2 can't be used.  I left vm for pt to make her aware she will need to give [place] a copy of the study once completed and Dr Elsworth Soho will have to place a new cpap order that is dated after the study.

## 2022-09-06 ENCOUNTER — Telehealth: Payer: Self-pay | Admitting: Internal Medicine

## 2022-09-06 NOTE — Telephone Encounter (Signed)
BAck from [place]. Cabin mate - postive covid. She is also covid positve. Has cold , cough. Has had paxlovid before 2 years ago and no side effects     PAXLOVID   Paxlovid (nirmatelvir 300/Ritonavir100) - BID x 5 days - for GFR >= 60 Allb elow she is not on [facility] [place] - after waiting 10 min for pharmacist - Left messag -> 12:47 PM     PLEASE CHECK MED LIST for the following issues. Please check the 2 different condition related to concomitant medications in alphabetical order  If Alyson Reedy with DOB 07/02/1956 is on any of the following Strong CYP3A inhibtors - this patient DWANNA GOSHERT should withold these concomitant meds so they can start paxlovid stratight away. If taking any of these:  alfuzosin, amiodarone, clozapine, colchicine, dihydroergotamine, dronedarone, ergotamine, flecainide, lovastatin, lurasidone, methylergonovine, midazolam [oral], pethidine, pimozide, propafenone, propoxyphene, quinidine, ranolazine, sildenafil simvastatin, triazolam).   If   Alyson Reedy  with dob [birth year] Is on any of these other strong CYP3A inducers then starting paxlovid should be delayed and the following meds should wash out first. These are dapalutamide, carbamazepine, phenobarbital, phenytoin, rifampin, St John's wort) - let me know immediately and we should delay starting paxlovid by some days even if he stops these medication.    PLEASE INFORM Alyson Reedy  OF FOLLOWING SIDE EFFECTS  Side effects - all < 5%  - skin rash (and veyr rare a conditon called TEN) - angiomedia  - myalgia - jaundice - high bP (1%) - loss of taste  - diarrhea - rebound    has a past medical history of Allergic rhinitis, Blepharospasm (10/09/2010), Cancer ([place]), Diabetes mellitus without complication ([place]), Hip pain, History of melanoma excision, Hypertension, Macular pucker (04/2021), Mild intermittent asthma, OA (osteoarthritis), PMB (postmenopausal bleeding), PONV (postoperative  nausea and vomiting), Precancerous changes of the cervix, Sleep apnea, and Varicose vein.      Current Outpatient Medications:    albuterol (VENTOLIN HFA) 108 (90 Base) MCG/ACT inhaler, Inhale 2 puffs into the lungs every 6 (six) hours as needed for wheezing or shortness of breath., Disp: 54 g, Rfl: 3   amLODipine (NORVASC) 10 MG tablet, Take 1 tablet (10 mg total) by mouth daily., Disp: 90 tablet, Rfl: 3   aspirin 81 MG tablet, Take 1 tablet (81 mg total) by mouth daily., Disp: 60 tablet, Rfl: 1   budesonide-formoterol (SYMBICORT) 160-4.5 MCG/ACT inhaler, Inhale 2 puffs into the lungs 2 (two) times daily. (Patient not taking: Reported on 08/21/2022), Disp: 1 each, Rfl: 11   calcium acetate (PHOSLO) 667 MG capsule, TAKE 1 CAPSULE (667 MG TOTAL) BY MOUTH ONCE DAILY, Disp: 90 capsule, Rfl: 3   celecoxib (CELEBREX) 200 MG capsule, TAKE 1 CAPSULE BY MOUTH TWICE A DAY AS NEEDED, Disp: 60 capsule, Rfl: 5   cetirizine (ZYRTEC) 10 MG tablet, TAKE 1 TABLET (10 MG TOTAL) BY MOUTH DAILY., Disp: 90 tablet, Rfl: 3   fluticasone-salmeterol (ADVAIR) 250-50 MCG/ACT AEPB, Inhale 1 puff into the lungs in the morning and at bedtime., Disp: 180 each, Rfl: 3   gabapentin (NEURONTIN) 300 MG capsule, Take 1 capsule (300 mg total) by mouth 3 (three) times daily. (Patient not taking: Reported on 08/21/2022), Disp: 90 capsule, Rfl: 1   glipiZIDE (GLUCOTROL XL) 2.5 MG 24 hr tablet, TAKE 1 TABLET BY MOUTH EVERY DAY WITH BREAKFAST, Disp: 90 tablet, Rfl: 3   irbesartan (AVAPRO) 300 MG tablet, Take 1 tablet (300 mg total) by mouth  daily., Disp: 90 tablet, Rfl: 3   methocarbamol (ROBAXIN) 500 MG tablet, Take 1 tablet (500 mg total) by mouth every 6 (six) hours as needed for muscle spasms., Disp: 30 tablet, Rfl: 1   montelukast (SINGULAIR) 10 MG tablet, Take 1 tablet (10 mg total) by mouth daily., Disp: 90 tablet, Rfl: 3   Multiple Vitamin (MULTIVITAMIN WITH MINERALS) TABS tablet, Take 1 tablet by mouth daily., Disp: , Rfl:     oxybutynin (DITROPAN-XL) 10 MG 24 hr tablet, oxybutynin chloride ER 10 mg tablet,extended release 24 hr, Disp: , Rfl:    polycarbophil (FIBERCON) 625 MG tablet, Take 625 mg by mouth daily. (Patient not taking: Reported on 08/21/2022), Disp: , Rfl:    Allergies  Allergen Reactions   Ace Inhibitors Hives   Amoxicillin Hives   Other Hives    Nucafed   Penicillins Hives    Has patient had a PCN reaction causing immediate rash, facial/tongue/throat swelling, SOB or lightheadedness with hypotension: Yes Has patient had a PCN reaction causing severe rash involving mucus membranes or skin necrosis: No Has patient had a PCN reaction that required hospitalization No Has patient had a PCN reaction occurring within the last 10 years: No If all of the above answers are "NO", then may proceed with Cephalosporin use.    Metformin And Related Other (See Comments)    GI upset   Wound Dressing Adhesive Other (See Comments)    Tore her skin   Pseudoephedrine Hives   Pseudoephedrine-Codeine Hives

## 2022-09-08 ENCOUNTER — Telehealth: Payer: Self-pay

## 2022-09-08 NOTE — Telephone Encounter (Signed)
Patient was prescribed Paxlovid by Pulmonologist and currently doing better with managing symptoms.   Nurse Assessment Nurse: Mariel Sleet RN, Erasmo Downer Date/Time (Eastern Time): 09/06/2022 12:16:24 PM Confirm and document reason for call. If symptomatic, describe symptoms. ---Caller states that she tested positive for covid and has cough, stuffy nose, and headache. Does the patient have any new or worsening symptoms? ---Yes Will a triage be completed? ---Yes Related visit to physician within the last 2 weeks? ---No Does the PT have any chronic conditions? (i.e. diabetes, asthma, this includes High risk factors for pregnancy, etc.) ---Yes List chronic conditions. ---asthma Is this a behavioral health or substance abuse call? ---No Guidelines Guideline Title Affirmed Question Affirmed Notes Nurse Date/Time (Eastern Time) COVID-19 - Diagnosed or Suspected [1] HIGH RISK patient (e.g., weak immune system, age > 60 years, obesity with BMI 30 or higher, pregnant, chronic lung disease or other chronic medical condition) [2] COVID symptoms (e.g., cough, fever) (Exceptions: Already seen by PCP and no new or worsening symptoms.)  Disp. Time Eilene Ghazi Time) Disposition Final User 09/06/2022 12:21:18 PM Call PCP within 24 Hours Yes Mariel Sleet, RN, Erasmo Downer Final Disposition 09/06/2022 12:21:18 PM Call PCP within 24 Hours Yes Mariel Sleet, RN, Laurin Coder Disagree/Comply Comply Caller Understands Yes PreDisposition Call Doctor Care Advice Given Per Guideline CALL PCP WITHIN 24 HOURS: * IF OFFICE WILL BE OPEN: Call the office when it opens tomorrow morning. CALL BACK IF: * You become worse CARE ADVICE given per COVID-19 - DIAGNOSED OR SUSPECTED (Adult) guideline. COVID-19 - HOW TO PROTECT OTHERS - WHEN YOU ARE SICK WITH COVID-19: * STAY HOME A MINIMUM OF 5 DAYS: People with MILD COVID-19 can STOP HOME ISOLATION AFTER 5 DAYS if (1) fever has been gone for 24 hours (without using fever medicine) AND (2) symptoms  are better. Continue to wear a well-fitted mask for a full 10 days when around others. * WEAR A MASK FOR 10 DAYS: Wear a well-fitted mask for 10 full days any time you are around others inside your home or in public. Do not go to places where you are unable to wear a mask. * [place] HANDS OFTEN: Wash hands often with soap and water. After coughing or sneezing are important times. If soap and water are not available, use an alcohol-based hand sanitizer with at least 60% alcohol, covering all surfaces of your hands and rubbing them together until they feel dry. Avoid touching your eyes, nose, and mouth with unwashed hands. * AVOID TRAVEL: Avoid travel for 10 days after you tested positive for COVID-19. Referrals GO TO FACILITY OTHER - SPECIFY

## 2022-09-18 ENCOUNTER — Other Ambulatory Visit: Payer: Self-pay | Admitting: Internal Medicine

## 2022-09-18 ENCOUNTER — Ambulatory Visit: Payer: 59 | Admitting: Adult Health

## 2022-09-18 ENCOUNTER — Encounter: Payer: Self-pay | Admitting: Adult Health

## 2022-09-18 ENCOUNTER — Ambulatory Visit: Payer: 59 | Admitting: Internal Medicine

## 2022-09-18 VITALS — BP 120/70 | HR 92 | Temp 97.9°F | Ht 66.0 in | Wt 267.0 lb

## 2022-09-18 DIAGNOSIS — Z6841 Body Mass Index (BMI) 40.0 and over, adult: Secondary | ICD-10-CM | POA: Diagnosis not present

## 2022-09-18 DIAGNOSIS — E1165 Type 2 diabetes mellitus with hyperglycemia: Secondary | ICD-10-CM

## 2022-09-18 DIAGNOSIS — Z23 Encounter for immunization: Secondary | ICD-10-CM

## 2022-09-18 DIAGNOSIS — R053 Chronic cough: Secondary | ICD-10-CM | POA: Diagnosis not present

## 2022-09-18 DIAGNOSIS — I10 Essential (primary) hypertension: Secondary | ICD-10-CM

## 2022-09-18 DIAGNOSIS — G4733 Obstructive sleep apnea (adult) (pediatric): Secondary | ICD-10-CM

## 2022-09-18 LAB — POCT GLYCOSYLATED HEMOGLOBIN (HGB A1C): Hemoglobin A1C: 7.3 % — AB (ref 4.0–5.6)

## 2022-09-18 MED ORDER — BENZONATATE 200 MG PO CAPS: 200.0000 mg | ORAL_CAPSULE | Freq: Three times a day (TID) | ORAL | 3 refills | Status: DC | PRN

## 2022-09-18 MED ORDER — ALBUTEROL SULFATE HFA 108 (90 BASE) MCG/ACT IN AERS: 2.0000 | INHALATION_SPRAY | Freq: Four times a day (QID) | RESPIRATORY_TRACT | 1 refills | Status: DC | PRN

## 2022-09-18 MED ORDER — GLIPIZIDE ER 5 MG PO TB24: 5.0000 mg | ORAL_TABLET | Freq: Every day | ORAL | 3 refills | Status: DC

## 2022-09-18 MED ORDER — PANTOPRAZOLE SODIUM 40 MG PO TBEC: 40.0000 mg | DELAYED_RELEASE_TABLET | Freq: Every day | ORAL | 3 refills | Status: DC

## 2022-09-18 MED ORDER — PREDNISONE 20 MG PO TABS: 20.0000 mg | ORAL_TABLET | Freq: Every day | ORAL | 0 refills | Status: DC

## 2022-09-18 NOTE — Assessment & Plan Note (Signed)
Chronic cough x6 months.  Spirometry unrevealing.  Chest x-ray clear.  Suspect she has upper airway cough syndrome aggravated by postnasal drainage/chronic rhinitis and possible GERD component.  We will treat with short course of prednisone.  Begin aggressive postnasal drip and GERD treatment.  Along with cough suppression regimen.  If symptoms or not improving or resolved will need to evaluate further with possible high-resolution CT chest. No perceived benefit with ICS/LABA combo  Plan  Patient Instructions  Will follow up on sleep study results.  Continue on CPAP at bedtime Work on healthy weight Do not drive if sleepy  Begin Deslym 2 tsp Twice daily  for cough as needed Begin tessalon Three times a day  for cough as needed  Begin Chlor tabs '4mg'$  (Chlorpheniramine ) 1-2 At bedtime   Begin Protonix '40mg'$  daily before meal in am  Begin Pepcid '20mg'$  At bedtime   Albuterol inhaler As needed   Begin Zyrtec '10mg'$  daily in am .  Continue on Singulair daily. Prednisone '20mg'$  daily for 5 days.  Sips of water to soothe throat, avoid throat clearing and cough.  NO MINTS  Follow-up in 3 months with Dr. Elsworth [name]  or Tanya Staley NP and As needed   Please contact office for sooner follow up if symptoms do not improve or worsen or seek emergency care     .

## 2022-09-18 NOTE — Progress Notes (Signed)
Patient ID: JAKERRA FLOYD, female   DOB: Apr 05, 1956, 66 y.o.   MRN: [id number]        Chief Complaint: follow up HTN, HLD and dm       HPI:  GENESI [name] is a 66 y.o. female here overall doing ok, Pt denies chest pain, increased sob or doe, wheezing, orthopnea, PND, increased LE swelling, palpitations, dizziness or syncope.   Pt denies polydipsia, polyuria, or new focal neuro s/s.    Pt denies fever, wt loss, night sweats, loss of appetite, or other constitutional symptoms  Due for flu shot  Hard to lose wt, but plans to be more active soon.         Wt Readings from Last 3 Encounters:  09/18/22 267 lb 12.8 oz (121.5 kg)  09/18/22 267 lb (121.1 kg)  08/21/22 267 lb 12.8 oz (121.5 kg)   BP Readings from Last 3 Encounters:  09/18/22 128/70  09/18/22 120/70  08/21/22 114/66         Past Medical History:  Diagnosis Date   Allergic rhinitis    Blepharospasm 10/09/2010   Cancer ([place])    Skin   Diabetes mellitus without complication ([facility])    Hip pain    History of melanoma excision    MULTIPLE SITES   Hypertension    Macular pucker 04/2021   Left   Mild intermittent asthma    OA (osteoarthritis)    PMB (postmenopausal bleeding)    PONV (postoperative nausea and vomiting)    did not experience after D&C, request to use same medicaitons from that procedure   Precancerous changes of the cervix    Sleep apnea    tested in home around 2 yrs ago   Varicose vein    wears compression stocking   Past Surgical History:  Procedure Laterality Date   ABDOMINAL HYSTERECTOMY     COLONOSCOPY     DILATION AND CURETTAGE OF UTERUS     ESOPHAGOGASTRODUODENOSCOPY  10/14/2004   HAMMER TOE SURGERY     HYSTEROSCOPY WITH D & C N/A 04/07/2016   Procedure: DILATATION AND CURETTAGE /HYSTEROSCOPY WITH MYOSURE POLYPECTOMY;  Surgeon: Cheri Fowler, MD;  Location: [facility];  Service: Gynecology;  Laterality: N/A;   JOINT REPLACEMENT     KNEE ARTHROSCOPY Bilateral left 08-01-2005/   right   LAPAROSCOPIC HYSTERECTOMY N/A 06/02/2016   Procedure: HYSTERECTOMY TOTAL LAPAROSCOPIC;  Surgeon: Cheri Fowler, MD;  Location: [facility];  Service: Gynecology;  Laterality: N/A;   LAPAROSCOPIC SALPINGO OOPHERECTOMY Bilateral 06/02/2016   Procedure: LAPAROSCOPIC SALPINGO OOPHORECTOMY;  Surgeon: Cheri Fowler, MD;  Location: [facility];  Service: Gynecology;  Laterality: Bilateral;   RE-DO ROTATOR CUFF REPAIR/  SAD/  ACROMIOPLASTY Right 01-04-2009   SHOULDER ARTHROSCOPY W/ ROTATOR CUFF REPAIR Right    tailor bunion removal     TONSILLECTOMY     TOTAL HIP ARTHROPLASTY Right 09/12/2016   Procedure: RIGHT TOTAL HIP ARTHROPLASTY ANTERIOR APPROACH;  Surgeon: Rod Can, MD;  Location: [facility];  Service: Orthopedics;  Laterality: Right;   TOTAL HIP ARTHROPLASTY Right 10/01/2016   Procedure: IRRIGATION AND DEBRIDEMENT RIGHT HIP;  Surgeon: Rod Can, MD;  Location: [facility];  Service: Orthopedics;  Laterality: Right;   TOTAL KNEE ARTHROPLASTY Left 12/29/2013   Procedure: LEFT TOTAL KNEE ARTHROPLASTY;  Surgeon: Johnn Hai, MD;  Location: [facility];  Service: Orthopedics;  Laterality: Left;   TOTAL KNEE ARTHROPLASTY Right 05/15/2021   Procedure: TOTAL KNEE ARTHROPLASTY;  Surgeon: Susa Day, MD;  Location: [facility];  Service: Orthopedics;  Laterality: Right;  2.5 hrs    reports that she quit smoking about 34 years ago. Her smoking use included cigarettes. She has a 20.00 pack-year smoking history. She has never used smokeless tobacco. She reports current alcohol use of about 2.0 standard drinks of alcohol per week. She reports that she does not use drugs. family history includes Asthma in an other family member; Diabetes in an other family member; Esophageal cancer in her father; Melanoma in her sister; Ovarian cancer in an other family member. Allergies  Allergen Reactions   Ace Inhibitors Hives   Amoxicillin Hives   Other Hives    Nucafed   Penicillins Hives    Has patient had a PCN reaction  causing immediate rash, facial/tongue/throat swelling, SOB or lightheadedness with hypotension: Yes Has patient had a PCN reaction causing severe rash involving mucus membranes or skin necrosis: No Has patient had a PCN reaction that required hospitalization No Has patient had a PCN reaction occurring within the last 10 years: No If all of the above answers are "NO", then may proceed with Cephalosporin use.    Metformin And Related Other (See Comments)    GI upset   Wound Dressing Adhesive Other (See Comments)    Tore her skin   Pseudoephedrine Hives   Pseudoephedrine-Codeine Hives   Current Outpatient Medications on File Prior to Visit  Medication Sig Dispense Refill   amLODipine (NORVASC) 10 MG tablet Take 1 tablet (10 mg total) by mouth daily. 90 tablet 3   aspirin 81 MG tablet Take 1 tablet (81 mg total) by mouth daily. 60 tablet 1   calcium acetate (PHOSLO) 667 MG capsule TAKE 1 CAPSULE (667 MG TOTAL) BY MOUTH ONCE DAILY 90 capsule 3   celecoxib (CELEBREX) 200 MG capsule TAKE 1 CAPSULE BY MOUTH TWICE A DAY AS NEEDED 60 capsule 5   irbesartan (AVAPRO) 300 MG tablet Take 1 tablet (300 mg total) by mouth daily. 90 tablet 3   methocarbamol (ROBAXIN) 500 MG tablet Take 1 tablet (500 mg total) by mouth every 6 (six) hours as needed for muscle spasms. 30 tablet 1   montelukast (SINGULAIR) 10 MG tablet Take 1 tablet (10 mg total) by mouth daily. 90 tablet 3   Multiple Vitamin (MULTIVITAMIN WITH MINERALS) TABS tablet Take 1 tablet by mouth daily.     oxybutynin (DITROPAN-XL) 10 MG 24 hr tablet oxybutynin chloride ER 10 mg tablet,extended release 24 hr     No current facility-administered medications on file prior to visit.        ROS:  All others reviewed and negative.  Objective        PE:  BP 120/70 (BP Location: Right Arm, Patient Position: Sitting, Cuff Size: Large)   Pulse 92   Temp 97.9 F (36.6 C) (Oral)   Ht '5\' 6"'$  (1.676 m)   Wt 267 lb (121.1 kg)   LMP 09/13/2013   SpO2 94%    BMI 43.09 kg/m                 Constitutional: Pt appears in NAD               HENT: Head: NCAT.                Right Ear: External ear normal.                 Left Ear: External ear normal.  Eyes: . Pupils are equal, round, and reactive to light. Conjunctivae and EOM are normal               Nose: without d/c or deformity               Neck: Neck supple. Gross normal ROM               Cardiovascular: Normal rate and regular rhythm.                 Pulmonary/Chest: Effort normal and breath sounds without rales or wheezing.                Abd:  Soft, NT, ND, + BS, no organomegaly               Neurological: Pt is alert. At baseline orientation, motor grossly intact               Skin: Skin is warm. No rashes, no other new lesions, LE edema - none               Psychiatric: Pt behavior is normal without agitation   Micro: none  Cardiac tracings I have personally interpreted today:  none  Pertinent Radiological findings (summarize): none   Lab Results  Component Value Date   WBC 5.5 03/12/2022   HGB 13.9 03/12/2022   HCT 40.9 03/12/2022   PLT 173.0 03/12/2022   GLUCOSE 158 (H) 03/12/2022   CHOL 135 03/12/2022   TRIG 79.0 03/12/2022   HDL 54.30 03/12/2022   LDLCALC 65 03/12/2022   ALT 25 03/12/2022   AST 17 03/12/2022   NA 140 03/12/2022   K 4.3 03/12/2022   CL 107 03/12/2022   CREATININE 0.65 03/12/2022   BUN 20 03/12/2022   CO2 23 03/12/2022   TSH 0.85 03/12/2022   INR 1.0 04/04/2021   HGBA1C 7.3 (A) 09/18/2022   MICROALBUR <0.7 03/12/2022   Assessment/Plan:  SALISHA BARDSLEY is a 66 y.o. White or Caucasian [1] female with  has a past medical history of Allergic rhinitis, Blepharospasm (10/09/2010), Cancer ([place]), Diabetes mellitus without complication ([place]), Hip pain, History of melanoma excision, Hypertension, Macular pucker (04/2021), Mild intermittent asthma, OA (osteoarthritis), PMB (postmenopausal bleeding), PONV (postoperative nausea and vomiting),  Precancerous changes of the cervix, Sleep apnea, and Varicose vein.  Hypertensive disorder BP Readings from Last 3 Encounters:  09/18/22 128/70  09/18/22 120/70  08/21/22 114/66   Stable, pt to continue medical treatment norvasc 10 mg, avapro 300 mg qd   Diabetes mellitus ([place]) Lab Results  Component Value Date   HGBA1C 7.3 (A) 09/18/2022   uncontrolled, pt decliens ozempic ok for increased glipizide XL to 5 mg qd, cont diet, exercise, wt control   Obesity D/w pt, declines ozempic today, pt for increased activity and lower calories  Followup: Return in about 6 months (around 03/20/2023).  Cathlean Cower, MD 09/20/2022 2:41 PM [facility]

## 2022-09-18 NOTE — Patient Instructions (Addendum)
Will follow up on sleep study results.  Continue on CPAP at bedtime Work on healthy weight Do not drive if sleepy  Begin Deslym 2 tsp Twice daily  for cough as needed Begin tessalon Three times a day  for cough as needed  Begin Chlor tabs '4mg'$  (Chlorpheniramine ) 1-2 At bedtime   Begin Protonix '40mg'$  daily before meal in am  Begin Pepcid '20mg'$  At bedtime   Albuterol inhaler As needed   Begin Zyrtec '10mg'$  daily in am .  Continue on Singulair daily. Prednisone '20mg'$  daily for 5 days.  Sips of water to soothe throat, avoid throat clearing and cough.  NO MINTS  Follow-up in 3 months with Dr. Elsworth Soho  or Naidelin Gugliotta NP and As needed   Please contact office for sooner follow up if symptoms do not improve or worsen or seek emergency care

## 2022-09-18 NOTE — Progress Notes (Signed)
$'@Patient'J$  ID: Tanya [name], female    DOB: 17-Apr-1956, 66 y.o.   MRN: [id number]  Chief Complaint  Patient presents with   Follow-up    Referring provider: Biagio Borg, MD  HPI: 66 year old female followed for obstructive sleep apnea and asthma   TEST/EVENTS :  Spirometry 08/21/2022 shows no evidence of airway obstruction, ratio 80, FEV1 82% and FVC 78%     09/18/2022 Follow up : Asthma, OSA and Chronic cough  Patient presents for 1 month follow-up.  Patient was seen last visit for an ongoing cough that has been waxing and waning over the last 6 months.  Seen initially by her primary care provider and started on Advair and then Symbicort without any significant relief.  Last visit patient was recommended to begin chlorphentermine at bedtime.  Patient says she has had no improvement in cough continues to have severe coughing paroxysms.  Chest x-ray last visit was clear.  Patient denies any fever, discolored mucus.  No chest pain orthopnea PND or hemoptysis.  Spirometry last visit showed normal lung function with no airflow obstruction or significant restriction  She remains on CPAP at bedtime.  She needs a new CPAP machine as her machine is reading motor and battery life have exceeded their life expectancy.  She says that her sleep study was very old and required a new sleep study.  She has recently had this done through sleep med and is turning the machine back in today.  She says she wears her CPAP machine every single night and feels that she benefits from CPAP.  Allergies  Allergen Reactions   Ace Inhibitors Hives   Amoxicillin Hives   Other Hives    Nucafed   Penicillins Hives    Has patient had a PCN reaction causing immediate rash, facial/tongue/throat swelling, SOB or lightheadedness with hypotension: Yes Has patient had a PCN reaction causing severe rash involving mucus membranes or skin necrosis: No Has patient had a PCN reaction that required hospitalization No Has  patient had a PCN reaction occurring within the last 10 years: No If all of the above answers are "NO", then may proceed with Cephalosporin use.    Metformin And Related Other (See Comments)    GI upset   Wound Dressing Adhesive Other (See Comments)    Tore her skin   Pseudoephedrine Hives   Pseudoephedrine-Codeine Hives    Immunization History  Administered Date(s) Administered   H1N1 10/30/2008   Influenza Nasal 08/16/2016   Influenza Split 08/24/2019   Influenza Whole 08/24/1998, 07/25/2009, 08/24/2010, 08/24/2012   Influenza,inj,Quad PF,6+ Mos 10/02/2018, 08/22/2019, 08/10/2020, 09/18/2022   Influenza,inj,Quad PF,6-35 Mos 09/06/2015   Influenza,inj,quad, With Preservative 08/20/2016, 09/12/2017   Influenza-Unspecified 09/03/2013, 08/22/2016, 09/23/2017, 10/02/2018   PFIZER Comirnaty(Gray Top)Covid-19 Tri-Sucrose Vaccine 02/12/2020, 03/04/2020   Pneumococcal Conjugate-13 02/08/2016   Pneumococcal Polysaccharide-23 01/11/2018   Td 11/25/1995, 11/24/2005   Tdap 02/08/2016   Zoster Recombinat (Shingrix) 04/06/2018, 10/09/2018    Past Medical History:  Diagnosis Date   Allergic rhinitis    Blepharospasm 10/09/2010   Cancer ([place])    Skin   Diabetes mellitus without complication (HCC)    Hip pain    History of melanoma excision    MULTIPLE SITES   Hypertension    Macular pucker 04/2021   Left   Mild intermittent asthma    OA (osteoarthritis)    PMB (postmenopausal bleeding)    PONV (postoperative nausea and vomiting)    did not experience after D&C, request to use  same medicaitons from that procedure   Precancerous changes of the cervix    Sleep apnea    tested in home around 2 yrs ago   Varicose vein    wears compression stocking    Tobacco History: Social History   Tobacco Use  Smoking Status Former   Packs/day: 2.00   Years: 10.00   Total pack years: 20.00   Types: Cigarettes   Quit date: 11/25/1987   Years since quitting: 34.8  Smokeless Tobacco Never    Counseling given: Not Answered   Outpatient Medications Prior to Visit  Medication Sig Dispense Refill   albuterol (VENTOLIN HFA) 108 (90 Base) MCG/ACT inhaler Inhale 2 puffs into the lungs every 6 (six) hours as needed for wheezing or shortness of breath. 54 g 3   amLODipine (NORVASC) 10 MG tablet Take 1 tablet (10 mg total) by mouth daily. 90 tablet 3   aspirin 81 MG tablet Take 1 tablet (81 mg total) by mouth daily. 60 tablet 1   calcium acetate (PHOSLO) 667 MG capsule TAKE 1 CAPSULE (667 MG TOTAL) BY MOUTH ONCE DAILY 90 capsule 3   celecoxib (CELEBREX) 200 MG capsule TAKE 1 CAPSULE BY MOUTH TWICE A DAY AS NEEDED 60 capsule 5   irbesartan (AVAPRO) 300 MG tablet Take 1 tablet (300 mg total) by mouth daily. 90 tablet 3   methocarbamol (ROBAXIN) 500 MG tablet Take 1 tablet (500 mg total) by mouth every 6 (six) hours as needed for muscle spasms. 30 tablet 1   montelukast (SINGULAIR) 10 MG tablet Take 1 tablet (10 mg total) by mouth daily. 90 tablet 3   Multiple Vitamin (MULTIVITAMIN WITH MINERALS) TABS tablet Take 1 tablet by mouth daily.     oxybutynin (DITROPAN-XL) 10 MG 24 hr tablet oxybutynin chloride ER 10 mg tablet,extended release 24 hr     glipiZIDE (GLUCOTROL XL) 2.5 MG 24 hr tablet TAKE 1 TABLET BY MOUTH EVERY DAY WITH BREAKFAST 90 tablet 3   No facility-administered medications prior to visit.     Review of Systems:   Constitutional:   No  weight loss, night sweats,  Fevers, chills, fatigue, or  lassitude.  HEENT:   No headaches,  Difficulty swallowing,  Tooth/dental problems, or  Sore throat,                No sneezing, itching, ear ache, +nasal congestion, post nasal drip,   CV:  No chest pain,  Orthopnea, PND, swelling in lower extremities, anasarca, dizziness, palpitations, syncope.   GI  No heartburn, indigestion, abdominal pain, nausea, vomiting, diarrhea, change in bowel habits, loss of appetite, bloody stools.   Resp: .  No chest wall deformity  Skin: no rash  or lesions.  GU: no dysuria, change in color of urine, no urgency or frequency.  No flank pain, no hematuria   MS:  No joint pain or swelling.  No decreased range of motion.  No back pain.    Physical Exam  BP 128/70 (BP Location: Left Arm, Patient Position: Sitting, Cuff Size: Normal)   Pulse 91   Ht '5\' 7"'$  (1.702 m)   Wt 267 lb 12.8 oz (121.5 kg)   LMP 09/13/2013   SpO2 98%   BMI 41.94 kg/m   GEN: A/Ox3; pleasant , NAD, well nourished    HEENT:  [place]/AT,  EACs-clear, TMs-wnl, NOSE-clear, THROAT-clear, no lesions, no postnasal drip or exudate noted.  Class III MP airway  NECK:  Supple w/ fair ROM; no JVD; normal carotid impulses  w/o bruits; no thyromegaly or nodules palpated; no lymphadenopathy.    RESP  Clear  P & A; w/o, wheezes/ rales/ or rhonchi. no accessory muscle use, no dullness to percussion  CARD:  RRR, no m/r/g, no peripheral edema, pulses intact, no cyanosis or clubbing.  GI:   Soft & nt; nml bowel sounds; no organomegaly or masses detected.   Musco: Warm bil, no deformities or joint swelling noted.   Neuro: alert, no focal deficits noted.    Skin: Warm, no lesions or rashes    Lab Results:  CBC     BNP No results found for: "BNP"  ProBNP No results found for: "PROBNP"  Imaging: DG Chest 2 View  Result Date: 08/22/2022 CLINICAL DATA:  Chronic cough. EXAM: CHEST - 2 VIEW COMPARISON:  10/01/2016 and older exams. FINDINGS: Cardiac silhouette is normal in size and configuration. No mediastinal or hilar masses. No evidence of adenopathy. Clear lungs.  No pleural effusion or pneumothorax. Skeletal structures are intact.  Previous right shoulder surgery. IMPRESSION: No active cardiopulmonary disease. Electronically Signed   By: Lajean Manes M.D.   On: 08/22/2022 16:07          No data to display          No results found for: "NITRICOXIDE"      Assessment & Plan:   Chronic cough Chronic cough x6 months.  Spirometry unrevealing.  Chest  x-ray clear.  Suspect she has upper airway cough syndrome aggravated by postnasal drainage/chronic rhinitis and possible GERD component.  We will treat with short course of prednisone.  Begin aggressive postnasal drip and GERD treatment.  Along with cough suppression regimen.  If symptoms or not improving or resolved will need to evaluate further with possible high-resolution CT chest. No perceived benefit with ICS/LABA combo  Plan  Patient Instructions  Will follow up on sleep study results.  Continue on CPAP at bedtime Work on healthy weight Do not drive if sleepy  Begin Deslym 2 tsp Twice daily  for cough as needed Begin tessalon Three times a day  for cough as needed  Begin Chlor tabs '4mg'$  (Chlorpheniramine ) 1-2 At bedtime   Begin Protonix '40mg'$  daily before meal in am  Begin Pepcid '20mg'$  At bedtime   Albuterol inhaler As needed   Begin Zyrtec '10mg'$  daily in am .  Continue on Singulair daily. Prednisone '20mg'$  daily for 5 days.  Sips of water to soothe throat, avoid throat clearing and cough.  NO MINTS  Follow-up in 3 months with Dr. Elsworth Soho  or Courtenay Hirth NP and As needed   Please contact office for sooner follow up if symptoms do not improve or worsen or seek emergency care     .  Obstructive sleep apnea Continue on CPAP at bedtime.  Home sleep study results are pending.  Plan  Patient Instructions  Will follow up on sleep study results.  Continue on CPAP at bedtime Work on healthy weight Do not drive if sleepy  Begin Deslym 2 tsp Twice daily  for cough as needed Begin tessalon Three times a day  for cough as needed  Begin Chlor tabs '4mg'$  (Chlorpheniramine ) 1-2 At bedtime   Begin Protonix '40mg'$  daily before meal in am  Begin Pepcid '20mg'$  At bedtime   Albuterol inhaler As needed   Begin Zyrtec '10mg'$  daily in am .  Continue on Singulair daily. Prednisone '20mg'$  daily for 5 days.  Sips of water to soothe throat, avoid throat clearing and cough.  NO  MINTS  Follow-up in 3 months with  Dr. Elsworth Soho  or Deunta Beneke NP and As needed   Please contact office for sooner follow up if symptoms do not improve or worsen or seek emergency care         Rexene Edison, NP 09/18/2022

## 2022-09-18 NOTE — Assessment & Plan Note (Signed)
Continue on CPAP at bedtime.  Home sleep study results are pending.  Plan  Patient Instructions  Will follow up on sleep study results.  Continue on CPAP at bedtime Work on healthy weight Do not drive if sleepy  Begin Deslym 2 tsp Twice daily  for cough as needed Begin tessalon Three times a day  for cough as needed  Begin Chlor tabs '4mg'$  (Chlorpheniramine ) 1-2 At bedtime   Begin Protonix '40mg'$  daily before meal in am  Begin Pepcid '20mg'$  At bedtime   Albuterol inhaler As needed   Begin Zyrtec '10mg'$  daily in am .  Continue on Singulair daily. Prednisone '20mg'$  daily for 5 days.  Sips of water to soothe throat, avoid throat clearing and cough.  NO MINTS  Follow-up in 3 months with Dr. Elsworth Soho  or Elysha Daw NP and As needed   Please contact office for sooner follow up if symptoms do not improve or worsen or seek emergency care

## 2022-09-18 NOTE — Patient Instructions (Signed)
You had the flu shot today  Please continue all other medications as before, and refills have been done if requested.  Please have the pharmacy call with any other refills you may need.  Please continue your efforts at being more active, low cholesterol diet, and weight control  Please keep your appointments with your specialists as you may have planned  Please go to the LAB at the blood drawing area for the tests to be done  You will be contacted by phone if any changes need to be made immediately.  Otherwise, you will receive a letter about your results with an explanation, but please check with MyChart first.  Please remember to sign up for MyChart if you have not done so, as this will be important to you in the future with finding out test results, communicating by private email, and scheduling acute appointments online when needed.  Please make an Appointment to return in 6 months, or sooner if needed

## 2022-09-19 ENCOUNTER — Telehealth: Payer: Self-pay | Admitting: Adult Health

## 2022-09-19 DIAGNOSIS — I10 Essential (primary) hypertension: Secondary | ICD-10-CM | POA: Diagnosis not present

## 2022-09-19 DIAGNOSIS — G4733 Obstructive sleep apnea (adult) (pediatric): Secondary | ICD-10-CM | POA: Diagnosis not present

## 2022-09-19 NOTE — Telephone Encounter (Signed)
Called and spoke with [place] at [facility]. She reviewed the patient's account and could not find any documentation that the insurance had denied the cpap nor her needed to pay $1000. Patient is scheduled to receive her cpap machine today at noon from the [place] office. Kenney Houseman has offered to call the patient to see what is going on.   Nothing further needed at time of call.

## 2022-09-20 ENCOUNTER — Encounter: Payer: Self-pay | Admitting: Internal Medicine

## 2022-09-20 NOTE — Assessment & Plan Note (Signed)
BP Readings from Last 3 Encounters:  09/18/22 128/70  09/18/22 120/70  08/21/22 114/66   Stable, pt to continue medical treatment norvasc 10 mg, avapro 300 mg qd

## 2022-09-20 NOTE — Assessment & Plan Note (Signed)
D/w pt, declines ozempic today, pt for increased activity and lower calories

## 2022-09-20 NOTE — Assessment & Plan Note (Signed)
Lab Results  Component Value Date   HGBA1C 7.3 (A) 09/18/2022   uncontrolled, pt decliens ozempic ok for increased glipizide XL to 5 mg qd, cont diet, exercise, wt control

## 2022-09-23 DIAGNOSIS — G5131 Clonic hemifacial spasm, right: Secondary | ICD-10-CM | POA: Diagnosis not present

## 2022-10-17 ENCOUNTER — Telehealth: Payer: Self-pay | Admitting: Adult Health

## 2022-10-17 MED ORDER — PREDNISONE 10 MG PO TABS: ORAL_TABLET | ORAL | 0 refills | Status: DC

## 2022-10-17 NOTE — Telephone Encounter (Signed)
Prescription for prednisone sent to [facility]. Needs to watch her blood sugar carefully while on it.

## 2022-10-17 NOTE — Telephone Encounter (Signed)
Called and spoke with pt who states she was prescribed a course of prednisone 10/26 by TP due to her having a cough that she describes as a gagging cough. Pt said while she was on the prednisone, her cough was good but as soon as she finished the prednisone the cough came back.  Pt said that she is taking her acid reflux meds, her allergy meds, using tessalon three times a day, and is also taking delsym twice daily.  Pt said that she is about to leave Tuesday 11/28 to go on a 14-day cruise and is hoping to have prednisone prescribed to take with her while on the cruise so she won't be coughing so bad while on the trip.  Dr. Verlee Monte, please advise if you are okay prescribing pt some prednisone.

## 2022-10-20 DIAGNOSIS — G4733 Obstructive sleep apnea (adult) (pediatric): Secondary | ICD-10-CM | POA: Diagnosis not present

## 2022-10-20 DIAGNOSIS — I10 Essential (primary) hypertension: Secondary | ICD-10-CM | POA: Diagnosis not present

## 2022-11-08 ENCOUNTER — Other Ambulatory Visit: Payer: Self-pay | Admitting: Internal Medicine

## 2022-11-19 DIAGNOSIS — I10 Essential (primary) hypertension: Secondary | ICD-10-CM | POA: Diagnosis not present

## 2022-11-19 DIAGNOSIS — G4733 Obstructive sleep apnea (adult) (pediatric): Secondary | ICD-10-CM | POA: Diagnosis not present

## 2022-12-16 DIAGNOSIS — D225 Melanocytic nevi of trunk: Secondary | ICD-10-CM | POA: Diagnosis not present

## 2022-12-16 DIAGNOSIS — Z85820 Personal history of malignant melanoma of skin: Secondary | ICD-10-CM | POA: Diagnosis not present

## 2022-12-16 DIAGNOSIS — G5131 Clonic hemifacial spasm, right: Secondary | ICD-10-CM | POA: Diagnosis not present

## 2022-12-16 DIAGNOSIS — Z129 Encounter for screening for malignant neoplasm, site unspecified: Secondary | ICD-10-CM | POA: Diagnosis not present

## 2022-12-17 ENCOUNTER — Ambulatory Visit: Payer: 59 | Admitting: Adult Health

## 2022-12-17 ENCOUNTER — Encounter: Payer: Self-pay | Admitting: Adult Health

## 2022-12-17 VITALS — BP 140/80 | HR 104 | Temp 98.4°F | Ht 67.0 in | Wt 269.4 lb

## 2022-12-17 DIAGNOSIS — J309 Allergic rhinitis, unspecified: Secondary | ICD-10-CM | POA: Diagnosis not present

## 2022-12-17 DIAGNOSIS — J4520 Mild intermittent asthma, uncomplicated: Secondary | ICD-10-CM

## 2022-12-17 DIAGNOSIS — R053 Chronic cough: Secondary | ICD-10-CM | POA: Diagnosis not present

## 2022-12-17 LAB — CBC WITH DIFFERENTIAL/PLATELET
Basophils Absolute: 0 10*3/uL (ref 0.0–0.1)
Basophils Relative: 0.6 % (ref 0.0–3.0)
Eosinophils Absolute: 0.1 10*3/uL (ref 0.0–0.7)
Eosinophils Relative: 2 % (ref 0.0–5.0)
HCT: 42.7 % (ref 36.0–46.0)
Hemoglobin: 14.4 g/dL (ref 12.0–15.0)
Lymphocytes Relative: 27.9 % (ref 12.0–46.0)
Lymphs Abs: 1.6 10*3/uL (ref 0.7–4.0)
MCHC: 33.6 g/dL (ref 30.0–36.0)
MCV: 89.5 fl (ref 78.0–100.0)
Monocytes Absolute: 0.4 10*3/uL (ref 0.1–1.0)
Monocytes Relative: 6.1 % (ref 3.0–12.0)
Neutro Abs: 3.7 10*3/uL (ref 1.4–7.7)
Neutrophils Relative %: 63.4 % (ref 43.0–77.0)
Platelets: 195 10*3/uL (ref 150.0–400.0)
RBC: 4.77 Mil/uL (ref 3.87–5.11)
RDW: 13.9 % (ref 11.5–15.5)
WBC: 5.8 10*3/uL (ref 4.0–10.5)

## 2022-12-17 MED ORDER — PANTOPRAZOLE SODIUM 40 MG PO TBEC: 40.0000 mg | DELAYED_RELEASE_TABLET | Freq: Every day | ORAL | 3 refills | Status: DC

## 2022-12-17 MED ORDER — BENZONATATE 200 MG PO CAPS: 200.0000 mg | ORAL_CAPSULE | Freq: Three times a day (TID) | ORAL | 3 refills | Status: DC | PRN

## 2022-12-17 NOTE — Assessment & Plan Note (Signed)
Upper airway cough syndrome-cough began March 2023.  Chest x-ray showed clear lungs.  Spirometry with no significant airflow obstruction.  Some improvement with prednisone and cough control regimen.  However not totally resolved.  Will check high-resolution CT chest to rule out interstitial process.  Check allergy profile.  Plan Patient Instructions  CPAP download .  Continue on CPAP at bedtime Work on healthy weight Do not drive if sleepy  HRCT chest when you return from [place] in April  Labs today  Deslym 2 tsp Twice daily  for cough as needed Tessalon Three times a day  for cough as needed  Chlor tabs '4mg'$  (Chlorpheniramine ) 1-2 At bedtime   Protonix '40mg'$  daily before meal in am  Pepcid '20mg'$  At bedtime   Albuterol inhaler As needed   Zyrtec '10mg'$  daily in am .  Continue on Singulair daily. Sips of water to soothe throat, avoid throat clearing and cough.  NO MINTS  Follow-up in 3 months with Dr. Elsworth Soho  or Marki Frede NP and As needed   Please contact office for sooner follow up if symptoms do not improve or worsen or seek emergency care

## 2022-12-17 NOTE — Progress Notes (Signed)
$'@Patient'b$  ID: Tanya [name], female    DOB: 04/26/1956, 67 y.o.   MRN: [id number]  Chief Complaint  Patient presents with   Follow-up    Referring provider: Biagio Borg, MD  HPI: 67 year old female former smoker followed for sleep apnea and mild intermittent asthma and chronic cough  TEST/EVENTS :  Spirometry 08/21/2022 shows no evidence of airway obstruction, ratio 80, FEV1 82% and FVC 78%   Chest x-ray August 21, 2022 clear lungs  CBC with differential April 2023, eosinophil absolute count 100  12/17/2022 Follow up: OSA and Asthma  Patient presents for a 73-monthfollow-up.  Patient has mild intermittent asthma.  Previous spirometry showed no evidence of airflow obstruction.  Chest x-ray was clear.  Patient had no perceived benefit with ICS/LABA combo inhaler.  Patient is had an ongoing cough for greater than 9 months that seems to wax and wane.  She was treated for upper airway cough syndrome with triggers of postnasal drip and GERD.  Last visit she was given a steroid taper.  Since last visit she is feeling better. Cough went away until the holidays. Prednisone really helped. Now cough is back last 4 weeks , comes and goes. She is taking Zyrtec, Protonix and Pepcid . Uses Delsym and Tessalon.  Has had Covid 19 infection x 3 , last episode was 08/2022.  No fever, discolored mucus, chest pain, orthopnea, edema.  Patient has underlying sleep apnea. Recently got new CPAP 09/23/22 from [facility] Says new machine is working great, can not sleep without it. Wears CPAP 8 hrs each night. Feels she benefits from CPAP with decreased daytime sleepiness.  CPAP download unavailable . We have contacted DME for download.   [place], [place] back in [place].    Allergies  Allergen Reactions   Ace Inhibitors Hives   Amoxicillin Hives   Other Hives    Nucafed   Penicillins Hives    Has patient had a PCN reaction causing immediate  rash, facial/tongue/throat swelling, SOB or lightheadedness with hypotension: Yes Has patient had a PCN reaction causing severe rash involving mucus membranes or skin necrosis: No Has patient had a PCN reaction that required hospitalization No Has patient had a PCN reaction occurring within the last 10 years: No If all of the above answers are "NO", then may proceed with Cephalosporin use.    Metformin And Related Other (See Comments)    GI upset   Wound Dressing Adhesive Other (See Comments)    Tore her skin   Pseudoephedrine Hives   Pseudoephedrine-Codeine Hives    Immunization History  Administered Date(s) Administered   H1N1 10/30/2008   Influenza Nasal 08/16/2016   Influenza Split 08/24/2019   Influenza Whole 08/24/1998, 07/25/2009, 08/24/2010, 08/24/2012   Influenza,inj,Quad PF,6+ Mos 10/02/2018, 08/22/2019, 08/10/2020, 09/18/2022   Influenza,inj,Quad PF,6-35 Mos 09/06/2015   Influenza,inj,quad, With Preservative 08/20/2016, 09/12/2017   Influenza-Unspecified 09/03/2013, 08/22/2016, 09/23/2017, 10/02/2018   PFIZER Comirnaty(Gray Top)Covid-19 Tri-Sucrose Vaccine 02/12/2020, 03/04/2020   Pneumococcal Conjugate-13 02/08/2016   Pneumococcal Polysaccharide-23 01/11/2018   Td 11/25/1995, 11/24/2005   Tdap 02/08/2016   Zoster Recombinat (Shingrix) 04/06/2018, 10/09/2018    Past Medical History:  Diagnosis Date   Allergic rhinitis    Blepharospasm 10/09/2010   Cancer ([facility] [place]    Skin   Diabetes mellitus without complication (HCC)    Hip pain    History of melanoma excision    MULTIPLE SITES   Hypertension  Macular pucker 04/2021   Left   Mild intermittent asthma    OA (osteoarthritis)    PMB (postmenopausal bleeding)    PONV (postoperative nausea and vomiting)    did not experience after D&C, request to use same medicaitons from that procedure   Precancerous changes of the cervix    Sleep apnea    tested in home around 2 yrs ago   Varicose vein    wears compression  stocking    Tobacco History: Social History   Tobacco Use  Smoking Status Former   Packs/day: 2.00   Years: 10.00   Total pack years: 20.00   Types: Cigarettes   Quit date: 11/25/1987   Years since quitting: 35.0  Smokeless Tobacco Never   Counseling given: Not Answered   Outpatient Medications Prior to Visit  Medication Sig Dispense Refill   albuterol (VENTOLIN HFA) 108 (90 Base) MCG/ACT inhaler Inhale 2 puffs into the lungs every 6 (six) hours as needed for wheezing or shortness of breath. 18 g 1   amLODipine (NORVASC) 10 MG tablet Take 1 tablet (10 mg total) by mouth daily. 90 tablet 3   aspirin 81 MG tablet Take 1 tablet (81 mg total) by mouth daily. 60 tablet 1   benzonatate (TESSALON) 200 MG capsule Take 1 capsule (200 mg total) by mouth 3 (three) times daily as needed. 45 capsule 3   calcium acetate (PHOSLO) 667 MG capsule TAKE 1 CAPSULE (667 MG TOTAL) BY MOUTH ONCE DAILY 90 capsule 3   celecoxib (CELEBREX) 200 MG capsule TAKE 1 CAPSULE BY MOUTH TWICE A DAY AS NEEDED 60 capsule 5   glipiZIDE (GLIPIZIDE XL) 5 MG 24 hr tablet Take 1 tablet (5 mg total) by mouth daily with breakfast. 90 tablet 3   irbesartan (AVAPRO) 300 MG tablet Take 1 tablet (300 mg total) by mouth daily. 90 tablet 3   methocarbamol (ROBAXIN) 500 MG tablet Take 1 tablet (500 mg total) by mouth every 6 (six) hours as needed for muscle spasms. 30 tablet 1   montelukast (SINGULAIR) 10 MG tablet Take 1 tablet (10 mg total) by mouth daily. 90 tablet 3   Multiple Vitamin (MULTIVITAMIN WITH MINERALS) TABS tablet Take 1 tablet by mouth daily.     oxybutynin (DITROPAN-XL) 10 MG 24 hr tablet oxybutynin chloride ER 10 mg tablet,extended release 24 hr     pantoprazole (PROTONIX) 40 MG tablet Take 1 tablet (40 mg total) by mouth daily. 30 tablet 3   predniSONE (DELTASONE) 10 MG tablet Take 4 tabs by mouth for 3 days, then 3 for 3 days, 2 for 3 days, 1 for 3 days and stop (Patient not taking: Reported on 12/17/2022) 30 tablet  0   No facility-administered medications prior to visit.     Review of Systems:   Constitutional:   No  weight loss, night sweats,  Fevers, chills, fatigue, or  lassitude.  HEENT:   No headaches,  Difficulty swallowing,  Tooth/dental problems, or  Sore throat,                No sneezing, itching, ear ache, nasal congestion, post nasal drip,   CV:  No chest pain,  Orthopnea, PND, swelling in lower extremities, anasarca, dizziness, palpitations, syncope.   GI  No heartburn, indigestion, abdominal pain, nausea, vomiting, diarrhea, change in bowel habits, loss of appetite, bloody stools.   Resp: No shortness of breath with exertion or at rest.  No excess mucus, no productive cough,  No non-productive cough,  No coughing up of blood.  No change in color of mucus.  No wheezing.  No chest wall deformity  Skin: no rash or lesions.  GU: no dysuria, change in color of urine, no urgency or frequency.  No flank pain, no hematuria   MS:  No joint pain or swelling.  No decreased range of motion.  No back pain.    Physical Exam  BP (!) 140/80   Pulse (!) 104   Temp 98.4 F (36.9 C) (Oral)   Ht '5\' 7"'$  (1.702 m)   Wt 269 lb 6.4 oz (122.2 kg)   LMP 09/13/2013   SpO2 98%   BMI 42.19 kg/m   GEN: A/Ox3; pleasant , NAD, well nourished    HEENT:  [place]/AT,  NOSE-clear, THROAT-clear, no lesions, no postnasal drip or exudate noted.   NECK:  Supple w/ fair ROM; no JVD; normal carotid impulses w/o bruits; no thyromegaly or nodules palpated; no lymphadenopathy.    RESP  Clear  P & A; w/o, wheezes/ rales/ or rhonchi. no accessory muscle use, no dullness to percussion  CARD:  RRR, no m/r/g, no peripheral edema, pulses intact, no cyanosis or clubbing.  GI:   Soft & nt; nml bowel sounds; no organomegaly or masses detected.   Musco: Warm bil, no deformities or joint swelling noted.   Neuro: alert, no focal deficits noted.    Skin: Warm, no lesions or rashes    Lab Results:  CBC     Component Value Date/Time   WBC 5.5 03/12/2022 0734   RBC 4.61 03/12/2022 0734   HGB 13.9 03/12/2022 0734   HCT 40.9 03/12/2022 0734   PLT 173.0 03/12/2022 0734   MCV 88.7 03/12/2022 0734   MCH 29.8 05/17/2021 0307   MCHC 34.0 03/12/2022 0734   RDW 13.8 03/12/2022 0734   LYMPHSABS 1.7 03/12/2022 0734   MONOABS 0.3 03/12/2022 0734   EOSABS 0.1 03/12/2022 0734   BASOSABS 0.0 03/12/2022 0734    BMET    Component Value Date/Time   NA 140 03/12/2022 0734   K 4.3 03/12/2022 0734   CL 107 03/12/2022 0734   CO2 23 03/12/2022 0734   GLUCOSE 158 (H) 03/12/2022 0734   BUN 20 03/12/2022 0734   CREATININE 0.65 03/12/2022 0734   CALCIUM 9.3 03/12/2022 0734   GFRNONAA >60 05/16/2021 0310   GFRAA >60 10/04/2016 0610    BNP No results found for: "BNP"  ProBNP No results found for: "PROBNP"  Imaging: No results found.        No data to display          No results found for: "NITRICOXIDE"      Assessment & Plan:   Asthma Mild intermittent asthma versus cough variant asthma.  Will continue to control for triggers.  Previous spirometry showed no significant airflow obstruction.  Albuterol as needed.  No perceived benefit with ICS/LABA combo.  Plan  Patient Instructions  CPAP download .  Continue on CPAP at bedtime Work on healthy weight Do not drive if sleepy  HRCT chest when you return from [place] in April  Labs today  Deslym 2 tsp Twice daily  for cough as needed Tessalon Three times a day  for cough as needed  Chlor tabs '4mg'$  (Chlorpheniramine ) 1-2 At bedtime   Protonix '40mg'$  daily before meal in am  Pepcid '20mg'$  At bedtime   Albuterol inhaler As needed   Zyrtec '10mg'$  daily in am .  Continue on Singulair daily. Sips of water  to soothe throat, avoid throat clearing and cough.  NO MINTS  Follow-up in 3 months with Dr. Elsworth Soho  or Jace Fermin NP and As needed   Please contact office for sooner follow up if symptoms do not improve or worsen or seek emergency care      Allergic rhinitis Continue to control for triggers  Chronic cough Upper airway cough syndrome-cough began March 2023.  Chest x-ray showed clear lungs.  Spirometry with no significant airflow obstruction.  Some improvement with prednisone and cough control regimen.  However not totally resolved.  Will check high-resolution CT chest to rule out interstitial process.  Check allergy profile.  Plan Patient Instructions  CPAP download .  Continue on CPAP at bedtime Work on healthy weight Do not drive if sleepy  HRCT chest when you return from [place] in April  Labs today  Deslym 2 tsp Twice daily  for cough as needed Tessalon Three times a day  for cough as needed  Chlor tabs '4mg'$  (Chlorpheniramine ) 1-2 At bedtime   Protonix '40mg'$  daily before meal in am  Pepcid '20mg'$  At bedtime   Albuterol inhaler As needed   Zyrtec '10mg'$  daily in am .  Continue on Singulair daily. Sips of water to soothe throat, avoid throat clearing and cough.  NO MINTS  Follow-up in 3 months with Dr. Elsworth Soho  or Jael Waldorf NP and As needed   Please contact office for sooner follow up if symptoms do not improve or worsen or seek emergency care       Rexene Edison, NP 12/17/2022

## 2022-12-17 NOTE — Patient Instructions (Addendum)
CPAP download .  Continue on CPAP at bedtime Work on healthy weight Do not drive if sleepy  HRCT chest when you return from [place] in April  Labs today  Deslym 2 tsp Twice daily  for cough as needed Tessalon Three times a day  for cough as needed  Chlor tabs '4mg'$  (Chlorpheniramine ) 1-2 At bedtime   Protonix '40mg'$  daily before meal in am  Pepcid '20mg'$  At bedtime   Albuterol inhaler As needed   Zyrtec '10mg'$  daily in am .  Continue on Singulair daily. Sips of water to soothe throat, avoid throat clearing and cough.  NO MINTS  Follow-up in 3 months with Dr. Elsworth Soho  or Kaimana Neuzil NP and As needed   Please contact office for sooner follow up if symptoms do not improve or worsen or seek emergency care

## 2022-12-17 NOTE — Assessment & Plan Note (Signed)
Continue to control for triggers. 

## 2022-12-17 NOTE — Assessment & Plan Note (Signed)
Mild intermittent asthma versus cough variant asthma.  Will continue to control for triggers.  Previous spirometry showed no significant airflow obstruction.  Albuterol as needed.  No perceived benefit with ICS/LABA combo.  Plan  Patient Instructions  CPAP download .  Continue on CPAP at bedtime Work on healthy weight Do not drive if sleepy  HRCT chest when you return from [place] in April  Labs today  Deslym 2 tsp Twice daily  for cough as needed Tessalon Three times a day  for cough as needed  Chlor tabs '4mg'$  (Chlorpheniramine ) 1-2 At bedtime   Protonix '40mg'$  daily before meal in am  Pepcid '20mg'$  At bedtime   Albuterol inhaler As needed   Zyrtec '10mg'$  daily in am .  Continue on Singulair daily. Sips of water to soothe throat, avoid throat clearing and cough.  NO MINTS  Follow-up in 3 months with Dr. Elsworth Soho  or Ghada Abbett NP and As needed   Please contact office for sooner follow up if symptoms do not improve or worsen or seek emergency care

## 2022-12-18 LAB — RESPIRATORY ALLERGY PROFILE REGION II ~~LOC~~

## 2022-12-18 LAB — INTERPRETATION:

## 2022-12-20 DIAGNOSIS — G4733 Obstructive sleep apnea (adult) (pediatric): Secondary | ICD-10-CM | POA: Diagnosis not present

## 2022-12-20 DIAGNOSIS — I10 Essential (primary) hypertension: Secondary | ICD-10-CM | POA: Diagnosis not present

## 2022-12-22 NOTE — Progress Notes (Signed)
Called and spoke with patient, advised of results/recommendations per Tammy Parrett NP.  She verbalized understanding.  Nothing further needed.

## 2023-01-22 ENCOUNTER — Telehealth: Payer: Self-pay | Admitting: Adult Health

## 2023-01-22 NOTE — Telephone Encounter (Signed)
PT calling stating she has a CT scan sched for May 29th and wanted to move it up to early April. I do not see a CT sched. Pls call PT to advise @ [phone]

## 2023-01-23 NOTE — Telephone Encounter (Signed)
Spoke with patient. She advised [facility] called her this morning and was able to move her CT to April 3rd. Advised patient to call back if she needed anything else. She verbalized understanding. Nothing further needed.

## 2023-02-02 ENCOUNTER — Telehealth: Payer: Self-pay | Admitting: Adult Health

## 2023-02-02 NOTE — Telephone Encounter (Signed)
Called and spoke with patient. Patient stated that tessalon perles aren't covered with her insurance and she can't get them at a discounted price either. Patient wants to know if an alternative can be called In to her pharmacy.   TP, please advise.

## 2023-02-02 NOTE — Telephone Encounter (Signed)
Pt. Calling to ask if there is something else that can get called in then then benzonatate (TESSALON) 200 MG capsule there not covered by insurance and are a high cost so would like to try something else

## 2023-02-03 NOTE — Telephone Encounter (Signed)
Called and spoke with patient. She verbalized understanding.   Nothing further needed at time of call.  

## 2023-02-03 NOTE — Telephone Encounter (Signed)
She is allergic to codeine. If unable to take Tessalon recommend Delsym 2 teaspoons twice daily As needed  .  Most of the over-the-counter cough syrups have a codeine-based.

## 2023-02-18 DIAGNOSIS — G4733 Obstructive sleep apnea (adult) (pediatric): Secondary | ICD-10-CM | POA: Diagnosis not present

## 2023-02-24 ENCOUNTER — Ambulatory Visit: Payer: 59 | Admitting: Adult Health

## 2023-02-25 ENCOUNTER — Ambulatory Visit
Admission: RE | Admit: 2023-02-25 | Discharge: 2023-02-25 | Disposition: A | Discharge status: Home / Self Care | Payer: Medicare HMO | Source: Ambulatory Visit | Attending: Adult Health | Admitting: Adult Health

## 2023-02-25 DIAGNOSIS — J9811 Atelectasis: Secondary | ICD-10-CM | POA: Diagnosis not present

## 2023-02-25 DIAGNOSIS — I700 Atherosclerosis of aorta: Secondary | ICD-10-CM | POA: Diagnosis not present

## 2023-02-25 DIAGNOSIS — R053 Chronic cough: Secondary | ICD-10-CM

## 2023-02-25 DIAGNOSIS — R918 Other nonspecific abnormal finding of lung field: Secondary | ICD-10-CM | POA: Diagnosis not present

## 2023-02-25 DIAGNOSIS — Z85820 Personal history of malignant melanoma of skin: Secondary | ICD-10-CM | POA: Diagnosis not present

## 2023-02-27 ENCOUNTER — Encounter: Payer: Self-pay | Admitting: Adult Health

## 2023-02-27 ENCOUNTER — Ambulatory Visit: Payer: Medicare HMO | Admitting: Adult Health

## 2023-02-27 VITALS — BP 126/74 | HR 95 | Temp 98.2°F | Ht 67.0 in | Wt 267.6 lb

## 2023-02-27 DIAGNOSIS — R053 Chronic cough: Secondary | ICD-10-CM

## 2023-02-27 DIAGNOSIS — G4733 Obstructive sleep apnea (adult) (pediatric): Secondary | ICD-10-CM | POA: Diagnosis not present

## 2023-02-27 DIAGNOSIS — R9389 Abnormal findings on diagnostic imaging of other specified body structures: Secondary | ICD-10-CM

## 2023-02-27 DIAGNOSIS — J4520 Mild intermittent asthma, uncomplicated: Secondary | ICD-10-CM

## 2023-02-27 DIAGNOSIS — J309 Allergic rhinitis, unspecified: Secondary | ICD-10-CM

## 2023-02-27 MED ORDER — PREDNISONE 20 MG PO TABS: 20.0000 mg | ORAL_TABLET | Freq: Every day | ORAL | 0 refills | Status: DC

## 2023-02-27 MED ORDER — AZELASTINE-FLUTICASONE 137-50 MCG/ACT NA SUSP: 1.0000 | Freq: Two times a day (BID) | NASAL | 5 refills | Status: AC

## 2023-02-27 NOTE — Assessment & Plan Note (Signed)
Abnormal CT chest-incidental findings on CT chest showed thyroid nodule, fatty liver and atherosclerosis.  Patient will need follow-up with primary care to discuss these items and further evaluation as indicated.  Patient has an appointment later this month with primary care.

## 2023-02-27 NOTE — Assessment & Plan Note (Signed)
Excellent control and compliance on nocturnal CPAP.  No changes.  Plan  Patient Instructions  Continue on CPAP at bedtime Work on healthy weight Do not drive if sleepy  Prednisone 20 mg daily for 5 days  Begin Dymista 1 puff Twice daily   Change Deslym to Robitussin DM 2 tsp every 4hr for cough As needed   Chlor tabs 4mg  (Chlorpheniramine ) 2 At bedtime   May stop Protonix.  Pepcid 20mg  At bedtime   Albuterol inhaler As needed   Change Zyrtec to Xyzal daily.  Continue on Singulair daily. Sips of water to soothe throat, avoid throat clearing and cough.  NO MINTS    Follow up with Dr. Jonny Ruiz to discuss CT findings: Thyroid nodule, fatty liver and Atherosclerosis.  Follow-up in 6 weeks with Dr. Vassie Loll  or Neill Jurewicz NP and As needed   Please contact office for sooner follow up if symptoms do not improve or worsen or seek emergency care

## 2023-02-27 NOTE — Assessment & Plan Note (Signed)
Upper airway cough syndrome since March 2023.  Chest x-ray shows clear lungs.  High-resolution CT chest without interstitial lung disease.  No acute process to explain cough.  No perceived benefit with ICS/LABA inhaler combo.  Allergen panel negative.  IgE normal.  Absolute eosinophil count normal.  No significant change in symptoms with aggressive trigger prevention with chronic rhinitis and GERD.  May stop Protonix.  Change Zyrtec to Xyzal.  Change Delsym to Robitussin more frequently.  Will give a very short course of prednisone.  If unable to resolve cough on return.  Consider adding in gabapentin  Plan  Patient Instructions  Continue on CPAP at bedtime Work on healthy weight Do not drive if sleepy  Prednisone 20 mg daily for 5 days  Begin Dymista 1 puff Twice daily   Change Deslym to Robitussin DM 2 tsp every 4hr for cough As needed   Chlor tabs 4mg  (Chlorpheniramine ) 2 At bedtime   May stop Protonix.  Pepcid 20mg  At bedtime   Albuterol inhaler As needed   Change Zyrtec to Xyzal daily.  Continue on Singulair daily. Sips of water to soothe throat, avoid throat clearing and cough.  NO MINTS    Follow up with Dr. Jonny Ruiz to discuss CT findings: Thyroid nodule, fatty liver and Atherosclerosis.  Follow-up in 6 weeks with Dr. Vassie Loll  or Leam Madero NP and As needed   Please contact office for sooner follow up if symptoms do not improve or worsen or seek emergency care

## 2023-02-27 NOTE — Patient Instructions (Addendum)
Continue on CPAP at bedtime Work on healthy weight Do not drive if sleepy  Prednisone 20 mg daily for 5 days  Begin Dymista 1 puff Twice daily   Change Deslym to Robitussin DM 2 tsp every 4hr for cough As needed   Chlor tabs 4mg  (Chlorpheniramine ) 2 At bedtime   May stop Protonix.  Pepcid 20mg  At bedtime   Albuterol inhaler As needed   Change Zyrtec to Xyzal daily.  Continue on Singulair daily. Sips of water to soothe throat, avoid throat clearing and cough.  NO MINTS    Follow up with Dr. Jonny Ruiz to discuss CT findings: Thyroid nodule, fatty liver and Atherosclerosis.  Follow-up in 6 weeks with Dr. Vassie Loll  or Latoyia Tecson NP and As needed   Please contact office for sooner follow up if symptoms do not improve or worsen or seek emergency care

## 2023-02-27 NOTE — Progress Notes (Signed)
 @Patient  ID: Tanya DriverPatricia A Steib, female    DOB: 1956-04-03, 67 y.o.   MRN: [id number]  Chief Complaint  Patient presents with   Follow-up    Referring provider: Corwin LevinsJohn, James W, MD  HPI: 67 yo female former smoker followed for sleep apnea, mild intermittent asthma and chronic cough  TEST/EVENTS :  Spirometry 08/21/2022 shows no evidence of airway obstruction, ratio 80, FEV1 82% and FVC 78%    Chest x-ray August 21, 2022 clear lungs   CBC with differential April 2023, eosinophil absolute count 100  02/27/2023 Follow up: OSA and Asthma  Patient presents for a 8617-month follow-up.  Patient has mild intermittent asthma.  Last visit patient was having a chronic cough for last year.  Has been tried on Advair and Symbicort without any perceived benefit.  Previously treated with prednisone taper which helped significantly with cough resolution but cough returned after finishing prednisone.  She has been placed on aggressive trigger prevention with Zyrtec, Protonix and Pepcid along with Delsym and Tessalon.  She was unable to afford Tessalon.  Patient was set up for high-resolution CT chest was done February 25, 2023 that was negative for interstitial lung disease.  Pulmonary nodules in the left lower lobe measuring up to 7 mm.  Incidental finding of 1.6 cm thyroid nodule. Fatty liver and Atherosclerosis .  Says that her cough is essentially unchanged continues to have a cough that is worse in the evening and sometimes has coughing fits.  Hard to predict when the cough is coming feels that she has a tickle in the throat.  Has not tried albuterol because she says it makes her feel nervous. She denies any fever, discolored mucus.  Denies any reflux.  Labs last visit showed negative allergen panel and normal IgE.  Absolute eosinophil count 100.  Patient has obstructive sleep apnea.  She is on nocturnal CPAP.  Says she cannot sleep without it.  Feels that she benefits from CPAP with decreased daytime sleepiness.   CPAP download shows 100% compliance.  Daily average usage at 7 hours.  Patient is on AutoSet 8 to 20 cm H2O.  Daily wedge pressure at 12 cm H2O.  AHI 0.9. Uses nasal mask.    Allergies  Allergen Reactions   Ace Inhibitors Hives   Amoxicillin Hives   Other Hives    Nucafed   Penicillins Hives    Has patient had a PCN reaction causing immediate rash, facial/tongue/throat swelling, SOB or lightheadedness with hypotension: Yes Has patient had a PCN reaction causing severe rash involving mucus membranes or skin necrosis: No Has patient had a PCN reaction that required hospitalization No Has patient had a PCN reaction occurring within the last 10 years: No If all of the above answers are "NO", then may proceed with Cephalosporin use.    Metformin And Related Other (See Comments)    GI upset   Wound Dressing Adhesive Other (See Comments)    Tore her skin   Pseudoephedrine Hives   Pseudoephedrine-Codeine Hives    Immunization History  Administered Date(s) Administered   H1N1 10/30/2008   Influenza Nasal 08/16/2016   Influenza Split 08/24/2019   Influenza Whole 08/24/1998, 07/25/2009, 08/24/2010, 08/24/2012   Influenza,inj,Quad PF,6+ Mos 10/02/2018, 08/22/2019, 08/10/2020, 09/18/2022   Influenza,inj,Quad PF,6-35 Mos 09/06/2015   Influenza,inj,quad, With Preservative 08/20/2016, 09/12/2017   Influenza-Unspecified 09/03/2013, 08/22/2016, 09/23/2017, 10/02/2018   PFIZER Comirnaty(Gray Top)Covid-19 Tri-Sucrose Vaccine 02/12/2020, 03/04/2020   Pneumococcal Conjugate-13 02/08/2016   Pneumococcal Polysaccharide-23 01/11/2018   Td 11/25/1995, 11/24/2005  Tdap 02/08/2016   Zoster Recombinat (Shingrix) 04/06/2018, 10/09/2018    Past Medical History:  Diagnosis Date   Allergic rhinitis    Blepharospasm 10/09/2010   Cancer    Skin   Diabetes mellitus without complication    Hip pain    History of melanoma excision    MULTIPLE SITES   Hypertension    Macular pucker 04/2021   Left    Mild intermittent asthma    OA (osteoarthritis)    PMB (postmenopausal bleeding)    PONV (postoperative nausea and vomiting)    did not experience after D&C, request to use same medicaitons from that procedure   Precancerous changes of the cervix    Sleep apnea    tested in home around 2 yrs ago   Varicose vein    wears compression stocking    Tobacco History: Social History   Tobacco Use  Smoking Status Former   Packs/day: 2.00   Years: 10.00   Additional pack years: 0.00   Total pack years: 20.00   Types: Cigarettes   Quit date: 11/25/1987   Years since quitting: 35.2  Smokeless Tobacco Never   Counseling given: Not Answered   Outpatient Medications Prior to Visit  Medication Sig Dispense Refill   albuterol (VENTOLIN HFA) 108 (90 Base) MCG/ACT inhaler Inhale 2 puffs into the lungs every 6 (six) hours as needed for wheezing or shortness of breath. 18 g 1   amLODipine (NORVASC) 10 MG tablet Take 1 tablet (10 mg total) by mouth daily. 90 tablet 3   aspirin 81 MG tablet Take 1 tablet (81 mg total) by mouth daily. 60 tablet 1   calcium acetate (PHOSLO) 667 MG capsule TAKE 1 CAPSULE (667 MG TOTAL) BY MOUTH ONCE DAILY 90 capsule 3   celecoxib (CELEBREX) 200 MG capsule TAKE 1 CAPSULE BY MOUTH TWICE A DAY AS NEEDED 60 capsule 5   glipiZIDE (GLIPIZIDE XL) 5 MG 24 hr tablet Take 1 tablet (5 mg total) by mouth daily with breakfast. 90 tablet 3   irbesartan (AVAPRO) 300 MG tablet Take 1 tablet (300 mg total) by mouth daily. 90 tablet 3   methocarbamol (ROBAXIN) 500 MG tablet Take 1 tablet (500 mg total) by mouth every 6 (six) hours as needed for muscle spasms. 30 tablet 1   montelukast (SINGULAIR) 10 MG tablet Take 1 tablet (10 mg total) by mouth daily. 90 tablet 3   Multiple Vitamin (MULTIVITAMIN WITH MINERALS) TABS tablet Take 1 tablet by mouth daily.     oxybutynin (DITROPAN-XL) 10 MG 24 hr tablet oxybutynin chloride ER 10 mg tablet,extended release 24 hr     pantoprazole (PROTONIX)  40 MG tablet Take 1 tablet (40 mg total) by mouth daily. 30 tablet 3   benzonatate (TESSALON) 200 MG capsule Take 1 capsule (200 mg total) by mouth 3 (three) times daily as needed. (Patient not taking: Reported on 02/27/2023) 45 capsule 3   predniSONE (DELTASONE) 10 MG tablet Take 4 tabs by mouth for 3 days, then 3 for 3 days, 2 for 3 days, 1 for 3 days and stop (Patient not taking: Reported on 12/17/2022) 30 tablet 0   No facility-administered medications prior to visit.     Review of Systems:   Constitutional:   No  weight loss, night sweats,  Fevers, chills, fatigue, or  lassitude.  HEENT:   No headaches,  Difficulty swallowing,  Tooth/dental problems, or  Sore throat,  No sneezing, itching, ear ache, nasal congestion, post nasal drip, positive throat tickle  CV:  No chest pain,  Orthopnea, PND, swelling in lower extremities, anasarca, dizziness, palpitations, syncope.   GI  No heartburn, indigestion, abdominal pain, nausea, vomiting, diarrhea, change in bowel habits, loss of appetite, bloody stools.   Resp:  No coughing up of blood.  No change in color of mucus.  No wheezing.  No chest wall deformity  Skin: no rash or lesions.  GU: no dysuria, change in color of urine, no urgency or frequency.  No flank pain, no hematuria   MS:  No joint pain or swelling.  No decreased range of motion.  No back pain.    Physical Exam  BP 126/74 (BP Location: Left Arm, Patient Position: Sitting, Cuff Size: Large)   Pulse 95   Temp 98.2 F (36.8 C) (Oral)   Ht 5\' 7"  (1.702 m)   Wt 267 lb 9.6 oz (121.4 kg)   LMP 09/13/2013   SpO2 95%   BMI 41.91 kg/m   GEN: A/Ox3; pleasant , NAD, well nourished    HEENT:  [place]/AT,   NOSE-clear, THROAT-clear, no lesions, no postnasal drip or exudate noted. Class 2 MP airway   NECK:  Supple w/ fair ROM; no JVD; normal carotid impulses w/o bruits; no thyromegaly or nodules palpated; no lymphadenopathy.    RESP  Clear  P & A; w/o, wheezes/ rales/  or rhonchi. no accessory muscle use, no dullness to percussion  CARD:  RRR, no m/r/g, tr peripheral edema, pulses intact, no cyanosis or clubbing.  GI:   Soft & nt; nml bowel sounds; no organomegaly or masses detected.   Musco: Warm bil, no deformities or joint swelling noted.   Neuro: alert, no focal deficits noted.    Skin: Warm, no lesions or rashes    Lab Results:  CBC    Component Value Date/Time   WBC 5.8 12/17/2022 0943   RBC 4.77 12/17/2022 0943   HGB 14.4 12/17/2022 0943   HCT 42.7 12/17/2022 0943   PLT 195.0 12/17/2022 0943   MCV 89.5 12/17/2022 0943   MCH 29.8 05/17/2021 0307   MCHC 33.6 12/17/2022 0943   RDW 13.9 12/17/2022 0943   LYMPHSABS 1.6 12/17/2022 0943   MONOABS 0.4 12/17/2022 0943   EOSABS 0.1 12/17/2022 0943   BASOSABS 0.0 12/17/2022 0943    BMET    Component Value Date/Time   NA 140 03/12/2022 0734   K 4.3 03/12/2022 0734   CL 107 03/12/2022 0734   CO2 23 03/12/2022 0734   GLUCOSE 158 (H) 03/12/2022 0734   BUN 20 03/12/2022 0734   CREATININE 0.65 03/12/2022 0734   CALCIUM 9.3 03/12/2022 0734   GFRNONAA >60 05/16/2021 0310   GFRAA >60 10/04/2016 0610    BNP No results found for: "BNP"  ProBNP No results found for: "PROBNP"  Imaging: CT Chest High Resolution  Result Date: 02/25/2023 CLINICAL DATA:  Chronic cough, former smoker.  History of melanoma. EXAM: CT CHEST WITHOUT CONTRAST TECHNIQUE: Multidetector CT imaging of the chest was performed following the standard protocol without intravenous contrast. High resolution imaging of the lungs, as well as inspiratory and expiratory imaging, was performed. RADIATION DOSE REDUCTION: This exam was performed according to the departmental dose-optimization program which includes automated exposure control, adjustment of the mA and/or kV according to patient size and/or use of iterative reconstruction technique. COMPARISON:  None Available. FINDINGS: Cardiovascular: Atherosclerotic calcification of  the aorta and left anterior descending coronary artery.  Heart is at the upper limits of normal in size. No pericardial effusion. Mediastinum/Nodes: Low-attenuation thyroid nodules measure up to approximately 1.6 cm on the right. No pathologically enlarged mediastinal or axillary lymph nodes. Hilar regions are difficult to definitively evaluate without IV contrast. Esophagus is grossly unremarkable. Lungs/Pleura: Negative for subpleural reticulation, traction bronchiectasis/bronchiolectasis, ground-glass, architectural distortion or honeycombing. Pulmonary nodules measure up to 7 mm in the lateral left lower lobe (7/81). Minimal dependent atelectasis in the lower lobes, right greater than left. Airway is unremarkable. Mild air trapping. Upper Abdomen: Liver is decreased in attenuation diffusely and is likely enlarged although incompletely imaged. Visualized portions of the liver, adrenal glands, left kidney, spleen, pancreas, stomach and bowel are grossly unremarkable. No upper abdominal adenopathy. Likely small reactive periportal lymph nodes. Musculoskeletal: Degenerative changes in the spine. No worrisome lytic or sclerotic lesions. IMPRESSION: 1. No evidence of interstitial lung disease. Mild air trapping is indicative of small airways disease. 2. Pulmonary nodules measure up to 7 mm. Non-contrast chest CT at 3-6 months is recommended. If the nodules are stable at time of repeat CT, then future CT at 18-24 months (from today's scan) is considered optional for low-risk patients, but is recommended for high-risk patients. This recommendation follows the consensus statement: Guidelines for Management of Incidental Pulmonary Nodules Detected on CT Images: From the Fleischner Society 2017; Radiology 2017; 284:228-243. 3. Hepatic steatosis. 4. 1.6 cm low-attenuation thyroid nodule. Recommend thyroid ultrasound. (Ref: J Am Coll Radiol. 2015 Feb;12(2): 143-50). 5. Aortic atherosclerosis (ICD10-I70.0). Left anterior  descending coronary artery calcification. Electronically Signed   By: Leanna Battles M.D.   On: 02/25/2023 11:40          No data to display          No results found for: "NITRICOXIDE"      Assessment & Plan:   Obstructive sleep apnea Excellent control and compliance on nocturnal CPAP.  No changes.  Plan  Patient Instructions  Continue on CPAP at bedtime Work on healthy weight Do not drive if sleepy  Prednisone 20 mg daily for 5 days  Begin Dymista 1 puff Twice daily   Change Deslym to Robitussin DM 2 tsp every 4hr for cough As needed   Chlor tabs 4mg  (Chlorpheniramine ) 2 At bedtime   May stop Protonix.  Pepcid 20mg  At bedtime   Albuterol inhaler As needed   Change Zyrtec to Xyzal daily.  Continue on Singulair daily. Sips of water to soothe throat, avoid throat clearing and cough.  NO MINTS    Follow up with Dr. Jonny Ruiz to discuss CT findings: Thyroid nodule, fatty liver and Atherosclerosis.  Follow-up in 6 weeks with Dr. Vassie Loll  or Sharie Amorin NP and As needed   Please contact office for sooner follow up if symptoms do not improve or worsen or seek emergency care     Asthma Mild intermittent asthma-no significant wheezing.  Previous spirometry showed no significant airflow obstruction.  No perceived benefit with Advair or Symbicort.  Plan  Patient Instructions  Continue on CPAP at bedtime Work on healthy weight Do not drive if sleepy  Prednisone 20 mg daily for 5 days  Begin Dymista 1 puff Twice daily   Change Deslym to Robitussin DM 2 tsp every 4hr for cough As needed   Chlor tabs 4mg  (Chlorpheniramine ) 2 At bedtime   May stop Protonix.  Pepcid 20mg  At bedtime   Albuterol inhaler As needed   Change Zyrtec to Xyzal daily.  Continue on Singulair daily. Sips of  water to soothe throat, avoid throat clearing and cough.  NO MINTS    Follow up with Dr. Jonny Ruiz to discuss CT findings: Thyroid nodule, fatty liver and Atherosclerosis.  Follow-up in 6 weeks with  Dr. Vassie Loll  or Edelmira Gallogly NP and As needed   Please contact office for sooner follow up if symptoms do not improve or worsen or seek emergency care     Allergic rhinitis Add Dymista 1 puff twice daily  Chronic cough Upper airway cough syndrome since March 2023.  Chest x-ray shows clear lungs.  High-resolution CT chest without interstitial lung disease.  No acute process to explain cough.  No perceived benefit with ICS/LABA inhaler combo.  Allergen panel negative.  IgE normal.  Absolute eosinophil count normal.  No significant change in symptoms with aggressive trigger prevention with chronic rhinitis and GERD.  May stop Protonix.  Change Zyrtec to Xyzal.  Change Delsym to Robitussin more frequently.  Will give a very short course of prednisone.  If unable to resolve cough on return.  Consider adding in gabapentin  Plan  Patient Instructions  Continue on CPAP at bedtime Work on healthy weight Do not drive if sleepy  Prednisone 20 mg daily for 5 days  Begin Dymista 1 puff Twice daily   Change Deslym to Robitussin DM 2 tsp every 4hr for cough As needed   Chlor tabs 4mg  (Chlorpheniramine ) 2 At bedtime   May stop Protonix.  Pepcid 20mg  At bedtime   Albuterol inhaler As needed   Change Zyrtec to Xyzal daily.  Continue on Singulair daily. Sips of water to soothe throat, avoid throat clearing and cough.  NO MINTS    Follow up with Dr. Jonny Ruiz to discuss CT findings: Thyroid nodule, fatty liver and Atherosclerosis.  Follow-up in 6 weeks with Dr. Vassie Loll  or Janit Cutter NP and As needed   Please contact office for sooner follow up if symptoms do not improve or worsen or seek emergency care     Abnormal CT scan Abnormal CT chest-incidental findings on CT chest showed thyroid nodule, fatty liver and atherosclerosis.  Patient will need follow-up with primary care to discuss these items and further evaluation as indicated.  Patient has an appointment later this month with primary care.     Rubye Oaks,  NP 02/27/2023

## 2023-02-27 NOTE — Assessment & Plan Note (Signed)
Add Dymista 1 puff twice daily

## 2023-02-27 NOTE — Assessment & Plan Note (Signed)
Mild intermittent asthma-no significant wheezing.  Previous spirometry showed no significant airflow obstruction.  No perceived benefit with Advair or Symbicort.  Plan  Patient Instructions  Continue on CPAP at bedtime Work on healthy weight Do not drive if sleepy  Prednisone 20 mg daily for 5 days  Begin Dymista 1 puff Twice daily   Change Deslym to Robitussin DM 2 tsp every 4hr for cough As needed   Chlor tabs 4mg  (Chlorpheniramine ) 2 At bedtime   May stop Protonix.  Pepcid 20mg  At bedtime   Albuterol inhaler As needed   Change Zyrtec to Xyzal daily.  Continue on Singulair daily. Sips of water to soothe throat, avoid throat clearing and cough.  NO MINTS    Follow up with Dr. Jonny [name] to discuss CT findings: Thyroid nodule, fatty liver and Atherosclerosis.  Follow-up in 6 weeks with Dr. Vassie [name]  or Tanya Rathbun NP and As needed   Please contact office for sooner follow up if symptoms do not improve or worsen or seek emergency care

## 2023-03-02 LAB — HM DIABETES EYE EXAM

## 2023-03-09 ENCOUNTER — Encounter: Payer: Self-pay | Admitting: Internal Medicine

## 2023-03-12 ENCOUNTER — Other Ambulatory Visit: Payer: Self-pay | Admitting: Internal Medicine

## 2023-03-12 ENCOUNTER — Other Ambulatory Visit (INDEPENDENT_AMBULATORY_CARE_PROVIDER_SITE_OTHER): Payer: Medicare HMO

## 2023-03-12 DIAGNOSIS — E1165 Type 2 diabetes mellitus with hyperglycemia: Secondary | ICD-10-CM | POA: Diagnosis not present

## 2023-03-12 DIAGNOSIS — E538 Deficiency of other specified B group vitamins: Secondary | ICD-10-CM

## 2023-03-12 DIAGNOSIS — E559 Vitamin D deficiency, unspecified: Secondary | ICD-10-CM

## 2023-03-12 DIAGNOSIS — R300 Dysuria: Secondary | ICD-10-CM

## 2023-03-12 LAB — HEPATIC FUNCTION PANEL
ALT: 49 U/L — ABNORMAL HIGH (ref 0–35)
AST: 31 U/L (ref 0–37)
Albumin: 4.2 g/dL (ref 3.5–5.2)
Alkaline Phosphatase: 79 U/L (ref 39–117)
Bilirubin, Direct: 0.2 mg/dL (ref 0.0–0.3)
Total Bilirubin: 0.6 mg/dL (ref 0.2–1.2)
Total Protein: 6.8 g/dL (ref 6.0–8.3)

## 2023-03-12 LAB — BASIC METABOLIC PANEL
BUN: 15 mg/dL (ref 6–23)
CO2: 27 mEq/L (ref 19–32)
Calcium: 9.5 mg/dL (ref 8.4–10.5)
Chloride: 105 mEq/L (ref 96–112)
Creatinine, Ser: 0.69 mg/dL (ref 0.40–1.20)
GFR: 91.29 mL/min (ref >60.00)
Glucose, Bld: 150 mg/dL — ABNORMAL HIGH (ref 70–99)
Potassium: 4.3 mEq/L (ref 3.5–5.1)
Sodium: 140 mEq/L (ref 135–145)

## 2023-03-12 LAB — URINALYSIS, ROUTINE W REFLEX MICROSCOPIC
Bilirubin Urine: NEGATIVE
Hgb urine dipstick: NEGATIVE
Ketones, ur: NEGATIVE
Leukocytes,Ua: NEGATIVE
Nitrite: NEGATIVE
RBC / HPF: NONE SEEN (ref 0–?)
Specific Gravity, Urine: <=1.005 — AB (ref 1.000–1.030)
Total Protein, Urine: NEGATIVE
Urine Glucose: NEGATIVE
Urobilinogen, UA: 0.2 (ref 0.0–1.0)
pH: 6 (ref 5.0–8.0)

## 2023-03-12 LAB — VITAMIN D 25 HYDROXY (VIT D DEFICIENCY, FRACTURES): VITD: 36.75 ng/mL (ref 30.00–100.00)

## 2023-03-12 LAB — LIPID PANEL
Cholesterol: 135 mg/dL (ref 0–200)
HDL: 49.2 mg/dL (ref >39.00)
LDL Cholesterol: 67 mg/dL (ref 0–99)
NonHDL: 85.36
Total CHOL/HDL Ratio: 3
Triglycerides: 91 mg/dL (ref 0.0–149.0)
VLDL: 18.2 mg/dL (ref 0.0–40.0)

## 2023-03-12 LAB — VITAMIN B12: Vitamin B-12: 482 pg/mL (ref 211–911)

## 2023-03-12 LAB — HEMOGLOBIN A1C: Hgb A1c MFr Bld: 7.6 % — ABNORMAL HIGH (ref 4.6–6.5)

## 2023-03-12 MED ORDER — GLIPIZIDE ER 10 MG PO TB24: 10.0000 mg | ORAL_TABLET | Freq: Every day | ORAL | 3 refills | Status: DC

## 2023-03-13 ENCOUNTER — Encounter: Payer: Self-pay | Admitting: Internal Medicine

## 2023-03-13 LAB — URINE CULTURE: Result:: NO GROWTH

## 2023-03-17 DIAGNOSIS — G5131 Clonic hemifacial spasm, right: Secondary | ICD-10-CM | POA: Diagnosis not present

## 2023-03-18 ENCOUNTER — Ambulatory Visit: Payer: 59 | Admitting: Adult Health

## 2023-03-19 ENCOUNTER — Encounter: Payer: 59 | Admitting: Internal Medicine

## 2023-03-21 DIAGNOSIS — G4733 Obstructive sleep apnea (adult) (pediatric): Secondary | ICD-10-CM | POA: Diagnosis not present

## 2023-03-23 ENCOUNTER — Ambulatory Visit (INDEPENDENT_AMBULATORY_CARE_PROVIDER_SITE_OTHER): Payer: Medicare HMO | Admitting: Internal Medicine

## 2023-03-23 ENCOUNTER — Encounter: Payer: Self-pay | Admitting: Internal Medicine

## 2023-03-23 ENCOUNTER — Ambulatory Visit: Payer: 59 | Admitting: Adult Health

## 2023-03-23 VITALS — BP 124/80 | HR 99 | Temp 97.9°F | Ht 67.0 in | Wt 262.0 lb

## 2023-03-23 DIAGNOSIS — Z7984 Long term (current) use of oral hypoglycemic drugs: Secondary | ICD-10-CM | POA: Diagnosis not present

## 2023-03-23 DIAGNOSIS — Z0001 Encounter for general adult medical examination with abnormal findings: Secondary | ICD-10-CM

## 2023-03-23 DIAGNOSIS — E041 Nontoxic single thyroid nodule: Secondary | ICD-10-CM | POA: Insufficient documentation

## 2023-03-23 DIAGNOSIS — Z23 Encounter for immunization: Secondary | ICD-10-CM

## 2023-03-23 DIAGNOSIS — E559 Vitamin D deficiency, unspecified: Secondary | ICD-10-CM | POA: Diagnosis not present

## 2023-03-23 DIAGNOSIS — E1165 Type 2 diabetes mellitus with hyperglycemia: Secondary | ICD-10-CM

## 2023-03-23 DIAGNOSIS — I10 Essential (primary) hypertension: Secondary | ICD-10-CM

## 2023-03-23 NOTE — Patient Instructions (Addendum)
You had the Prevnar 20 shot today  Ok to increase the glipizide ER 10 mg per day  Please take OTC Vitamin D3 at 2000 units per day, indefinitely  Please continue all other medications as before, and refills have been done if requested.  Please have the pharmacy call with any other refills you may need.  Please continue your efforts at being more active, low cholesterol diet, and weight control.  You are otherwise up to date with prevention measures today.  Please keep your appointments with your specialists as you may have planned - pulmonary for the lung nodule  You will be contacted regarding the referral for: thyroid ultrasound for the nodule  Your lab testing was otherwise good today  Please make an Appointment to return in 6 months, or sooner if needed, also with Lab Appointment for testing done 3-5 days before at the FIRST FLOOR Lab (so this is for TWO appointments - please see the scheduling desk as you leave)

## 2023-03-23 NOTE — Assessment & Plan Note (Addendum)
Age and sex appropriate education and counseling updated with regular exercise and diet Referrals for preventative services - declines dxa for now pt to call for own mammogram Immunizations addressed - for prevnar 20  Smoking counseling  - none needed Evidence for depression or other mood disorder - none significant Most recent labs reviewed. I have personally reviewed and have noted: 1) the patient's medical and social history 2) The patient's current medications and supplements 3) The patient's height, weight, and BMI have been recorded in the chart

## 2023-03-23 NOTE — Assessment & Plan Note (Signed)
BP Readings from Last 3 Encounters:  03/23/23 124/80  02/27/23 126/74  12/17/22 (!) 140/80   Stable, pt to continue medical treatment norvasc 10 qd, avapro 300 qd

## 2023-03-23 NOTE — Assessment & Plan Note (Signed)
Last vitamin D Lab Results  Component Value Date   VD25OH 36.75 03/12/2023   Low to start oral replacement

## 2023-03-23 NOTE — Progress Notes (Signed)
Patient ID: Tanya [name], female   DOB: 1956/06/29, 67 y.o.   MRN: [id number]         Chief Complaint:: wellness exam and dm, htn, thyroid nodule, low vit d       HPI:  Tanya [name] is a 67 y.o. female here for wellness exam; declines dxa for now, for mammogram on her calling, for prevnar 20 today,o/w up to date                        Also Pt denies chest pain, increased sob or doe, wheezing, orthopnea, PND, increased LE swelling, palpitations, dizziness or syncope.   Pt denies polydipsia, polyuria, or new focal neuro s/s.    Pt denies fever, wt loss, night sweats, loss of appetite, or other constitutional symptoms  Not taking Vit D   Wt Readings from Last 3 Encounters:  03/23/23 262 lb (118.8 kg)  02/27/23 267 lb 9.6 oz (121.4 kg)  12/17/22 269 lb 6.4 oz (122.2 kg)   BP Readings from Last 3 Encounters:  03/23/23 124/80  02/27/23 126/74  12/17/22 (!) 140/80   Immunization History  Administered Date(s) Administered   H1N1 10/30/2008   Influenza Nasal 08/16/2016   Influenza Split 08/24/2019   Influenza Whole 08/24/1998, 07/25/2009, 08/24/2010, 08/24/2012   Influenza,inj,Quad PF,6+ Mos 10/02/2018, 08/22/2019, 08/10/2020, 09/18/2022   Influenza,inj,Quad PF,6-35 Mos 09/06/2015   Influenza,inj,quad, With Preservative 08/20/2016, 09/12/2017   Influenza-Unspecified 09/03/2013, 08/22/2016, 09/23/2017, 10/02/2018   PFIZER Comirnaty(Gray Top)Covid-19 Tri-Sucrose Vaccine 02/12/2020, 03/04/2020   PNEUMOCOCCAL CONJUGATE-20 03/23/2023   Pneumococcal Conjugate-13 02/08/2016   Pneumococcal Polysaccharide-23 01/11/2018   Td 11/25/1995, 11/24/2005   Tdap 02/08/2016   Zoster Recombinat (Shingrix) 04/06/2018, 10/09/2018   Health Maintenance Due  Topic Date Due   Medicare Annual Wellness (AWV)  Never done   DEXA SCAN  Never done   Diabetic kidney evaluation - Urine ACR  03/13/2023      Past Medical History:  Diagnosis Date   Allergic rhinitis    Blepharospasm 10/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   Cancer (HCC)     Skin   Diabetes mellitus without complication (HCC)    Hip pain    History of melanoma excision    MULTIPLE SITES   Hypertension    Macular pucker 04/2021   Left   Mild intermittent asthma    OA (osteoarthritis)    PMB (postmenopausal bleeding)    PONV (postoperative nausea and vomiting)    did not experience after D&C, request to use same medicaitons from that procedure   Precancerous changes of the cervix    Sleep apnea    tested in home around 2 yrs ago   Varicose vein    wears compression stocking   Past Surgical History:  Procedure Laterality Date   ABDOMINAL HYSTERECTOMY     COLONOSCOPY     DILATION AND CURETTAGE OF UTERUS     ESOPHAGOGASTRODUODENOSCOPY  10/14/2004   HAMMER TOE SURGERY     HYSTEROSCOPY WITH D & C N/A 04/07/2016   Procedure: DILATATION AND CURETTAGE /HYSTEROSCOPY WITH MYOSURE POLYPECTOMY;  Surgeon: Lavina Hamman, MD;  Location[facility];  Service: Gynecology;  Laterality: N/A;   JOINT REPLACEMENT     KNEE ARTHROSCOPY Bilateral left 08-01-2005/  right   LAPAROSCOPIC HYSTERECTOMY N/A 06/02/2016   Procedure: HYSTERECTOMY TOTAL LAPAROSCOPIC;  Surgeon: Lavina Hamman, MD;  Location: [facility];  Service: Gynecology;  Laterality: N/A;   LAPAROSCOPIC SALPINGO OOPHERECTOMY Bilateral 06/02/2016   Procedure: LAPAROSCOPIC SALPINGO OOPHORECTOMY;  Surgeon:  Lavina Hamman, MD;  Location: [facility];  Service: Gynecology;  Laterality: Bilateral;   RE-DO ROTATOR CUFF REPAIR/  SAD/  ACROMIOPLASTY Right 01-04-2009   SHOULDER ARTHROSCOPY W/ ROTATOR CUFF REPAIR Right    tailor bunion removal     TONSILLECTOMY     TOTAL HIP ARTHROPLASTY Right 09/12/2016   Procedure: RIGHT TOTAL HIP ARTHROPLASTY ANTERIOR APPROACH;  Surgeon: Samson Frederic, MD;  Location: [facility];  Service: Orthopedics;  Laterality: Right;   TOTAL HIP ARTHROPLASTY Right 10/01/2016   Procedure: IRRIGATION AND DEBRIDEMENT RIGHT HIP;  Surgeon: Samson Frederic, MD;  Location: [facility];  Service: Orthopedics;   Laterality: Right;   TOTAL KNEE ARTHROPLASTY Left 12/29/2013   Procedure: LEFT TOTAL KNEE ARTHROPLASTY;  Surgeon: Javier Docker, MD;  Location: [facility];  Service: Orthopedics;  Laterality: Left;   TOTAL KNEE ARTHROPLASTY Right 05/15/2021   Procedure: TOTAL KNEE ARTHROPLASTY;  Surgeon: Jene Every, MD;  Location: [facility];  Service: Orthopedics;  Laterality: Right;  2.5 hrs    reports that she quit smoking about 35 years ago. Her smoking use included cigarettes. She has a 20.00 pack-year smoking history. She has never used smokeless tobacco. She reports current alcohol use of about 2.0 standard drinks of alcohol per week. She reports that she does not use drugs. family history includes Asthma in an other family member; Diabetes in an other family member; Esophageal cancer in her father; Melanoma in her sister; Ovarian cancer in an other family member. Allergies  Allergen Reactions   Ace Inhibitors Hives   Amoxicillin Hives   Other Hives    Nucafed   Penicillins Hives    Has patient had a PCN reaction causing immediate rash, facial/tongue/throat swelling, SOB or lightheadedness with hypotension: Yes Has patient had a PCN reaction causing severe rash involving mucus membranes or skin necrosis: No Has patient had a PCN reaction that required hospitalization No Has patient had a PCN reaction occurring within the last 10 years: No If all of the above answers are "NO", then may proceed with Cephalosporin use.    Metformin And Related Other (See Comments)    GI upset   Wound Dressing Adhesive Other (See Comments)    Tore her skin   Pseudoephedrine Hives   Pseudoephedrine-Codeine Hives   Current Outpatient Medications on File Prior to Visit  Medication Sig Dispense Refill   albuterol (VENTOLIN HFA) 108 (90 Base) MCG/ACT inhaler Inhale 2 puffs into the lungs every 6 (six) hours as needed for wheezing or shortness of breath. 18 g 1   amLODipine (NORVASC) 10 MG tablet Take 1 tablet (10 mg total) by  mouth daily. 90 tablet 3   aspirin 81 MG tablet Take 1 tablet (81 mg total) by mouth daily. 60 tablet 1   Azelastine-Fluticasone 137-50 MCG/ACT SUSP Place 1 puff into the nose 2 (two) times daily. 1 g 5   calcium acetate (PHOSLO) 667 MG capsule TAKE 1 CAPSULE (667 MG TOTAL) BY MOUTH ONCE DAILY 90 capsule 3   celecoxib (CELEBREX) 200 MG capsule TAKE 1 CAPSULE BY MOUTH TWICE A DAY AS NEEDED 60 capsule 5   fluticasone-salmeterol (WIXELA INHUB) 250-50 MCG/ACT AEPB Inhale 1 puff into the lungs 2 (two) times daily.     glipiZIDE (GLUCOTROL XL) 10 MG 24 hr tablet Take 1 tablet (10 mg total) by mouth daily with breakfast. 90 tablet 3   irbesartan (AVAPRO) 300 MG tablet Take 1 tablet (300 mg total) by mouth daily. 90 tablet 3   methocarbamol (ROBAXIN) 500 MG  tablet Take 1 tablet (500 mg total) by mouth every 6 (six) hours as needed for muscle spasms. 30 tablet 1   montelukast (SINGULAIR) 10 MG tablet Take 1 tablet (10 mg total) by mouth daily. 90 tablet 3   Multiple Vitamin (MULTIVITAMIN WITH MINERALS) TABS tablet Take 1 tablet by mouth daily.     oxybutynin (DITROPAN-XL) 10 MG 24 hr tablet oxybutynin chloride ER 10 mg tablet,extended release 24 hr     pantoprazole (PROTONIX) 40 MG tablet Take 1 tablet (40 mg total) by mouth daily. 30 tablet 3   predniSONE (DELTASONE) 20 MG tablet Take 1 tablet (20 mg total) by mouth daily with breakfast. 5 tablet 0   No current facility-administered medications on file prior to visit.        ROS:  All others reviewed and negative.  Objective        PE:  BP 124/80 (BP Location: Right Arm, Patient Position: Sitting, Cuff Size: Normal)   Pulse 99   Temp 97.9 F (36.6 C) (Oral)   Ht 5\' 7"  (1.702 m)   Wt 262 lb (118.8 kg)   LMP 09/13/2013   SpO2 99%   BMI 41.04 kg/m                 Constitutional: Pt appears in NAD               HENT: Head: NCAT.                Right Ear: External ear normal.                 Left Ear: External ear normal.                Eyes:  . Pupils are equal, round, and reactive to light. Conjunctivae and EOM are normal               Nose: without d/c or deformity               Neck: Neck supple. Gross normal ROM               Cardiovascular: Normal rate and regular rhythm.                 Pulmonary/Chest: Effort normal and breath sounds without rales or wheezing.                Abd:  Soft, NT, ND, + BS, no organomegaly               Neurological: Pt is alert. At baseline orientation, motor grossly intact               Skin: Skin is warm. No rashes, no other new lesions, LE edema - none               Psychiatric: Pt behavior is normal without agitation   Micro: none  Cardiac tracings I have personally interpreted today:  none  Pertinent Radiological findings (summarize): none   Lab Results  Component Value Date   WBC 5.8 12/17/2022   HGB 14.4 12/17/2022   HCT 42.7 12/17/2022   PLT 195.0 12/17/2022   GLUCOSE 150 (H) 03/12/2023   CHOL 135 03/12/2023   TRIG 91.0 03/12/2023   HDL 49.20 03/12/2023   LDLCALC 67 03/12/2023   ALT 49 (H) 03/12/2023   AST 31 03/12/2023   NA 140 03/12/2023   K 4.3 03/12/2023   CL 105 03/12/2023   CREATININE 0.69  03/12/2023   BUN 15 03/12/2023   CO2 27 03/12/2023   TSH 0.85 03/12/2022   INR 1.0 04/04/2021   HGBA1C 7.6 (H) 03/12/2023   MICROALBUR <0.7 03/12/2022   Assessment/Plan:  Tanya [name] is a 67 y.o. White or Caucasian [1] female with  has a past medical history of Allergic rhinitis, Blepharospasm (10/09/2010), Cancer (HCC), Diabetes mellitus without complication (HCC), Hip pain, History of melanoma excision, Hypertension, Macular pucker (04/2021), Mild intermittent asthma, OA (osteoarthritis), PMB (postmenopausal bleeding), PONV (postoperative nausea and vomiting), Precancerous changes of the cervix, Sleep apnea, and Varicose vein.  Encounter for well adult exam with abnormal findings Age and sex appropriate education and counseling updated with regular exercise and  diet Referrals for preventative services - declines dxa for now pt to call for own mammogram Immunizations addressed - for prevnar 20  Smoking counseling  - none needed Evidence for depression or other mood disorder - none significant Most recent labs reviewed. I have personally reviewed and have noted: 1) the patient's medical and social history 2) The patient's current medications and supplements 3) The patient's height, weight, and BMI have been recorded in the chart   Diabetes mellitus (HCC) Lab Results  Component Value Date   HGBA1C 7.6 (H) 03/12/2023   Uncontrolled, pt to increase current medical treatment to glipizde ER 10 mg qd   Hypertensive disorder BP Readings from Last 3 Encounters:  03/23/23 124/80  02/27/23 126/74  12/17/22 (!) 140/80   Stable, pt to continue medical treatment norvasc 10 qd, avapro 300 qd   Thyroid nodule Incidental noted on recent CT - for thryoid u/s, consider biopsy  Vitamin D deficiency Last vitamin D Lab Results  Component Value Date   VD25OH 36.75 03/12/2023   Low to start oral replacement  Followup: No follow-ups on file.  Oliver Barre, MD 03/23/2023 9:47 PM [facility] [place]

## 2023-03-23 NOTE — Addendum Note (Signed)
Addended by: Corwin Levins on: 03/23/2023 09:48 PM   Modules accepted: Orders

## 2023-03-23 NOTE — Assessment & Plan Note (Signed)
Incidental noted on recent CT - for thryoid u/s, consider biopsy

## 2023-03-23 NOTE — Assessment & Plan Note (Signed)
Lab Results  Component Value Date   HGBA1C 7.6 (H) 03/12/2023   Uncontrolled, pt to increase current medical treatment to glipizde ER 10 mg qd

## 2023-03-24 ENCOUNTER — Other Ambulatory Visit: Payer: Self-pay | Admitting: Internal Medicine

## 2023-03-24 ENCOUNTER — Encounter: Payer: Self-pay | Admitting: Internal Medicine

## 2023-03-24 MED ORDER — CELECOXIB 200 MG PO CAPS: 200.0000 mg | ORAL_CAPSULE | Freq: Two times a day (BID) | ORAL | 5 refills | Status: DC | PRN

## 2023-03-24 MED ORDER — ALBUTEROL SULFATE HFA 108 (90 BASE) MCG/ACT IN AERS: 2.0000 | INHALATION_SPRAY | Freq: Four times a day (QID) | RESPIRATORY_TRACT | 2 refills | Status: DC | PRN

## 2023-03-24 MED ORDER — OXYBUTYNIN CHLORIDE ER 10 MG PO TB24: ORAL_TABLET | ORAL | 3 refills | Status: DC

## 2023-03-24 MED ORDER — METHOCARBAMOL 500 MG PO TABS: 500.0000 mg | ORAL_TABLET | Freq: Four times a day (QID) | ORAL | 1 refills | Status: AC | PRN

## 2023-03-24 NOTE — Addendum Note (Signed)
Addended by: Corwin Levins on: 03/24/2023 04:41 PM   Modules accepted: Orders

## 2023-03-24 NOTE — Telephone Encounter (Signed)
Pt is requesting refill on Methocarbamol which was rx by Dr. Shelle Iron.Marland KitchenRaechel Chute

## 2023-03-25 ENCOUNTER — Telehealth: Payer: Self-pay | Admitting: Internal Medicine

## 2023-03-25 MED ORDER — IRBESARTAN 300 MG PO TABS: 300.0000 mg | ORAL_TABLET | Freq: Every day | ORAL | 3 refills | Status: DC

## 2023-03-25 MED ORDER — CALCIUM ACETATE (PHOS BINDER) 667 MG PO CAPS: ORAL_CAPSULE | ORAL | 3 refills | Status: DC

## 2023-03-25 NOTE — Telephone Encounter (Signed)
Sent refill to [facility]Shearon Stalls

## 2023-03-25 NOTE — Telephone Encounter (Signed)
Prescription Request  03/25/2023  LOV: 03/23/2023  What is the name of the medication or equipment? calcium acetate (PHOSLO) 667 MG capsule  irbesartan (AVAPRO) 300 MG tablet   Have you contacted your pharmacy to request a refill? Yes   Which pharmacy would you like this sent to?  [id number] [address], [place] - [address] [place] [address] Phone: [phone] Fax: [phone]    Patient notified that their request is being sent to the clinical staff for review and that they should receive a response within 2 business days.   Please advise at Mobile [phone] (mobile)

## 2023-03-26 DIAGNOSIS — H5202 Hypermetropia, left eye: Secondary | ICD-10-CM | POA: Diagnosis not present

## 2023-03-26 DIAGNOSIS — H52223 Regular astigmatism, bilateral: Secondary | ICD-10-CM | POA: Diagnosis not present

## 2023-03-26 DIAGNOSIS — E119 Type 2 diabetes mellitus without complications: Secondary | ICD-10-CM | POA: Diagnosis not present

## 2023-03-26 DIAGNOSIS — Z0100 Encounter for examination of eyes and vision without abnormal findings: Secondary | ICD-10-CM | POA: Diagnosis not present

## 2023-03-26 DIAGNOSIS — H524 Presbyopia: Secondary | ICD-10-CM | POA: Diagnosis not present

## 2023-03-26 DIAGNOSIS — H2513 Age-related nuclear cataract, bilateral: Secondary | ICD-10-CM | POA: Diagnosis not present

## 2023-04-06 DIAGNOSIS — M7062 Trochanteric bursitis, left hip: Secondary | ICD-10-CM | POA: Diagnosis not present

## 2023-04-06 DIAGNOSIS — Z6841 Body Mass Index (BMI) 40.0 and over, adult: Secondary | ICD-10-CM | POA: Diagnosis not present

## 2023-04-06 DIAGNOSIS — M1612 Unilateral primary osteoarthritis, left hip: Secondary | ICD-10-CM | POA: Diagnosis not present

## 2023-04-10 ENCOUNTER — Other Ambulatory Visit: Payer: Self-pay | Admitting: Pulmonary Disease

## 2023-04-10 ENCOUNTER — Encounter: Payer: Self-pay | Admitting: Pulmonary Disease

## 2023-04-10 ENCOUNTER — Ambulatory Visit: Payer: Medicare HMO | Admitting: Pulmonary Disease

## 2023-04-10 VITALS — BP 128/78 | HR 96 | Ht 67.0 in | Wt 267.2 lb

## 2023-04-10 DIAGNOSIS — G4733 Obstructive sleep apnea (adult) (pediatric): Secondary | ICD-10-CM | POA: Diagnosis not present

## 2023-04-10 DIAGNOSIS — J309 Allergic rhinitis, unspecified: Secondary | ICD-10-CM

## 2023-04-10 DIAGNOSIS — R053 Chronic cough: Secondary | ICD-10-CM

## 2023-04-10 DIAGNOSIS — R9389 Abnormal findings on diagnostic imaging of other specified body structures: Secondary | ICD-10-CM | POA: Diagnosis not present

## 2023-04-10 MED ORDER — QVAR REDIHALER 80 MCG/ACT IN AERB: 2.0000 | INHALATION_SPRAY | Freq: Every day | RESPIRATORY_TRACT | 0 refills | Status: DC

## 2023-04-10 NOTE — Assessment & Plan Note (Signed)
Tiny pulmonary nodules will need 1 year follow-up scan in April 2025

## 2023-04-10 NOTE — Progress Notes (Signed)
   Subjective:    Patient ID: Tanya [name], female    DOB: 12-25-1955, 67 y.o.   MRN: [id number]  HPI  66 yo ex smoker for fu of obstructive sleep apnea & mild int asthma .  She moved from [place] to [place] in2000.  PSG in [place] in 1997/98 @ [employer] (not available) >> severe obstructive sleep apnea & set up on nasal CPAP - supplier is [employer] [facility].  Asthma was diagnosed as a child , she grew out of this as an adult & is now back to using pro-air , 2 cannisters/ year.    She uses a mouth guard for bruxism   PMH -COVID infection 04/2021 She smoked about 10 pack years before she quit in 1989   Chief Complaint  Patient presents with   Follow-up    Pt f/u states that her cough is not getting any better, she is doing all the medications prescribed w/ no relief. Cough causes her to "gag" and "choke"   Seen for chronic cough since March 2023.  Treated serially for postnasal drip and reflux without relief.  HRCT chest did not show any evidence of ILD She was seen again for chronic cough by APP 02/2023 and given prednisone for 5 days .  She is taking Xyzal, chlorpheniramine at night and azelastine.  She is on Advair to use on an as-needed basis. She continues to have a barking cough, this is disruptive and socially awkward. She reports choking and gagging episodes on occasion. Only prednisone has seemed to help the cough.  She requests ENT consult and wonders if this is all related to sinus congestion and drainage   Significant tests/ events reviewed  HRCT chest 02/2023 no ILD,Pulmonary nodules in the left lower lobe measuring up to 7 mm.  Incidental finding of 1.6 cm thyroid nodule.   RAST neg AEC 100  Spirometry 08/21/2022 shows no evidence of airway obstruction, ratio 80, FEV1 82% and FVC 78%    Review of Systems neg for any significant sore throat, dysphagia, itching, sneezing, nasal congestion or excess/ purulent secretions, fever, chills, sweats, unintended wt loss, pleuritic  or exertional cp, hempoptysis, orthopnea pnd or change in chronic leg swelling. Also denies presyncope, palpitations, heartburn, abdominal pain, nausea, vomiting, diarrhea or change in bowel or urinary habits, dysuria,hematuria, rash, arthralgias, visual complaints, headache, numbness weakness or ataxia.     Objective:   Physical Exam  Gen. Pleasant, obese, in no distress, intermittent barking cough ENT - no lesions, no post nasal drip Neck: No JVD, no thyromegaly, no carotid bruits Lungs: no use of accessory muscles, no dullness to percussion, decreased without rales or rhonchi  Cardiovascular: Rhythm regular, heart sounds  normal, no murmurs or gallops, no peripheral edema Musculoskeletal: No deformities, no cyanosis or clubbing , no tremors       Assessment & Plan:

## 2023-04-10 NOTE — Assessment & Plan Note (Signed)
No etiology pinpointed but continue to favor postnasal drip and/or silent reflux. Her choking episodes also point to silent aspiration.  Will obtain limited CT of the sinuses and refer her to ENT at her request. She will continue Xyzal, Chlor-Trimeton and azelastine Since prednisone is the only medication that has helped.  I will trial inhaled steroid.  We gave her a prescription for Qvar but any other inhaled steroid such as Pulmicort or Flovent would be okay to trial for a month. If above ineffective then consider esophagram/pH probe

## 2023-04-10 NOTE — Assessment & Plan Note (Signed)
CPAP download was reviewed which shows excellent control of events on auto settings 8 to 20 cm with average pressure of 12.6 and maximum pressure of 14 cm.  She is very compliant without a single missed night.  CPAP is definitely helped improve her daytime somnolence and fatigue. Will tighten auto CPAP range to 8 to 15 cm  Weight loss encouraged, compliance with goal of at least 4-6 hrs every night is the expectation. Advised against medications with sedative side effects Cautioned against driving when sleepy - understanding that sleepiness will vary on a day to day basis

## 2023-04-10 NOTE — Patient Instructions (Addendum)
X ENT referral  X Limited CT sinuses  X Qvar 80 - 2 puffs daily, rinse mouth after use  Change to auto 8-15 cm

## 2023-04-13 ENCOUNTER — Ambulatory Visit
Admission: RE | Admit: 2023-04-13 | Discharge: 2023-04-13 | Disposition: A | Discharge status: Home / Self Care | Payer: Medicare HMO | Source: Ambulatory Visit | Attending: Internal Medicine | Admitting: Internal Medicine

## 2023-04-13 ENCOUNTER — Other Ambulatory Visit: Payer: Self-pay | Admitting: Internal Medicine

## 2023-04-13 ENCOUNTER — Other Ambulatory Visit: Payer: Self-pay

## 2023-04-13 DIAGNOSIS — E041 Nontoxic single thyroid nodule: Secondary | ICD-10-CM | POA: Diagnosis not present

## 2023-04-14 ENCOUNTER — Other Ambulatory Visit: Payer: Self-pay

## 2023-04-14 ENCOUNTER — Other Ambulatory Visit: Payer: Self-pay | Admitting: Internal Medicine

## 2023-04-14 NOTE — Telephone Encounter (Signed)
ARNUITY ELLIPTA 200 MCG/ACT AEPB        Changed from: beclomethasone (QVAR REDIHALER) 80 MCG/ACT inhaler   Sig: N/A   Disp: Not specified    Refills: 0   Start: 04/10/2023   Class: Normal   Non-formulary   Last ordered: 4 days ago (04/10/2023) by Oretha Milch, MD   Last refill: 04/10/2023   Rx #: [id number]   Pharmacy comment: Alternative Requested:PLEASE SEND COST EFFECTIVE ALTERNATIVE.    Dr. Vassie Loll, please advise.

## 2023-04-17 ENCOUNTER — Ambulatory Visit (HOSPITAL_COMMUNITY)
Admission: RE | Admit: 2023-04-17 | Discharge: 2023-04-17 | Disposition: A | Discharge status: Home / Self Care | Payer: Medicare HMO | Source: Ambulatory Visit | Attending: Pulmonary Disease | Admitting: Pulmonary Disease

## 2023-04-17 DIAGNOSIS — R053 Chronic cough: Secondary | ICD-10-CM | POA: Diagnosis not present

## 2023-04-17 DIAGNOSIS — J342 Deviated nasal septum: Secondary | ICD-10-CM | POA: Diagnosis not present

## 2023-04-17 DIAGNOSIS — J309 Allergic rhinitis, unspecified: Secondary | ICD-10-CM | POA: Insufficient documentation

## 2023-04-20 DIAGNOSIS — G4733 Obstructive sleep apnea (adult) (pediatric): Secondary | ICD-10-CM | POA: Diagnosis not present

## 2023-05-05 DIAGNOSIS — N3281 Overactive bladder: Secondary | ICD-10-CM | POA: Diagnosis not present

## 2023-05-05 DIAGNOSIS — Z01419 Encounter for gynecological examination (general) (routine) without abnormal findings: Secondary | ICD-10-CM | POA: Diagnosis not present

## 2023-05-08 DIAGNOSIS — K219 Gastro-esophageal reflux disease without esophagitis: Secondary | ICD-10-CM | POA: Diagnosis not present

## 2023-05-08 DIAGNOSIS — J309 Allergic rhinitis, unspecified: Secondary | ICD-10-CM | POA: Diagnosis not present

## 2023-05-08 DIAGNOSIS — R053 Chronic cough: Secondary | ICD-10-CM | POA: Diagnosis not present

## 2023-05-21 DIAGNOSIS — G4733 Obstructive sleep apnea (adult) (pediatric): Secondary | ICD-10-CM | POA: Diagnosis not present

## 2023-06-09 DIAGNOSIS — L820 Inflamed seborrheic keratosis: Secondary | ICD-10-CM | POA: Diagnosis not present

## 2023-06-09 DIAGNOSIS — D225 Melanocytic nevi of trunk: Secondary | ICD-10-CM | POA: Diagnosis not present

## 2023-06-09 DIAGNOSIS — D235 Other benign neoplasm of skin of trunk: Secondary | ICD-10-CM | POA: Diagnosis not present

## 2023-06-09 DIAGNOSIS — Z85820 Personal history of malignant melanoma of skin: Secondary | ICD-10-CM | POA: Diagnosis not present

## 2023-06-09 DIAGNOSIS — L821 Other seborrheic keratosis: Secondary | ICD-10-CM | POA: Diagnosis not present

## 2023-06-09 DIAGNOSIS — D485 Neoplasm of uncertain behavior of skin: Secondary | ICD-10-CM | POA: Diagnosis not present

## 2023-06-16 DIAGNOSIS — G5131 Clonic hemifacial spasm, right: Secondary | ICD-10-CM | POA: Diagnosis not present

## 2023-06-20 DIAGNOSIS — G4733 Obstructive sleep apnea (adult) (pediatric): Secondary | ICD-10-CM | POA: Diagnosis not present

## 2023-06-30 DIAGNOSIS — D2361 Other benign neoplasm of skin of right upper limb, including shoulder: Secondary | ICD-10-CM | POA: Diagnosis not present

## 2023-07-13 ENCOUNTER — Encounter: Payer: Self-pay | Admitting: Adult Health

## 2023-07-13 ENCOUNTER — Ambulatory Visit: Payer: Medicare HMO | Admitting: Adult Health

## 2023-07-13 VITALS — BP 136/80 | HR 97 | Temp 98.4°F | Ht 67.0 in | Wt 272.6 lb

## 2023-07-13 DIAGNOSIS — R053 Chronic cough: Secondary | ICD-10-CM | POA: Diagnosis not present

## 2023-07-13 DIAGNOSIS — J4530 Mild persistent asthma, uncomplicated: Secondary | ICD-10-CM | POA: Diagnosis not present

## 2023-07-13 DIAGNOSIS — J309 Allergic rhinitis, unspecified: Secondary | ICD-10-CM | POA: Diagnosis not present

## 2023-07-13 DIAGNOSIS — G4733 Obstructive sleep apnea (adult) (pediatric): Secondary | ICD-10-CM

## 2023-07-13 NOTE — Assessment & Plan Note (Signed)
Mild asthma versus cough variant asthma triggered by postnasal drainage and GERD/LPR-improved control on Arnuity and aggressive maintenance regimen  Plan  Patient Instructions  Continue on CPAP at bedtime Work on healthy weight Do not drive if sleepy  Arnuity 1 puff daily , rinse after.  Dymista 1 puff Twice daily   Robitussin DM 2 tsp every 4hr for cough As needed   Chlor tabs 4mg  (Chlorpheniramine ) 2 At bedtime  As needed   Continue on Prilosec twice daily Albuterol inhaler As needed   Xyzal daily.  Continue on Singulair daily. Sips of water to soothe throat, avoid throat clearing and cough.  NO MINTS  Voice rest and no singing.   Follow-up in 4 months with Dr. Vassie Loll  or Donavyn Fecher NP and As needed   Please contact office for sooner follow up if symptoms do not improve or worsen or seek emergency care

## 2023-07-13 NOTE — Progress Notes (Signed)
@Patient  ID: Tanya [name], female    DOB: 10-05-1956, 67 y.o.   MRN: [id number]  Chief Complaint  Patient presents with   Follow-up    Referring provider: Corwin Levins, MD  HPI: 67 year old female former smoker followed for obstructive sleep apnea, mild intermittent asthma and chronic cough  TEST/EVENTS :  Spirometry 08/21/2022 shows no evidence of airway obstruction, ratio 80, FEV1 82% and FVC 78%    Chest x-ray August 21, 2022 clear lungs   CBC with differential April 2023, eosinophil absolute count 100  07/13/2023 Follow up: OSA and Asthma  Patient presents for 31-month follow-up.  Patient has mild intermittent asthma and chronic cough.  Has previously been tried on Advair and Symbicort without any perceived benefit.  Placed on aggressive cough control regimen with Zyrtec, Protonix, Pepcid, Delsym and Tessalon, Dymista.   She was treated with short course of steroids without any significant benefit.  High-resolution CT chest was negative for interstitial lung disease.  Lab work showed negative allergen panel, normal IgE and absolute eosinophil count at 100.  CT sinus was negative last visit was referred to ENT.  Started on Arnuity.  ENT notes on May 08, 2023 reviewed with laryngoscopy showed mild post glottic edema consistent with LPR.  She was recommended to increase PPI to twice daily.  Recommended to stop singing [employer]) And referred to speech center for voice evaluation. Speech therapy is pending. Cough is much better. Decreased in frequency and intensity.   Patient has obstructive sleep apnea.  She remains on CPAP at bedtime.  Patient says she wears it every single night and feels that she benefits from CPAP with decreased daytime sleepiness.  CPAP download shows excellent compliance with 100% usage.  Daily average usage around 6 hours.  Patient is on AutoSet 8 to 20 cm H2O.  Daily average pressure at 11.2 cm H2O.  AHI 0.9/hour.  She uses a nasal mask.   Goes to  [place] for the winter. [place].  Recently got new rescue dog.   Allergies  Allergen Reactions   Ace Inhibitors Hives   Amoxicillin Hives   Other Hives    Nucafed   Penicillins Hives    Has patient had a PCN reaction causing immediate rash, facial/tongue/throat swelling, SOB or lightheadedness with hypotension: Yes Has patient had a PCN reaction causing severe rash involving mucus membranes or skin necrosis: No Has patient had a PCN reaction that required hospitalization No Has patient had a PCN reaction occurring within the last 10 years: No If all of the above answers are "NO", then may proceed with Cephalosporin use.    Metformin And Related Other (See Comments)    GI upset   Wound Dressing Adhesive Other (See Comments)    Tore her skin   Pseudoephedrine Hives   Pseudoephedrine-Codeine Hives    Immunization History  Administered Date(s) Administered   H1N1 10/30/2008   Influenza Nasal 08/16/2016   Influenza Split 08/24/2019   Influenza Whole 08/24/1998, 07/25/2009, 08/24/2010, 08/24/2012   Influenza,inj,Quad PF,6+ Mos 10/02/2018, 08/22/2019, 08/10/2020, 09/18/2022   Influenza,inj,Quad PF,6-35 Mos 09/06/2015   Influenza,inj,quad, With Preservative 08/20/2016, 09/12/2017   Influenza-Unspecified 09/03/2013, 08/22/2016, 09/23/2017, 10/02/2018   PFIZER Comirnaty(Gray Top)Covid-19 Tri-Sucrose Vaccine 02/12/2020, 03/04/2020   PNEUMOCOCCAL CONJUGATE-20 03/23/2023   Pneumococcal Conjugate-13 02/08/2016   Pneumococcal Polysaccharide-23 01/11/2018   Td 11/25/1995, 11/24/2005   Tdap 02/08/2016   Zoster Recombinant(Shingrix) 04/06/2018, 10/09/2018    Past Medical History:  Diagnosis Date   Allergic rhinitis    Blepharospasm 10/09/2010  Cancer (HCC)    Skin   Diabetes mellitus without complication (HCC)    Hip pain    History of melanoma excision    MULTIPLE SITES   Hypertension    Macular pucker 04/2021   Left   Mild intermittent asthma    OA (osteoarthritis)     PMB (postmenopausal bleeding)    PONV (postoperative nausea and vomiting)    did not experience after D&C, request to use same medicaitons from that procedure   Precancerous changes of the cervix    Sleep apnea    tested in home around 2 yrs ago   Varicose vein    wears compression stocking    Tobacco History: Social History   Tobacco Use  Smoking Status Former   Current packs/day: 0.00   Average packs/day: 2.0 packs/day for 10.0 years (20.0 ttl pk-yrs)   Types: Cigarettes   Start date: 11/24/1977   Quit date: 11/25/1987   Years since quitting: 35.6  Smokeless Tobacco Never   Counseling given: Not Answered   Outpatient Medications Prior to Visit  Medication Sig Dispense Refill   albuterol (VENTOLIN HFA) 108 (90 Base) MCG/ACT inhaler Inhale 2 puffs into the lungs every 6 (six) hours as needed for wheezing or shortness of breath. 18 g 2   amLODipine (NORVASC) 10 MG tablet TAKE 1 TABLET BY MOUTH EVERY DAY 90 tablet 3   aspirin 81 MG tablet Take 1 tablet (81 mg total) by mouth daily. 60 tablet 1   Azelastine-Fluticasone 137-50 MCG/ACT SUSP Place 1 puff into the nose 2 (two) times daily. 1 g 5   calcium acetate (PHOSLO) 667 MG capsule TAKE 1 CAPSULE (667 MG TOTAL) BY MOUTH ONCE DAILY 90 capsule 3   celecoxib (CELEBREX) 200 MG capsule Take 1 capsule (200 mg total) by mouth 2 (two) times daily as needed. 60 capsule 5   Fluticasone Furoate (ARNUITY ELLIPTA) 200 MCG/ACT AEPB Inhale 200 mcg into the lungs daily. 1 each 3   fluticasone-salmeterol (WIXELA INHUB) 250-50 MCG/ACT AEPB Inhale 1 puff into the lungs 2 (two) times daily.     glipiZIDE (GLUCOTROL XL) 10 MG 24 hr tablet Take 1 tablet (10 mg total) by mouth daily with breakfast. 90 tablet 3   irbesartan (AVAPRO) 300 MG tablet Take 1 tablet (300 mg total) by mouth daily. 90 tablet 3   methocarbamol (ROBAXIN) 500 MG tablet Take 1 tablet (500 mg total) by mouth every 6 (six) hours as needed for muscle spasms. 60 tablet 1   montelukast  (SINGULAIR) 10 MG tablet TAKE 1 TABLET BY MOUTH EVERY DAY 30 tablet 11   Multiple Vitamin (MULTIVITAMIN WITH MINERALS) TABS tablet Take 1 tablet by mouth daily.     omeprazole (PRILOSEC) 40 MG capsule Take 40 mg by mouth 2 (two) times daily.     oxybutynin (DITROPAN-XL) 10 MG 24 hr tablet oxybutynin chloride ER 10 mg tablet,extended release 24 hr 90 tablet 3   predniSONE (DELTASONE) 20 MG tablet Take 1 tablet (20 mg total) by mouth daily with breakfast. 5 tablet 0   pantoprazole (PROTONIX) 40 MG tablet Take 1 tablet (40 mg total) by mouth daily. 30 tablet 3   No facility-administered medications prior to visit.     Review of Systems:   Constitutional:   No  weight loss, night sweats,  Fevers, chills, fatigue, or  lassitude.  HEENT:   No headaches,  Difficulty swallowing,  Tooth/dental problems, or  Sore throat,  No sneezing, itching, ear ache,  +nasal congestion, post nasal drip,   CV:  No chest pain,  Orthopnea, PND, swelling in lower extremities, anasarca, dizziness, palpitations, syncope.   GI  No heartburn, indigestion, abdominal pain, nausea, vomiting, diarrhea, change in bowel habits, loss of appetite, bloody stools.   Resp: .  No chest wall deformity  Skin: no rash or lesions.  GU: no dysuria, change in color of urine, no urgency or frequency.  No flank pain, no hematuria   MS:  No joint pain or swelling.  No decreased range of motion.  No back pain.    Physical Exam  BP 136/80 (BP Location: Left Arm, Patient Position: Sitting, Cuff Size: Large)   Pulse 97   Temp 98.4 F (36.9 C) (Oral)   Ht 5\' 7"  (1.702 m)   Wt 272 lb 9.6 oz (123.7 kg)   LMP 09/13/2013   SpO2 97%   BMI 42.70 kg/m   GEN: A/Ox3; pleasant , NAD, well nourished    HEENT:  [place]/AT,  , NOSE-clear, THROAT-clear, no lesions, no postnasal drip or exudate noted.   NECK:  Supple w/ fair ROM; no JVD; normal carotid impulses w/o bruits; no thyromegaly or nodules palpated; no lymphadenopathy.     RESP  Clear  P & A; w/o, wheezes/ rales/ or rhonchi. no accessory muscle use, no dullness to percussion  CARD:  RRR, no m/r/g, tr peripheral edema, pulses intact, no cyanosis or clubbing.  GI:   Soft & nt; nml bowel sounds; no organomegaly or masses detected.   Musco: Warm bil, no deformities or joint swelling noted.   Neuro: alert, no focal deficits noted.    Skin: Warm, no lesions or rashes    Lab Results:  CBC    Component Value Date/Time   WBC 5.8 12/17/2022 0943   RBC 4.77 12/17/2022 0943   HGB 14.4 12/17/2022 0943   HCT 42.7 12/17/2022 0943   PLT 195.0 12/17/2022 0943   MCV 89.5 12/17/2022 0943   MCH 29.8 05/17/2021 0307   MCHC 33.6 12/17/2022 0943   RDW 13.9 12/17/2022 0943   LYMPHSABS 1.6 12/17/2022 0943   MONOABS 0.4 12/17/2022 0943   EOSABS 0.1 12/17/2022 0943   BASOSABS 0.0 12/17/2022 0943    BMET    Component Value Date/Time   NA 140 03/12/2023 0801   K 4.3 03/12/2023 0801   CL 105 03/12/2023 0801   CO2 27 03/12/2023 0801   GLUCOSE 150 (H) 03/12/2023 0801   BUN 15 03/12/2023 0801   CREATININE 0.69 03/12/2023 0801   CALCIUM 9.5 03/12/2023 0801   GFRNONAA >60 05/16/2021 0310   GFRAA >60 10/04/2016 0610    BNP No results found for: "BNP"  ProBNP No results found for: "PROBNP"  Imaging: No results found.  Administration History     None           No data to display          No results found for: "NITRICOXIDE"      Assessment & Plan:   Obstructive sleep apnea Excellent control and compliance on nocturnal CPAP.  No changes. Plan  Patient Instructions  Continue on CPAP at bedtime Work on healthy weight Do not drive if sleepy  Arnuity 1 puff daily , rinse after.  Dymista 1 puff Twice daily   Robitussin DM 2 tsp every 4hr for cough As needed   Chlor tabs 4mg  (Chlorpheniramine ) 2 At bedtime  As needed   Continue on Prilosec twice daily  Albuterol inhaler As needed   Xyzal daily.  Continue on Singulair daily. Sips of  water to soothe throat, avoid throat clearing and cough.  NO MINTS  Voice rest and no singing.   Follow-up in 4 months with Dr. Vassie Loll  or Kresta Templeman NP and As needed   Please contact office for sooner follow up if symptoms do not improve or worsen or seek emergency care     Asthma Mild asthma versus cough variant asthma triggered by postnasal drainage and GERD/LPR-improved control on Arnuity and aggressive maintenance regimen  Plan  Patient Instructions  Continue on CPAP at bedtime Work on healthy weight Do not drive if sleepy  Arnuity 1 puff daily , rinse after.  Dymista 1 puff Twice daily   Robitussin DM 2 tsp every 4hr for cough As needed   Chlor tabs 4mg  (Chlorpheniramine ) 2 At bedtime  As needed   Continue on Prilosec twice daily Albuterol inhaler As needed   Xyzal daily.  Continue on Singulair daily. Sips of water to soothe throat, avoid throat clearing and cough.  NO MINTS  Voice rest and no singing.   Follow-up in 4 months with Dr. Vassie Loll  or Robecca Fulgham NP and As needed   Please contact office for sooner follow up if symptoms do not improve or worsen or seek emergency care     Allergic rhinitis Improved control on current maintenance regimen  Chronic cough Chronic cough improved on treatment aimed at postnasal drainage and reflux.  Speech therapy consult is pending.  Patient is continue with voice rest and no singing.  Plan  Patient Instructions  Continue on CPAP at bedtime Work on healthy weight Do not drive if sleepy  Arnuity 1 puff daily , rinse after.  Dymista 1 puff Twice daily   Robitussin DM 2 tsp every 4hr for cough As needed   Chlor tabs 4mg  (Chlorpheniramine ) 2 At bedtime  As needed   Continue on Prilosec twice daily Albuterol inhaler As needed   Xyzal daily.  Continue on Singulair daily. Sips of water to soothe throat, avoid throat clearing and cough.  NO MINTS  Voice rest and no singing.   Follow-up in 4 months with Dr. Vassie Loll  or Khrystina Bonnes NP and As  needed   Please contact office for sooner follow up if symptoms do not improve or worsen or seek emergency care       Rubye Oaks, NP 07/13/2023

## 2023-07-13 NOTE — Patient Instructions (Addendum)
Continue on CPAP at bedtime Work on healthy weight Do not drive if sleepy  Arnuity 1 puff daily , rinse after.  Dymista 1 puff Twice daily   Robitussin DM 2 tsp every 4hr for cough As needed   Chlor tabs 4mg  (Chlorpheniramine ) 2 At bedtime  As needed   Continue on Prilosec twice daily Albuterol inhaler As needed   Xyzal daily.  Continue on Singulair daily. Sips of water to soothe throat, avoid throat clearing and cough.  NO MINTS  Voice rest and no singing.   Follow-up in 4 months with Dr. Vassie Loll  or Shalynn Jorstad NP and As needed   Please contact office for sooner follow up if symptoms do not improve or worsen or seek emergency care

## 2023-07-13 NOTE — Assessment & Plan Note (Signed)
Chronic cough improved on treatment aimed at postnasal drainage and reflux.  Speech therapy consult is pending.  Patient is continue with voice rest and no singing.  Plan  Patient Instructions  Continue on CPAP at bedtime Work on healthy weight Do not drive if sleepy  Arnuity 1 puff daily , rinse after.  Dymista 1 puff Twice daily   Robitussin DM 2 tsp every 4hr for cough As needed   Chlor tabs 4mg  (Chlorpheniramine ) 2 At bedtime  As needed   Continue on Prilosec twice daily Albuterol inhaler As needed   Xyzal daily.  Continue on Singulair daily. Sips of water to soothe throat, avoid throat clearing and cough.  NO MINTS  Voice rest and no singing.   Follow-up in 4 months with Dr. Vassie Loll  or Terrilyn Tyner NP and As needed   Please contact office for sooner follow up if symptoms do not improve or worsen or seek emergency care

## 2023-07-13 NOTE — Assessment & Plan Note (Signed)
Improved control on current maintenance regimen

## 2023-07-13 NOTE — Assessment & Plan Note (Signed)
Excellent control and compliance on nocturnal CPAP.  No changes. Plan  Patient Instructions  Continue on CPAP at bedtime Work on healthy weight Do not drive if sleepy  Arnuity 1 puff daily , rinse after.  Dymista 1 puff Twice daily   Robitussin DM 2 tsp every 4hr for cough As needed   Chlor tabs 4mg  (Chlorpheniramine ) 2 At bedtime  As needed   Continue on Prilosec twice daily Albuterol inhaler As needed   Xyzal daily.  Continue on Singulair daily. Sips of water to soothe throat, avoid throat clearing and cough.  NO MINTS  Voice rest and no singing.   Follow-up in 4 months with Dr. Vassie Loll  or Minas Bonser NP and As needed   Please contact office for sooner follow up if symptoms do not improve or worsen or seek emergency care

## 2023-07-14 DIAGNOSIS — D2361 Other benign neoplasm of skin of right upper limb, including shoulder: Secondary | ICD-10-CM | POA: Diagnosis not present

## 2023-07-21 DIAGNOSIS — G4733 Obstructive sleep apnea (adult) (pediatric): Secondary | ICD-10-CM | POA: Diagnosis not present

## 2023-08-04 DIAGNOSIS — J309 Allergic rhinitis, unspecified: Secondary | ICD-10-CM | POA: Diagnosis not present

## 2023-08-04 DIAGNOSIS — J385 Laryngeal spasm: Secondary | ICD-10-CM | POA: Diagnosis not present

## 2023-08-04 DIAGNOSIS — J383 Other diseases of vocal cords: Secondary | ICD-10-CM | POA: Diagnosis not present

## 2023-08-04 DIAGNOSIS — J387 Other diseases of larynx: Secondary | ICD-10-CM | POA: Insufficient documentation

## 2023-08-04 DIAGNOSIS — R053 Chronic cough: Secondary | ICD-10-CM | POA: Diagnosis not present

## 2023-08-04 DIAGNOSIS — K219 Gastro-esophageal reflux disease without esophagitis: Secondary | ICD-10-CM | POA: Diagnosis not present

## 2023-08-20 DIAGNOSIS — H18413 Arcus senilis, bilateral: Secondary | ICD-10-CM | POA: Diagnosis not present

## 2023-08-20 DIAGNOSIS — H25013 Cortical age-related cataract, bilateral: Secondary | ICD-10-CM | POA: Diagnosis not present

## 2023-08-20 DIAGNOSIS — H2512 Age-related nuclear cataract, left eye: Secondary | ICD-10-CM | POA: Diagnosis not present

## 2023-08-20 DIAGNOSIS — H25043 Posterior subcapsular polar age-related cataract, bilateral: Secondary | ICD-10-CM | POA: Diagnosis not present

## 2023-08-20 DIAGNOSIS — H2513 Age-related nuclear cataract, bilateral: Secondary | ICD-10-CM | POA: Diagnosis not present

## 2023-08-21 DIAGNOSIS — G4733 Obstructive sleep apnea (adult) (pediatric): Secondary | ICD-10-CM | POA: Diagnosis not present

## 2023-08-26 DIAGNOSIS — K219 Gastro-esophageal reflux disease without esophagitis: Secondary | ICD-10-CM | POA: Diagnosis not present

## 2023-08-26 DIAGNOSIS — J309 Allergic rhinitis, unspecified: Secondary | ICD-10-CM | POA: Diagnosis not present

## 2023-08-26 DIAGNOSIS — R053 Chronic cough: Secondary | ICD-10-CM | POA: Diagnosis not present

## 2023-08-26 DIAGNOSIS — J383 Other diseases of vocal cords: Secondary | ICD-10-CM | POA: Diagnosis not present

## 2023-08-26 DIAGNOSIS — J387 Other diseases of larynx: Secondary | ICD-10-CM | POA: Diagnosis not present

## 2023-08-26 DIAGNOSIS — J385 Laryngeal spasm: Secondary | ICD-10-CM | POA: Diagnosis not present

## 2023-09-09 DIAGNOSIS — G5131 Clonic hemifacial spasm, right: Secondary | ICD-10-CM | POA: Diagnosis not present

## 2023-09-09 DIAGNOSIS — G4733 Obstructive sleep apnea (adult) (pediatric): Secondary | ICD-10-CM | POA: Diagnosis not present

## 2023-09-15 ENCOUNTER — Other Ambulatory Visit (INDEPENDENT_AMBULATORY_CARE_PROVIDER_SITE_OTHER): Payer: Medicare HMO

## 2023-09-15 DIAGNOSIS — E559 Vitamin D deficiency, unspecified: Secondary | ICD-10-CM

## 2023-09-15 DIAGNOSIS — E1165 Type 2 diabetes mellitus with hyperglycemia: Secondary | ICD-10-CM | POA: Diagnosis not present

## 2023-09-15 LAB — BASIC METABOLIC PANEL
BUN: 17 mg/dL (ref 6–23)
CO2: 26 meq/L (ref 19–32)
Calcium: 9.4 mg/dL (ref 8.4–10.5)
Chloride: 105 meq/L (ref 96–112)
Creatinine, Ser: 0.69 mg/dL (ref 0.40–1.20)
GFR: 90.96 mL/min (ref >60.00)
Glucose, Bld: 164 mg/dL — ABNORMAL HIGH (ref 70–99)
Potassium: 4.1 meq/L (ref 3.5–5.1)
Sodium: 142 meq/L (ref 135–145)

## 2023-09-15 LAB — LIPID PANEL
Cholesterol: 144 mg/dL (ref 0–200)
HDL: 51.7 mg/dL (ref >39.00)
LDL Cholesterol: 69 mg/dL (ref 0–99)
NonHDL: 91.81
Total CHOL/HDL Ratio: 3
Triglycerides: 114 mg/dL (ref 0.0–149.0)
VLDL: 22.8 mg/dL (ref 0.0–40.0)

## 2023-09-15 LAB — HEPATIC FUNCTION PANEL
ALT: 40 U/L — ABNORMAL HIGH (ref 0–35)
AST: 26 U/L (ref 0–37)
Albumin: 4.1 g/dL (ref 3.5–5.2)
Alkaline Phosphatase: 90 U/L (ref 39–117)
Bilirubin, Direct: 0.1 mg/dL (ref 0.0–0.3)
Total Bilirubin: 0.6 mg/dL (ref 0.2–1.2)
Total Protein: 6.9 g/dL (ref 6.0–8.3)

## 2023-09-15 LAB — HEMOGLOBIN A1C: Hgb A1c MFr Bld: 7.5 % — ABNORMAL HIGH (ref 4.6–6.5)

## 2023-09-15 LAB — VITAMIN D 25 HYDROXY (VIT D DEFICIENCY, FRACTURES): VITD: 36.16 ng/mL (ref 30.00–100.00)

## 2023-09-20 DIAGNOSIS — G4733 Obstructive sleep apnea (adult) (pediatric): Secondary | ICD-10-CM | POA: Diagnosis not present

## 2023-09-21 ENCOUNTER — Other Ambulatory Visit: Payer: Self-pay | Admitting: Pulmonary Disease

## 2023-09-22 ENCOUNTER — Ambulatory Visit: Payer: Medicare HMO | Admitting: Internal Medicine

## 2023-09-22 ENCOUNTER — Ambulatory Visit (INDEPENDENT_AMBULATORY_CARE_PROVIDER_SITE_OTHER)
Admission: RE | Admit: 2023-09-22 | Discharge: 2023-09-22 | Disposition: A | Discharge status: Home / Self Care | Payer: Medicare HMO | Source: Ambulatory Visit | Attending: Internal Medicine | Admitting: Internal Medicine

## 2023-09-22 ENCOUNTER — Encounter: Payer: Self-pay | Admitting: Internal Medicine

## 2023-09-22 VITALS — BP 138/82 | HR 92 | Temp 98.2°F | Ht 67.0 in | Wt 271.0 lb

## 2023-09-22 DIAGNOSIS — Z7984 Long term (current) use of oral hypoglycemic drugs: Secondary | ICD-10-CM | POA: Diagnosis not present

## 2023-09-22 DIAGNOSIS — Z23 Encounter for immunization: Secondary | ICD-10-CM | POA: Diagnosis not present

## 2023-09-22 DIAGNOSIS — E2839 Other primary ovarian failure: Secondary | ICD-10-CM

## 2023-09-22 DIAGNOSIS — E66813 Obesity, class 3: Secondary | ICD-10-CM

## 2023-09-22 DIAGNOSIS — E559 Vitamin D deficiency, unspecified: Secondary | ICD-10-CM

## 2023-09-22 DIAGNOSIS — E1165 Type 2 diabetes mellitus with hyperglycemia: Secondary | ICD-10-CM

## 2023-09-22 DIAGNOSIS — Z6841 Body Mass Index (BMI) 40.0 and over, adult: Secondary | ICD-10-CM

## 2023-09-22 DIAGNOSIS — I10 Essential (primary) hypertension: Secondary | ICD-10-CM | POA: Diagnosis not present

## 2023-09-22 MED ORDER — TOPIRAMATE 50 MG PO TABS: 50.0000 mg | ORAL_TABLET | Freq: Two times a day (BID) | ORAL | 1 refills | Status: DC

## 2023-09-22 MED ORDER — PHENTERMINE HCL 30 MG PO CAPS: 30.0000 mg | ORAL_CAPSULE | ORAL | 1 refills | Status: DC

## 2023-09-22 NOTE — Patient Instructions (Addendum)
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  Please have the pharmacy call with any other refills you may need.  Please continue your efforts at being more active, low cholesterol diet, and weight control.  Please keep your appointments with your specialists as you may have planned  Please go to the LAB at the blood drawing area for the tests to be done  You will be contacted by phone if any changes need to be made immediately.  Otherwise, you will receive a letter about your results with an explanation, but please check with MyChart first.  Please make an Appointment to return in 6 months, or sooner if needed

## 2023-09-22 NOTE — Progress Notes (Signed)
Patient ID: Tanya [name], female   DOB: 10-15-1956, 67 y.o.   MRN: [id number]        Chief Complaint: follow up HTN, HLD and obesity, DM, low vit d       HPI:  Tanya [name] is a 67 y.o. female here overall doing ok,  Pt denies chest pain, increased sob or doe, wheezing, orthopnea, PND, increased LE swelling, palpitations, dizziness or syncope.   Pt denies polydipsia, polyuria, or new focal neuro s/s.   Reviewed for urgency; pt with upcoming appt  Due for dxa.,  Difficult to lose wt.  Due for flu shot.         Wt Readings from Last 3 Encounters:  09/22/23 271 lb (122.9 kg)  07/13/23 272 lb 9.6 oz (123.7 kg)  04/10/23 267 lb 3.2 oz (121.2 kg)   BP Readings from Last 3 Encounters:  09/22/23 138/82  07/13/23 136/80  04/10/23 128/78         Past Medical History:  Diagnosis Date   Allergic rhinitis    Blepharospasm 10/09/2010   Cancer (HCC)    Skin   Diabetes mellitus without complication (HCC)    Hip pain    History of melanoma excision    MULTIPLE SITES   Hypertension    Macular pucker 04/2021   Left   Mild intermittent asthma    OA (osteoarthritis)    PMB (postmenopausal bleeding)    PONV (postoperative nausea and vomiting)    did not experience after D&C, request to use same medicaitons from that procedure   Precancerous changes of the cervix    Sleep apnea    tested in home around 2 yrs ago   Varicose vein    wears compression stocking   Past Surgical History:  Procedure Laterality Date   ABDOMINAL HYSTERECTOMY     COLONOSCOPY     DILATION AND CURETTAGE OF UTERUS     ESOPHAGOGASTRODUODENOSCOPY  10/14/2004   HAMMER TOE SURGERY     HYSTEROSCOPY WITH D & C N/A 04/07/2016   Procedure: DILATATION AND CURETTAGE /HYSTEROSCOPY WITH MYOSURE POLYPECTOMY;  Surgeon: Lavina Hamman, MD;  Location: [facility];  Service: Gynecology;  Laterality: N/A;   JOINT REPLACEMENT     KNEE ARTHROSCOPY Bilateral left 08-01-2005/  right   LAPAROSCOPIC HYSTERECTOMY N/A  06/02/2016   Procedure: HYSTERECTOMY TOTAL LAPAROSCOPIC;  Surgeon: Lavina Hamman, MD;  Location: [facility];  Service: Gynecology;  Laterality: N/A;   LAPAROSCOPIC SALPINGO OOPHERECTOMY Bilateral 06/02/2016   Procedure: LAPAROSCOPIC SALPINGO OOPHORECTOMY;  Surgeon: Lavina Hamman, MD;  Location: [facility];  Service: Gynecology;  Laterality: Bilateral;   RE-DO ROTATOR CUFF REPAIR/  SAD/  ACROMIOPLASTY Right 01-04-2009   SHOULDER ARTHROSCOPY W/ ROTATOR CUFF REPAIR Right    tailor bunion removal     TONSILLECTOMY     TOTAL HIP ARTHROPLASTY Right 09/12/2016   Procedure: RIGHT TOTAL HIP ARTHROPLASTY ANTERIOR APPROACH;  Surgeon: Samson Frederic, MD;  Location: [facility];  Service: Orthopedics;  Laterality: Right;   TOTAL HIP ARTHROPLASTY Right 10/01/2016   Procedure: IRRIGATION AND DEBRIDEMENT RIGHT HIP;  Surgeon: Samson Frederic, MD;  Location: [facility];  Service: Orthopedics;  Laterality: Right;   TOTAL KNEE ARTHROPLASTY Left 12/29/2013   Procedure: LEFT TOTAL KNEE ARTHROPLASTY;  Surgeon: Javier Docker, MD;  Location: [facility];  Service: Orthopedics;  Laterality: Left;   TOTAL KNEE ARTHROPLASTY Right 05/15/2021   Procedure: TOTAL KNEE ARTHROPLASTY;  Surgeon: Jene Every, MD;  Location: [facility];  Service: Orthopedics;  Laterality:  Right;  2.5 hrs    reports that she quit smoking about 35 years ago. Her smoking use included cigarettes. She started smoking about 45 years ago. She has a 20 pack-year smoking history. She has never used smokeless tobacco. She reports current alcohol use of about 2.0 standard drinks of alcohol per week. She reports that she does not use drugs. family history includes Asthma in an other family member; Diabetes in an other family member; Esophageal cancer in her father; Melanoma in her sister; Ovarian cancer in an other family member. Allergies  Allergen Reactions   Ace Inhibitors Hives   Amoxicillin Hives   Other Hives    Nucafed   Penicillins Hives    Has patient had a PCN reaction  causing immediate rash, facial/tongue/throat swelling, SOB or lightheadedness with hypotension: Yes Has patient had a PCN reaction causing severe rash involving mucus membranes or skin necrosis: No Has patient had a PCN reaction that required hospitalization No Has patient had a PCN reaction occurring within the last 10 years: No If all of the above answers are "NO", then may proceed with Cephalosporin use.    Propylene Glycol Itching    Itching of eyes/lashes   Metformin And Related Other (See Comments)    GI upset   Wound Dressing Adhesive Other (See Comments)    Tore her skin   Wound Dressings Other (See Comments)    Tore her skin   Pseudoephedrine Hives   Pseudoephedrine-Codeine Hives   Current Outpatient Medications on File Prior to Visit  Medication Sig Dispense Refill   albuterol (VENTOLIN HFA) 108 (90 Base) MCG/ACT inhaler Inhale 2 puffs into the lungs every 6 (six) hours as needed for wheezing or shortness of breath. 18 g 2   amLODipine (NORVASC) 10 MG tablet TAKE 1 TABLET BY MOUTH EVERY DAY 90 tablet 3   ARNUITY ELLIPTA 200 MCG/ACT AEPB INHALE 200 MCG INTO THE LUNGS DAILY. 90 each 1   aspirin 81 MG tablet Take 1 tablet (81 mg total) by mouth daily. 60 tablet 1   Azelastine-Fluticasone 137-50 MCG/ACT SUSP Place 1 puff into the nose 2 (two) times daily. 1 g 5   calcium acetate (PHOSLO) 667 MG capsule TAKE 1 CAPSULE (667 MG TOTAL) BY MOUTH ONCE DAILY 90 capsule 3   celecoxib (CELEBREX) 200 MG capsule Take 1 capsule (200 mg total) by mouth 2 (two) times daily as needed. 60 capsule 5   fluticasone-salmeterol (WIXELA INHUB) 250-50 MCG/ACT AEPB Inhale 1 puff into the lungs 2 (two) times daily.     glipiZIDE (GLUCOTROL XL) 10 MG 24 hr tablet Take 1 tablet (10 mg total) by mouth daily with breakfast. 90 tablet 3   irbesartan (AVAPRO) 300 MG tablet Take 1 tablet (300 mg total) by mouth daily. 90 tablet 3   methocarbamol (ROBAXIN) 500 MG tablet Take 1 tablet (500 mg total) by mouth every  6 (six) hours as needed for muscle spasms. 60 tablet 1   montelukast (SINGULAIR) 10 MG tablet TAKE 1 TABLET BY MOUTH EVERY DAY 30 tablet 11   Multiple Vitamin (MULTIVITAMIN WITH MINERALS) TABS tablet Take 1 tablet by mouth daily.     omeprazole (PRILOSEC) 40 MG capsule Take 40 mg by mouth 2 (two) times daily.     oxybutynin (DITROPAN-XL) 10 MG 24 hr tablet oxybutynin chloride ER 10 mg tablet,extended release 24 hr 90 tablet 3   predniSONE (DELTASONE) 20 MG tablet Take 1 tablet (20 mg total) by mouth daily with breakfast. 5 tablet 0  No current facility-administered medications on file prior to visit.        ROS:  All others reviewed and negative.  Objective        PE:  BP 138/82 (BP Location: Left Arm, Patient Position: Sitting, Cuff Size: Normal)   Pulse 92   Temp 98.2 F (36.8 C) (Oral)   Ht 5\' 7"  (1.702 m)   Wt 271 lb (122.9 kg)   LMP 09/13/2013   SpO2 99%   BMI 42.44 kg/m                 Constitutional: Pt appears in NAD               HENT: Head: NCAT.                Right Ear: External ear normal.                 Left Ear: External ear normal.                Eyes: . Pupils are equal, round, and reactive to light. Conjunctivae and EOM are normal               Nose: without d/c or deformity               Neck: Neck supple. Gross normal ROM               Cardiovascular: Normal rate and regular rhythm.                 Pulmonary/Chest: Effort normal and breath sounds without rales or wheezing.                Abd:  Soft, NT, ND, + BS, no organomegaly               Neurological: Pt is alert. At baseline orientation, motor grossly intact               Skin: Skin is warm. No rashes, no other new lesions, LE edema - none               Psychiatric: Pt behavior is normal without agitation   Micro: none  Cardiac tracings I have personally interpreted today:  none  Pertinent Radiological findings (summarize): none   Lab Results  Component Value Date   WBC 5.8 12/17/2022   HGB  14.4 12/17/2022   HCT 42.7 12/17/2022   PLT 195.0 12/17/2022   GLUCOSE 164 (H) 09/15/2023   CHOL 144 09/15/2023   TRIG 114.0 09/15/2023   HDL 51.70 09/15/2023   LDLCALC 69 09/15/2023   ALT 40 (H) 09/15/2023   AST 26 09/15/2023   NA 142 09/15/2023   K 4.1 09/15/2023   CL 105 09/15/2023   CREATININE 0.69 09/15/2023   BUN 17 09/15/2023   CO2 26 09/15/2023   TSH 0.85 03/12/2022   INR 1.0 04/04/2021   HGBA1C 7.5 (H) 09/15/2023   MICROALBUR <0.7 03/12/2022   Assessment/Plan:  Tanya [name] is a 67 y.o. White or Caucasian [1] female with  has a past medical history of Allergic rhinitis, Blepharospasm (10/09/2010), Cancer (HCC), Diabetes mellitus without complication (HCC), Hip pain, History of melanoma excision, Hypertension, Macular pucker (04/2021), Mild intermittent asthma, OA (osteoarthritis), PMB (postmenopausal bleeding), PONV (postoperative nausea and vomiting), Precancerous changes of the cervix, Sleep apnea, and Varicose vein.  Vitamin D deficiency Last vitamin D Lab Results  Component Value Date   VD25OH 36.16 09/15/2023  Low, to start oral replacement   Obesity Chronic persistent despite attention to diet and wt control; ok for phentermine 30 every day/topamax 50 bid x 6 mo trial  Hypertensive disorder BP Readings from Last 3 Encounters:  09/22/23 138/82  07/13/23 136/80  04/10/23 128/78   Stable, pt to continue medical treatment norvasc 10 every day, avapro 300 qd   Diabetes mellitus (HCC) Lab Results  Component Value Date   HGBA1C 7.5 (H) 09/15/2023   Uncontrolled,, pt to continue current medical treatment glipizide xl 10 every day and f/u lab today, and work on obesity wt loss as above  Followup: Return in about 6 months (around 03/22/2024).  Oliver Barre, MD 09/22/2023 10:03 AM [facility]

## 2023-09-22 NOTE — Assessment & Plan Note (Addendum)
Lab Results  Component Value Date   HGBA1C 7.5 (H) 09/15/2023   Uncontrolled,, pt to continue current medical treatment glipizide xl 10 every day and f/u lab today, and work on obesity wt loss as above

## 2023-09-22 NOTE — Assessment & Plan Note (Signed)
Last vitamin D Lab Results  Component Value Date   VD25OH 36.16 09/15/2023   Low, to start oral replacement

## 2023-09-22 NOTE — Assessment & Plan Note (Signed)
BP Readings from Last 3 Encounters:  09/22/23 138/82  07/13/23 136/80  04/10/23 128/78   Stable, pt to continue medical treatment norvasc 10 every day, avapro 300 qd

## 2023-09-22 NOTE — Assessment & Plan Note (Signed)
Chronic persistent despite attention to diet and wt control; ok for phentermine 30 every day/topamax 50 bid x 6 mo trial

## 2023-10-21 DIAGNOSIS — G4733 Obstructive sleep apnea (adult) (pediatric): Secondary | ICD-10-CM | POA: Diagnosis not present

## 2023-11-20 DIAGNOSIS — G4733 Obstructive sleep apnea (adult) (pediatric): Secondary | ICD-10-CM | POA: Diagnosis not present

## 2023-11-28 ENCOUNTER — Other Ambulatory Visit: Payer: Self-pay | Admitting: Internal Medicine

## 2023-11-30 ENCOUNTER — Other Ambulatory Visit: Payer: Self-pay

## 2023-12-09 ENCOUNTER — Ambulatory Visit: Payer: Medicare HMO | Admitting: Adult Health

## 2023-12-15 DIAGNOSIS — G5131 Clonic hemifacial spasm, right: Secondary | ICD-10-CM | POA: Diagnosis not present

## 2023-12-16 ENCOUNTER — Encounter: Payer: Self-pay | Admitting: Adult Health

## 2023-12-16 ENCOUNTER — Ambulatory Visit: Payer: Medicare Other | Admitting: Adult Health

## 2023-12-16 VITALS — BP 124/68 | HR 104 | Temp 98.0°F | Ht 67.0 in | Wt 258.0 lb

## 2023-12-16 DIAGNOSIS — R918 Other nonspecific abnormal finding of lung field: Secondary | ICD-10-CM

## 2023-12-16 DIAGNOSIS — R9389 Abnormal findings on diagnostic imaging of other specified body structures: Secondary | ICD-10-CM

## 2023-12-16 DIAGNOSIS — G4733 Obstructive sleep apnea (adult) (pediatric): Secondary | ICD-10-CM | POA: Diagnosis not present

## 2023-12-16 DIAGNOSIS — J4520 Mild intermittent asthma, uncomplicated: Secondary | ICD-10-CM | POA: Diagnosis not present

## 2023-12-16 DIAGNOSIS — R053 Chronic cough: Secondary | ICD-10-CM

## 2023-12-16 DIAGNOSIS — K219 Gastro-esophageal reflux disease without esophagitis: Secondary | ICD-10-CM

## 2023-12-16 DIAGNOSIS — J309 Allergic rhinitis, unspecified: Secondary | ICD-10-CM

## 2023-12-16 NOTE — Assessment & Plan Note (Signed)
Felt secondary to chronic rhinitis and GERD continue on cough control regimen as needed.  Continue on aggressive trigger prevention.

## 2023-12-16 NOTE — Progress Notes (Signed)
@Patient  ID: Tanya [name], female    DOB: 11-08-1956, 68 y.o.   MRN: [id number]  Chief Complaint  Patient presents with   Follow-up    Referring provider: Corwin Levins, MD  HPI: 68 year old female former smoker followed for obstructive sleep apnea, mild intermittent asthma and chronic cough   TEST/EVENTS :  Spirometry 08/21/2022 shows no evidence of airway obstruction, ratio 80, FEV1 82% and FVC 78%    Chest x-ray August 21, 2022 clear lungs   CBC with differential April 2023, eosinophil absolute count 100  Cough work up  02/2023 High-resolution CT chest was negative for interstitial lung disease, scattered pulmonary nodules 7 mm LLL , BB atx  Allergen panel, normal IgE and absolute eosinophil count at 100.  03/2023 CT sinus was negative ENT referral -notes on May 08, 2023 reviewed with laryngoscopy showed mild post glottic edema consistent with LPR. PPI BID  Voice rest /stop singing. Speech center referral.   12/16/2023 Follow up : Asthma, Chronic cough and OSA  Patient returns for 76-month follow-up.  Patient has mild intermittent asthma and upper airway cough syndrome.  Has previously been tried on Advair and Symbicort without any perceived benefit.  She has been treated with aggressive cough control regimen with Zyrtec, Protonix, Pepcid, Delsym, Tessalon and Dymista.  High-resolution CT chest was negative for interstitial lung disease.  Lab work showed a negative allergen panel.  Normal IgE.  Absolute eosinophil count at 100.  CT sinus negative.  ENT referral.  ENT notes from May 08, 2023 with indirect laryngoscopy showed mild post glottic edema consistent with LPR.  She was recommended to increase her PPI to twice daily.  She was recommended on voice rest and stopping singing at church.  Referred to speech therapy. Says this rehab really helped. Stopped arnuity, no perceived benefit.  Patient says she is doing very well.  Has no significant cough.  Says she has been doing very  well since last visit.  She denies any chest pain orthopnea PND or hemoptysis.  She remains on Singulair and Xyzal daily.  Is now using Dymista, chlor tabs and Robitussin as needed.  Remains on Prilosec twice daily.  CT chest April 2024 showed pulmonary nodules up to 7 mm in the left lower lobe.  Recommendations for a CT chest in 1 year has been planned.  We discussed this will be due in April 2025.  Patient is followed for obstructive sleep apnea.  She remains on CPAP at bedtime.  Patient says she cannot sleep without her CPAP.  Feels that she benefits from CPAP with decreased daytime sleepiness.  CPAP download shows excellent compliance with 100% usage.  Daily average usage at 8 hours.  Patient is on auto CPAP 8 to 20 cm H2O.  Daily average pressure of 12.0 cm H2O.  AHI 0.7/hour. Using a nasal mask.   Just went on 2 week cruise -had so much fun.   Working on being more active and diet. Has lost 20lbs.   Going to [place] until April.   Allergies  Allergen Reactions   Ace Inhibitors Hives   Amoxicillin Hives   Other Hives    Nucafed   Penicillins Hives    Has patient had a PCN reaction causing immediate rash, facial/tongue/throat swelling, SOB or lightheadedness with hypotension: Yes Has patient had a PCN reaction causing severe rash involving mucus membranes or skin necrosis: No Has patient had a PCN reaction that required hospitalization No Has patient had a PCN reaction occurring  within the last 10 years: No If all of the above answers are "NO", then may proceed with Cephalosporin use.    Propylene Glycol Itching    Itching of eyes/lashes   Metformin And Related Other (See Comments)    GI upset   Wound Dressing Adhesive Other (See Comments)    Tore her skin   Wound Dressings Other (See Comments)    Tore her skin   Pseudoephedrine Hives   Pseudoephedrine-Codeine Hives    Immunization History  Administered Date(s) Administered   Fluad Trivalent(High Dose 65+) 09/22/2023    H1N1 10/30/2008   Influenza Nasal 08/16/2016   Influenza Split 08/24/2019   Influenza Whole 08/24/1998, 07/25/2009, 08/24/2010, 08/24/2012   Influenza,inj,Quad PF,6+ Mos 10/02/2018, 08/22/2019, 08/10/2020, 09/18/2022   Influenza,inj,Quad PF,6-35 Mos 09/06/2015   Influenza,inj,quad, With Preservative 08/20/2016, 09/12/2017   Influenza-Unspecified 09/03/2013, 08/22/2016, 09/23/2017, 10/02/2018   PFIZER Comirnaty(Gray Top)Covid-19 Tri-Sucrose Vaccine 02/12/2020, 03/04/2020   PNEUMOCOCCAL CONJUGATE-20 03/23/2023   Pneumococcal Conjugate-13 02/08/2016   Pneumococcal Polysaccharide-23 01/11/2018   Td 11/25/1995, 11/24/2005   Tdap 02/08/2016   Zoster Recombinant(Shingrix) 04/06/2018, 10/09/2018    Past Medical History:  Diagnosis Date   Allergic rhinitis    Blepharospasm 10/09/2010   Cancer (HCC)    Skin   Diabetes mellitus without complication (HCC)    Hip pain    History of melanoma excision    MULTIPLE SITES   Hypertension    Macular pucker 04/2021   Left   Mild intermittent asthma    OA (osteoarthritis)    PMB (postmenopausal bleeding)    PONV (postoperative nausea and vomiting)    did not experience after D&C, request to use same medicaitons from that procedure   Precancerous changes of the cervix    Sleep apnea    tested in home around 2 yrs ago   Varicose vein    wears compression stocking    Tobacco History: Social History   Tobacco Use  Smoking Status Former   Current packs/day: 0.00   Average packs/day: 2.0 packs/day for 10.0 years (20.0 ttl pk-yrs)   Types: Cigarettes   Start date: 11/24/1977   Quit date: 11/25/1987   Years since quitting: 36.0  Smokeless Tobacco Never   Counseling given: Not Answered   Outpatient Medications Prior to Visit  Medication Sig Dispense Refill   albuterol (VENTOLIN HFA) 108 (90 Base) MCG/ACT inhaler Inhale 2 puffs into the lungs every 6 (six) hours as needed for wheezing or shortness of breath. 18 g 2   amLODipine (NORVASC) 10  MG tablet TAKE 1 TABLET BY MOUTH EVERY DAY 90 tablet 3   aspirin 81 MG tablet Take 1 tablet (81 mg total) by mouth daily. 60 tablet 1   calcium acetate (PHOSLO) 667 MG capsule TAKE 1 CAPSULE (667 MG TOTAL) BY MOUTH ONCE DAILY 90 capsule 3   celecoxib (CELEBREX) 200 MG capsule TAKE 1 CAPSULE BY MOUTH TWICE A DAY AS NEEDED 60 capsule 1   glipiZIDE (GLUCOTROL XL) 10 MG 24 hr tablet Take 1 tablet (10 mg total) by mouth daily with breakfast. 90 tablet 3   irbesartan (AVAPRO) 300 MG tablet Take 1 tablet (300 mg total) by mouth daily. 90 tablet 3   methocarbamol (ROBAXIN) 500 MG tablet Take 1 tablet (500 mg total) by mouth every 6 (six) hours as needed for muscle spasms. 60 tablet 1   montelukast (SINGULAIR) 10 MG tablet TAKE 1 TABLET BY MOUTH EVERY DAY 30 tablet 11   Multiple Vitamin (MULTIVITAMIN WITH MINERALS) TABS tablet Take  1 tablet by mouth daily.     omeprazole (PRILOSEC) 40 MG capsule Take 40 mg by mouth 2 (two) times daily.     oxybutynin (DITROPAN-XL) 10 MG 24 hr tablet oxybutynin chloride ER 10 mg tablet,extended release 24 hr 90 tablet 3   phentermine 30 MG capsule Take 1 capsule (30 mg total) by mouth every morning. 90 capsule 1   topiramate (TOPAMAX) 50 MG tablet Take 1 tablet (50 mg total) by mouth 2 (two) times daily. 180 tablet 1   ARNUITY ELLIPTA 200 MCG/ACT AEPB INHALE 200 MCG INTO THE LUNGS DAILY. (Patient not taking: Reported on 12/16/2023) 90 each 1   Azelastine-Fluticasone 137-50 MCG/ACT SUSP Place 1 puff into the nose 2 (two) times daily. (Patient not taking: Reported on 12/16/2023) 1 g 5   fluticasone-salmeterol (WIXELA INHUB) 250-50 MCG/ACT AEPB Inhale 1 puff into the lungs 2 (two) times daily. (Patient not taking: Reported on 12/16/2023)     predniSONE (DELTASONE) 20 MG tablet Take 1 tablet (20 mg total) by mouth daily with breakfast. (Patient not taking: Reported on 12/16/2023) 5 tablet 0   No facility-administered medications prior to visit.     Review of Systems:    Constitutional:   No  weight loss, night sweats,  Fevers, chills, fatigue, or  lassitude.  HEENT:   No headaches,  Difficulty swallowing,  Tooth/dental problems, or  Sore throat,                No sneezing, itching, ear ache, +nasal congestion, post nasal drip,   CV:  No chest pain,  Orthopnea, PND, swelling in lower extremities, anasarca, dizziness, palpitations, syncope.   GI  No heartburn, indigestion, abdominal pain, nausea, vomiting, diarrhea, change in bowel habits, loss of appetite, bloody stools.   Resp: No shortness of breath with exertion or at rest.  No excess mucus, no productive cough,  No non-productive cough,  No coughing up of blood.  No change in color of mucus.  No wheezing.  No chest wall deformity  Skin: no rash or lesions.  GU: no dysuria, change in color of urine, no urgency or frequency.  No flank pain, no hematuria   MS:  No joint pain or swelling.  No decreased range of motion.  No back pain.    Physical Exam  BP 124/68 (BP Location: Left Arm, Cuff Size: Large)   Pulse (!) 104   Temp 98 F (36.7 C) (Temporal)   Ht 5\' 7"  (1.702 m)   Wt 258 lb (117 kg)   LMP 09/13/2013   SpO2 100%   BMI 40.41 kg/m   GEN: A/Ox3; pleasant , NAD, well nourished    HEENT:  [place]/AT, NOSE-clear, THROAT-clear, no lesions, no postnasal drip or exudate noted.   NECK:  Supple w/ fair ROM; no JVD; normal carotid impulses w/o bruits; no thyromegaly or nodules palpated; no lymphadenopathy.    RESP  Clear  P & A; w/o, wheezes/ rales/ or rhonchi. no accessory muscle use, no dullness to percussion  CARD:  RRR, no m/r/g, tr peripheral edema, pulses intact, no cyanosis or clubbing.  GI:   Soft & nt; nml bowel sounds; no organomegaly or masses detected.   Musco: Warm bil, no deformities or joint swelling noted.   Neuro: alert, no focal deficits noted.    Skin: Warm, no lesions or rashes    Lab Results:     BNP No results found for: "BNP"  ProBNP No results found for:  "PROBNP"  Imaging: No results  found.  Administration History     None           No data to display          No results found for: "NITRICOXIDE"      Assessment & Plan:   Asthma Mild intermittent asthma versus cough variant asthma triggered by postnasal drainage and reflux.  I have advised her to continue on trigger prevention.  Had no perceived benefit with Symbicort or Advair.  If symptoms flare may need to consider restarting Arnuity as she previously did well on this.  Continue on aggressive maintenance regimen  Plan  Patient Instructions  Continue on CPAP at bedtime Work on healthy weight Do not drive if sleepy  Dymista 1 puff Twice daily  As needed   Robitussin DM 2 tsp every 4hr for cough As needed   Chlor tabs 4mg  (Chlorpheniramine ) 2 At bedtime  As needed   Continue on Prilosec twice daily Albuterol inhaler As needed   Xyzal daily.  Continue on Singulair daily. Sips of water to soothe throat, avoid throat clearing and cough.  NO MINTS   CT chest in April 2025 .   Follow-up in 4 months with Dr. Vassie Loll  or Brentton Wardlow NP and As needed   Please contact office for sooner follow up if symptoms do not improve or worsen or seek emergency care     Allergic rhinitis Continue on aggressive regimen with Singulair and Xyzal.  May use Dymista as needed.  Laryngopharyngeal reflux (LPR) Upper airway cough syndrome felt secondary to LPR/GERD.  Continue on Prilosec twice daily.  GERD diet.  Encouraged on weight loss  Chronic cough Felt secondary to chronic rhinitis and GERD continue on cough control regimen as needed.  Continue on aggressive trigger prevention.  Abnormal CT scan Tiny pulmonary nodules noted on high-resolution CT chest April 2024.  Plan CT chest April 2025.     Rubye Oaks, NP 12/16/2023

## 2023-12-16 NOTE — Patient Instructions (Addendum)
Continue on CPAP at bedtime Work on healthy weight Do not drive if sleepy  Dymista 1 puff Twice daily  As needed   Robitussin DM 2 tsp every 4hr for cough As needed   Chlor tabs 4mg  (Chlorpheniramine ) 2 At bedtime  As needed   Continue on Prilosec twice daily Albuterol inhaler As needed   Xyzal daily.  Continue on Singulair daily. Sips of water to soothe throat, avoid throat clearing and cough.  NO MINTS   CT chest in April 2025 .   Follow-up in 4 months with Dr. Vassie Loll  or Heber Hoog NP and As needed   Please contact office for sooner follow up if symptoms do not improve or worsen or seek emergency care

## 2023-12-16 NOTE — Assessment & Plan Note (Signed)
Tiny pulmonary nodules noted on high-resolution CT chest April 2024.  Plan CT chest April 2025.

## 2023-12-16 NOTE — Assessment & Plan Note (Signed)
Continue on aggressive regimen with Singulair and Xyzal.  May use Dymista as needed.

## 2023-12-16 NOTE — Assessment & Plan Note (Signed)
Upper airway cough syndrome felt secondary to LPR/GERD.  Continue on Prilosec twice daily.  GERD diet.  Encouraged on weight loss

## 2023-12-16 NOTE — Assessment & Plan Note (Signed)
Mild intermittent asthma versus cough variant asthma triggered by postnasal drainage and reflux.  I have advised her to continue on trigger prevention.  Had no perceived benefit with Symbicort or Advair.  If symptoms flare may need to consider restarting Arnuity as she previously did well on this.  Continue on aggressive maintenance regimen  Plan  Patient Instructions  Continue on CPAP at bedtime Work on healthy weight Do not drive if sleepy  Dymista 1 puff Twice daily  As needed   Robitussin DM 2 tsp every 4hr for cough As needed   Chlor tabs 4mg  (Chlorpheniramine ) 2 At bedtime  As needed   Continue on Prilosec twice daily Albuterol inhaler As needed   Xyzal daily.  Continue on Singulair daily. Sips of water to soothe throat, avoid throat clearing and cough.  NO MINTS   CT chest in April 2025 .   Follow-up in 4 months with Dr. Vassie Loll  or Carlyle Achenbach NP and As needed   Please contact office for sooner follow up if symptoms do not improve or worsen or seek emergency care

## 2023-12-17 DIAGNOSIS — Z85820 Personal history of malignant melanoma of skin: Secondary | ICD-10-CM | POA: Diagnosis not present

## 2023-12-17 DIAGNOSIS — Z129 Encounter for screening for malignant neoplasm, site unspecified: Secondary | ICD-10-CM | POA: Diagnosis not present

## 2023-12-17 DIAGNOSIS — L818 Other specified disorders of pigmentation: Secondary | ICD-10-CM | POA: Diagnosis not present

## 2023-12-21 DIAGNOSIS — G4733 Obstructive sleep apnea (adult) (pediatric): Secondary | ICD-10-CM | POA: Diagnosis not present

## 2024-01-07 DIAGNOSIS — G4733 Obstructive sleep apnea (adult) (pediatric): Secondary | ICD-10-CM | POA: Diagnosis not present

## 2024-01-08 ENCOUNTER — Other Ambulatory Visit: Payer: Self-pay | Admitting: Internal Medicine

## 2024-01-13 DIAGNOSIS — H2512 Age-related nuclear cataract, left eye: Secondary | ICD-10-CM | POA: Diagnosis not present

## 2024-01-21 DIAGNOSIS — G4733 Obstructive sleep apnea (adult) (pediatric): Secondary | ICD-10-CM | POA: Diagnosis not present

## 2024-01-25 DIAGNOSIS — H2513 Age-related nuclear cataract, bilateral: Secondary | ICD-10-CM | POA: Diagnosis not present

## 2024-02-02 DIAGNOSIS — H2512 Age-related nuclear cataract, left eye: Secondary | ICD-10-CM | POA: Diagnosis not present

## 2024-02-02 HISTORY — PX: CATARACT EXTRACTION: SUR2

## 2024-02-07 ENCOUNTER — Other Ambulatory Visit: Payer: Self-pay | Admitting: Internal Medicine

## 2024-02-08 ENCOUNTER — Other Ambulatory Visit: Payer: Self-pay

## 2024-02-15 ENCOUNTER — Ambulatory Visit (INDEPENDENT_AMBULATORY_CARE_PROVIDER_SITE_OTHER): Payer: Medicare Other

## 2024-02-15 VITALS — Ht 67.0 in | Wt 250.0 lb

## 2024-02-15 DIAGNOSIS — E1165 Type 2 diabetes mellitus with hyperglycemia: Secondary | ICD-10-CM

## 2024-02-15 DIAGNOSIS — Z0000 Encounter for general adult medical examination without abnormal findings: Secondary | ICD-10-CM | POA: Diagnosis not present

## 2024-02-15 NOTE — Progress Notes (Signed)
 Subjective:   Tanya [name] is a 68 y.o. who presents for a Medicare Wellness preventive visit.  Visit Complete: Virtual I connected with  Tanya [name] on 02/15/24 by a video and audio enabled telemedicine application and verified that I am speaking with the correct person using two identifiers.  Patient Location: Home  Provider Location: Home Office  I discussed the limitations of evaluation and management by telemedicine. The patient expressed understanding and agreed to proceed.  Vital Signs: Because this visit was a virtual/telehealth visit, some criteria may be missing or patient reported. Any vitals not documented were not able to be obtained and vitals that have been documented are patient reported.   Persons Participating in Visit: Patient.  AWV Questionnaire: No: Patient Medicare AWV questionnaire was not completed prior to this visit.  Cardiac Risk Factors include: advanced age (>79men, >73 women);Other (see comment);diabetes mellitus, Risk factor comments: Asthma, OSA     Objective:    Today's Vitals   02/15/24 1306  Weight: 250 lb (113.4 kg)  Height: 5\' 7"  (1.702 m)   Body mass index is 39.16 kg/m.     02/15/2024    1:22 PM 02/15/2024    1:21 PM 05/15/2021    4:42 PM 04/04/2021    2:20 PM 10/02/2016    3:19 PM 09/01/2016    8:14 AM 06/02/2016    1:35 PM  Advanced Directives  Does Patient Have a Medical Advance Directive? Yes No No Yes Yes Yes Yes  Type of Estate agent of [place];Living will   Living will;Healthcare Power of [employer] Power of [place];Living will Healthcare Power of [employer] of [place];Living will  Does patient want to make changes to medical advance directive?     No - Patient declined  No - Patient declined  Copy of Healthcare Power of Attorney in Chart? No - copy requested    No - copy requested  No - copy requested  Would patient like information on creating a medical advance directive?  No - Patient declined  No - Patient declined        Current Medications (verified) Outpatient Encounter Medications as of 02/15/2024  Medication Sig   albuterol (VENTOLIN HFA) 108 (90 Base) MCG/ACT inhaler Inhale 2 puffs into the lungs every 6 (six) hours as needed for wheezing or shortness of breath.   amLODipine (NORVASC) 10 MG tablet TAKE 1 TABLET BY MOUTH EVERY DAY   aspirin 81 MG tablet Take 1 tablet (81 mg total) by mouth daily.   calcium acetate (PHOSLO) 667 MG capsule TAKE 1 CAPSULE (667 MG TOTAL) BY MOUTH ONCE DAILY   celecoxib (CELEBREX) 200 MG capsule TAKE 1 CAPSULE BY MOUTH TWICE A DAY AS NEEDED   glipiZIDE (GLUCOTROL XL) 10 MG 24 hr tablet Take 1 tablet (10 mg total) by mouth daily with breakfast.   irbesartan (AVAPRO) 300 MG tablet Take 1 tablet (300 mg total) by mouth daily.   methocarbamol (ROBAXIN) 500 MG tablet Take 1 tablet (500 mg total) by mouth every 6 (six) hours as needed for muscle spasms.   montelukast (SINGULAIR) 10 MG tablet TAKE 1 TABLET BY MOUTH EVERY DAY   Multiple Vitamin (MULTIVITAMIN WITH MINERALS) TABS tablet Take 1 tablet by mouth daily.   omeprazole (PRILOSEC) 40 MG capsule Take 40 mg by mouth 2 (two) times daily.   oxybutynin (DITROPAN-XL) 10 MG 24 hr tablet oxybutynin chloride ER 10 mg tablet,extended release 24 hr   phentermine 30 MG capsule Take 1  capsule (30 mg total) by mouth every morning.   topiramate (TOPAMAX) 50 MG tablet Take 1 tablet (50 mg total) by mouth 2 (two) times daily.   ARNUITY ELLIPTA 200 MCG/ACT AEPB INHALE 200 MCG INTO THE LUNGS DAILY. (Patient not taking: Reported on 02/15/2024)   Azelastine-Fluticasone 137-50 MCG/ACT SUSP Place 1 puff into the nose 2 (two) times daily. (Patient not taking: Reported on 02/15/2024)   fluticasone-salmeterol (WIXELA INHUB) 250-50 MCG/ACT AEPB Inhale 1 puff into the lungs 2 (two) times daily. (Patient not taking: Reported on 02/15/2024)   predniSONE (DELTASONE) 20 MG tablet Take 1 tablet (20 mg total) by  mouth daily with breakfast. (Patient not taking: Reported on 02/15/2024)   No facility-administered encounter medications on file as of 02/15/2024.    Allergies (verified) Ace inhibitors, Amoxicillin, Other, Penicillins, Propylene glycol, Metformin and related, Wound dressing adhesive, Wound dressings, Pseudoephedrine, and Pseudoephedrine-codeine   History: Past Medical History:  Diagnosis Date   Allergic rhinitis    Blepharospasm 10/09/2010   Cancer (HCC)    Skin   Diabetes mellitus without complication (HCC)    Hip pain    History of melanoma excision    MULTIPLE SITES   Hypertension    Macular pucker 04/2021   Left   Mild intermittent asthma    OA (osteoarthritis)    PMB (postmenopausal bleeding)    PONV (postoperative nausea and vomiting)    did not experience after D&C, request to use same medicaitons from that procedure   Precancerous changes of the cervix    Sleep apnea    tested in home around 2 yrs ago   Varicose vein    wears compression stocking   Past Surgical History:  Procedure Laterality Date   ABDOMINAL HYSTERECTOMY     CATARACT EXTRACTION Left 02/02/2024   COLONOSCOPY     DILATION AND CURETTAGE OF UTERUS     ESOPHAGOGASTRODUODENOSCOPY  10/14/2004   HAMMER TOE SURGERY     HYSTEROSCOPY WITH D & C N/A 04/07/2016   Procedure: DILATATION AND CURETTAGE /HYSTEROSCOPY WITH MYOSURE POLYPECTOMY;  Surgeon: Lavina Hamman, MD;  [facility];  Service: Gynecology;  Laterality: N/A;   JOINT REPLACEMENT     KNEE ARTHROSCOPY Bilateral left 08-01-2005/  right   LAPAROSCOPIC HYSTERECTOMY N/A 06/02/2016   Procedure: HYSTERECTOMY TOTAL LAPAROSCOPIC;  Surgeon: Lavina Hamman, MD;  Location: [facility];  Service: Gynecology;  Laterality: N/A;   LAPAROSCOPIC SALPINGO OOPHERECTOMY Bilateral 06/02/2016   Procedure: LAPAROSCOPIC SALPINGO OOPHORECTOMY;  Surgeon: Lavina Hamman, MD;  Location: [facility];  Service: Gynecology;  Laterality: Bilateral;   RE-DO  ROTATOR CUFF REPAIR/  SAD/  ACROMIOPLASTY Right 01/04/2009   SHOULDER ARTHROSCOPY W/ ROTATOR CUFF REPAIR Right    tailor bunion removal     TONSILLECTOMY     TOTAL HIP ARTHROPLASTY Right 09/12/2016   Procedure: RIGHT TOTAL HIP ARTHROPLASTY ANTERIOR APPROACH;  Surgeon: Samson Frederic, MD;  Location: [facility];  Service: Orthopedics;  Laterality: Right;   TOTAL HIP ARTHROPLASTY Right 10/01/2016   Procedure: IRRIGATION AND DEBRIDEMENT RIGHT HIP;  Surgeon: Samson Frederic, MD;  Location: [facility];  Service: Orthopedics;  Laterality: Right;   TOTAL KNEE ARTHROPLASTY Left 12/29/2013   Procedure: LEFT TOTAL KNEE ARTHROPLASTY;  Surgeon: Javier Docker, MD;  Location: [facility];  Service: Orthopedics;  Laterality: Left;   TOTAL KNEE ARTHROPLASTY Right 05/15/2021   Procedure: TOTAL KNEE ARTHROPLASTY;  Surgeon: Jene Every, MD;  Location: [facility];  Service: Orthopedics;  Laterality: Right;  2.5 hrs   Family History  Problem Relation Age of Onset   Esophageal cancer Father    Melanoma Sister    Diabetes Other    Ovarian cancer Other    Asthma Other        siblings   Social History   Socioeconomic History   Marital status: Married    Spouse name: Fayrene Fearing   Number of children: Not on file   Years of education: Not on file   Highest education level: Not on file  Occupational History   Occupation: RETIRED/administrative assistant  Tobacco Use   Smoking status: Former    Current packs/day: 0.00    Average packs/day: 2.0 packs/day for 10.0 years (20.0 ttl pk-yrs)    Types: Cigarettes    Start date: 11/24/1977    Quit date: 11/25/1987    Years since quitting: 36.2   Smokeless tobacco: Never  Vaping Use   Vaping status: Never Used  Substance and Sexual Activity   Alcohol use: Yes    Alcohol/week: 2.0 standard drinks of alcohol    Types: 2 Glasses of wine per week    Comment: occ   Drug use: No   Sexual activity: Yes  Other Topics Concern   Not on file  Social History Narrative   1 dog and 1 cat       Lives at home with husband.   Social Drivers of Corporate investment banker Strain: Low Risk  (02/15/2024)   Overall Financial Resource Strain (CARDIA)    Difficulty of Paying Living Expenses: Not hard at all  Food Insecurity: No Food Insecurity (02/15/2024)   Hunger Vital Sign    Worried About Running Out of Food in the Last Year: Never true    Ran Out of Food in the Last Year: Never true  Transportation Needs: No Transportation Needs (02/15/2024)   PRAPARE - Administrator, Civil Service (Medical): No    Lack of Transportation (Non-Medical): No  Physical Activity: Sufficiently Active (02/15/2024)   Exercise Vital Sign    Days of Exercise per Week: 4 days    Minutes of Exercise per Session: 60 min  Stress: No Stress Concern Present (02/15/2024)   [employer] of Occupational Health - Occupational Stress Questionnaire    Feeling of Stress : Not at all  Social Connections: Moderately Integrated (02/15/2024)   Social Connection and Isolation Panel [NHANES]    Frequency of Communication with Friends and Family: Three times a week    Frequency of Social Gatherings with Friends and Family: Once a week    Attends Religious Services: More than 4 times per year    Active Member of [employer] or Organizations: No    Attends Engineer, structural: Never    Marital Status: Married    Tobacco Counseling Counseling given: Not Answered    Clinical Intake:  Pre-visit preparation completed: Yes  Pain : No/denies pain     BMI - recorded: 39.16 Nutritional Status: BMI > 30  Obese Nutritional Risks: None Diabetes: Yes CBG done?: No Did pt. bring in CBG monitor from home?: No  Lab Results  Component Value Date   HGBA1C 7.5 (H) 09/15/2023   HGBA1C 7.6 (H) 03/12/2023   HGBA1C 7.3 (A) 09/18/2022     How often do you need to have someone help you when you read instructions, pamphlets, or other written materials from your doctor or pharmacy?: 1 -  Never  Interpreter Needed?: No  Information entered by :: Alexandro Line, RMA   Activities of Daily  Living     02/15/2024    1:12 PM  In your present state of health, do you have any difficulty performing the following activities:  Hearing? 1  Comment Some hearing loss-per pt  Vision? 0  Difficulty concentrating or making decisions? 0  Walking or climbing stairs? 0  Dressing or bathing? 0  Doing errands, shopping? 0  Preparing Food and eating ? N  Using the Toilet? N  In the past six months, have you accidently leaked urine? Y  Do you have problems with loss of bowel control? N  Managing your Medications? N  Managing your Finances? N  Housekeeping or managing your Housekeeping? N    Patient Care Team: Corwin Levins, MD as PCP - General  Indicate any recent Medical Services you may have received from other than [facility] providers in the past year (date may be approximate).     Assessment:   This is a routine wellness examination for Tanya [name].  Hearing/Vision screen Hearing Screening - Comments:: Some hearing loss-per pt Vision Screening - Comments:: Wears eyeglasses   Goals Addressed   None    Depression Screen     02/15/2024    1:26 PM 09/22/2023    9:12 AM 03/23/2023    1:19 PM 09/18/2022    8:32 AM 03/19/2022    9:47 AM 02/07/2021    3:58 PM 02/07/2021    3:28 PM  PHQ 2/9 Scores  PHQ - 2 Score 3 0 0 0 0 0 0  PHQ- 9 Score 3   0 0      Fall Risk     02/15/2024    1:22 PM 12/16/2023    9:18 AM 09/22/2023    9:12 AM 03/23/2023    1:19 PM 09/18/2022    8:32 AM  Fall Risk   Falls in the past year? 1 1 0 0 0  Number falls in past yr: 1 0 0 0   Injury with Fall? 1  0 0   Risk for fall due to :   No Fall Risks No Fall Risks No Fall Risks  Follow up Falls evaluation completed;Falls prevention discussed  Falls evaluation completed Falls evaluation completed Falls evaluation completed    MEDICARE RISK AT HOME:  Medicare Risk at Home Any stairs in or around the  home?: No If so, are there any without handrails?: No Home free of loose throw rugs in walkways, pet beds, electrical cords, etc?: Yes Adequate lighting in your home to reduce risk of falls?: Yes Life alert?: No Use of a cane, walker or w/c?: No Grab bars in the bathroom?: No Shower chair or bench in shower?: No Elevated toilet seat or a handicapped toilet?: No  TIMED UP AND GO:  Was the test performed?  No  Cognitive Function: Normal: Normal cognitive status assessed by direct observation by this Clinical Health Advisor. No abnormalities found. Patient is able to answer questions in an accurate and timely manner.        Immunizations Immunization History  Administered Date(s) Administered   Fluad Trivalent(High Dose 65+) 09/22/2023   H1N1 10/30/2008   Influenza Nasal 08/16/2016   Influenza Split 08/24/2019   Influenza Whole 08/24/1998, 07/25/2009, 08/24/2010, 08/24/2012   Influenza,inj,Quad PF,6+ Mos 10/02/2018, 08/22/2019, 08/10/2020, 09/18/2022   Influenza,inj,Quad PF,6-35 Mos 09/06/2015   Influenza,inj,quad, With Preservative 08/20/2016, 09/12/2017   Influenza-Unspecified 09/03/2013, 08/22/2016, 09/23/2017, 10/02/2018   PFIZER Comirnaty(Gray Top)Covid-19 Tri-Sucrose Vaccine 02/12/2020, 03/04/2020   PNEUMOCOCCAL CONJUGATE-20 03/23/2023   Pneumococcal Conjugate-13 02/08/2016  Pneumococcal Polysaccharide-23 01/11/2018   Td 11/25/1995, 11/24/2005   Tdap 02/08/2016   Zoster Recombinant(Shingrix) 04/06/2018, 10/09/2018    Screening Tests Health Maintenance  Topic Date Due   Medicare Annual Wellness (AWV)  Never done   Diabetic kidney evaluation - Urine ACR  03/13/2023   OPHTHALMOLOGY EXAM  03/01/2024   HEMOGLOBIN A1C  03/15/2024   Diabetic kidney evaluation - eGFR measurement  09/14/2024   FOOT EXAM  09/21/2024   Fecal DNA (Cologuard)  10/21/2024   MAMMOGRAM  03/15/2025   DTaP/Tdap/Td (4 - Td or Tdap) 02/07/2026   Pneumonia Vaccine 87+ Years old  Completed    INFLUENZA VACCINE  Completed   DEXA SCAN  Completed   Hepatitis C Screening  Completed   Zoster Vaccines- Shingrix  Completed   HPV VACCINES  Aged Out   COVID-19 Vaccine  Discontinued    Health Maintenance  Health Maintenance Due  Topic Date Due   Medicare Annual Wellness (AWV)  Never done   Diabetic kidney evaluation - Urine ACR  03/13/2023   Health Maintenance Items Addressed: UACR (Urine Albumin:Creatinine Ratio)  Additional Screening:  Vision Screening: Recommended annual ophthalmology exams for early detection of glaucoma and other disorders of the eye.  Dental Screening: Recommended annual dental exams for proper oral hygiene  Community Resource Referral / Chronic Care Management: CRR required this visit?  No   CCM required this visit?  No     Plan:     I have personally reviewed and noted the following in the patient's chart:   Medical and social history Use of alcohol, tobacco or illicit drugs  Current medications and supplements including opioid prescriptions. Patient is not currently taking opioid prescriptions. Functional ability and status Nutritional status Physical activity Advanced directives List of other physicians Hospitalizations, surgeries, and ER visits in previous 12 months Vitals Screenings to include cognitive, depression, and falls Referrals and appointments  In addition, I have reviewed and discussed with patient certain preventive protocols, quality metrics, and best practice recommendations. A written personalized care plan for preventive services as well as general preventive health recommendations were provided to patient.     Tanya [name] L Alim Cattell, CMA   02/15/2024   After Visit Summary: (MyChart) Due to this being a telephonic visit, the after visit summary with patients personalized plan was offered to patient via MyChart   Notes: Please refer to Routing Comments.

## 2024-02-15 NOTE — Patient Instructions (Signed)
 Tanya [name] , Thank you for taking time to come for your Medicare Wellness Visit. I appreciate your ongoing commitment to your health goals. Please review the following plan we discussed and let me know if I can assist you in the future.   Referrals/Orders/Follow-Ups/Clinician Recommendations: It was nice talking with you today.  You will be due for a mammogram and an  eye exam by April of this year.  You will also be due for a Cologuard by November 2025.  Each day, aim for 6 glasses of water, plenty of protein in your diet and try to get up and walk/ stretch every hour for 5-10 minutes at a time.    This is a list of the screening recommended for you and due dates:  Health Maintenance  Topic Date Due   Medicare Annual Wellness Visit  Never done   Yearly kidney health urinalysis for diabetes  03/13/2023   Eye exam for diabetics  03/01/2024   Hemoglobin A1C  03/15/2024   Yearly kidney function blood test for diabetes  09/14/2024   Complete foot exam   09/21/2024   Cologuard (Stool DNA test)  10/21/2024   Mammogram  03/15/2025   DTaP/Tdap/Td vaccine (4 - Td or Tdap) 02/07/2026   Pneumonia Vaccine  Completed   Flu Shot  Completed   DEXA scan (bone density measurement)  Completed   Hepatitis C Screening  Completed   Zoster (Shingles) Vaccine  Completed   HPV Vaccine  Aged Out   COVID-19 Vaccine  Discontinued    Advanced directives: (Copy Requested) Please bring a copy of your health care power of attorney and living will to the office to be added to your chart at your convenience. You can mail to [facility] [address] [place], [place] [address] or email to [email or web address]  Next Medicare Annual Wellness Visit scheduled for next year: Yes

## 2024-02-18 DIAGNOSIS — G4733 Obstructive sleep apnea (adult) (pediatric): Secondary | ICD-10-CM | POA: Diagnosis not present

## 2024-02-26 ENCOUNTER — Other Ambulatory Visit: Payer: Self-pay | Admitting: Internal Medicine

## 2024-02-26 NOTE — Telephone Encounter (Signed)
 Copied from CRM [id number]. Topic: Clinical - Prescription Issue >> Feb 26, 2024  5:09 PM Denese Killings wrote: Reason for CRM: Patient states that prescription was sent to the wrong Pharmacy. phentermine 30 MG capsule [facility] [address]. [facility] stated [facility] because of the type of drug it is. Patient is completely out of medication.

## 2024-02-29 MED ORDER — PHENTERMINE HCL 30 MG PO CAPS: 30.0000 mg | ORAL_CAPSULE | ORAL | 1 refills | Status: DC

## 2024-02-29 NOTE — Telephone Encounter (Signed)
 Done erx

## 2024-03-07 ENCOUNTER — Other Ambulatory Visit: Payer: Self-pay

## 2024-03-07 ENCOUNTER — Other Ambulatory Visit: Payer: Self-pay | Admitting: Internal Medicine

## 2024-03-08 ENCOUNTER — Other Ambulatory Visit: Payer: Self-pay | Admitting: Internal Medicine

## 2024-03-08 DIAGNOSIS — G5131 Clonic hemifacial spasm, right: Secondary | ICD-10-CM | POA: Diagnosis not present

## 2024-03-09 ENCOUNTER — Other Ambulatory Visit: Payer: Self-pay

## 2024-03-13 ENCOUNTER — Other Ambulatory Visit: Payer: Self-pay | Admitting: Internal Medicine

## 2024-03-14 ENCOUNTER — Other Ambulatory Visit: Payer: Self-pay

## 2024-03-14 ENCOUNTER — Ambulatory Visit (INDEPENDENT_AMBULATORY_CARE_PROVIDER_SITE_OTHER)

## 2024-03-14 DIAGNOSIS — E041 Nontoxic single thyroid nodule: Secondary | ICD-10-CM | POA: Diagnosis not present

## 2024-03-14 DIAGNOSIS — R9389 Abnormal findings on diagnostic imaging of other specified body structures: Secondary | ICD-10-CM

## 2024-03-14 DIAGNOSIS — R918 Other nonspecific abnormal finding of lung field: Secondary | ICD-10-CM | POA: Diagnosis not present

## 2024-03-20 DIAGNOSIS — G4733 Obstructive sleep apnea (adult) (pediatric): Secondary | ICD-10-CM | POA: Diagnosis not present

## 2024-03-22 ENCOUNTER — Ambulatory Visit (INDEPENDENT_AMBULATORY_CARE_PROVIDER_SITE_OTHER): Payer: Medicare HMO | Admitting: Internal Medicine

## 2024-03-22 ENCOUNTER — Encounter: Payer: Self-pay | Admitting: Internal Medicine

## 2024-03-22 VITALS — BP 122/80 | HR 107 | Temp 98.1°F | Ht 67.0 in | Wt 246.0 lb

## 2024-03-22 DIAGNOSIS — I10 Essential (primary) hypertension: Secondary | ICD-10-CM | POA: Diagnosis not present

## 2024-03-22 DIAGNOSIS — Z7984 Long term (current) use of oral hypoglycemic drugs: Secondary | ICD-10-CM | POA: Diagnosis not present

## 2024-03-22 DIAGNOSIS — Z0000 Encounter for general adult medical examination without abnormal findings: Secondary | ICD-10-CM | POA: Diagnosis not present

## 2024-03-22 DIAGNOSIS — E538 Deficiency of other specified B group vitamins: Secondary | ICD-10-CM

## 2024-03-22 DIAGNOSIS — J309 Allergic rhinitis, unspecified: Secondary | ICD-10-CM

## 2024-03-22 DIAGNOSIS — E1165 Type 2 diabetes mellitus with hyperglycemia: Secondary | ICD-10-CM

## 2024-03-22 DIAGNOSIS — E559 Vitamin D deficiency, unspecified: Secondary | ICD-10-CM | POA: Diagnosis not present

## 2024-03-22 DIAGNOSIS — Z0001 Encounter for general adult medical examination with abnormal findings: Secondary | ICD-10-CM

## 2024-03-22 LAB — URINALYSIS, ROUTINE W REFLEX MICROSCOPIC
Bilirubin Urine: NEGATIVE
Hgb urine dipstick: NEGATIVE
Ketones, ur: NEGATIVE
Leukocytes,Ua: NEGATIVE
Nitrite: NEGATIVE
RBC / HPF: NONE SEEN (ref 0–?)
Specific Gravity, Urine: 1.01 (ref 1.000–1.030)
Total Protein, Urine: NEGATIVE
Urine Glucose: NEGATIVE
Urobilinogen, UA: 0.2 (ref 0.0–1.0)
pH: 7 (ref 5.0–8.0)

## 2024-03-22 LAB — VITAMIN D 25 HYDROXY (VIT D DEFICIENCY, FRACTURES): VITD: 42.37 ng/mL (ref 30.00–100.00)

## 2024-03-22 LAB — HEPATIC FUNCTION PANEL
ALT: 33 U/L (ref 0–35)
AST: 22 U/L (ref 0–37)
Albumin: 4.3 g/dL (ref 3.5–5.2)
Alkaline Phosphatase: 77 U/L (ref 39–117)
Bilirubin, Direct: 0.1 mg/dL (ref 0.0–0.3)
Total Bilirubin: 0.5 mg/dL (ref 0.2–1.2)
Total Protein: 6.9 g/dL (ref 6.0–8.3)

## 2024-03-22 LAB — VITAMIN B12: Vitamin B-12: 350 pg/mL (ref 211–911)

## 2024-03-22 LAB — BASIC METABOLIC PANEL WITH GFR
BUN: 12 mg/dL (ref 6–23)
CO2: 26 meq/L (ref 19–32)
Calcium: 9.1 mg/dL (ref 8.4–10.5)
Chloride: 107 meq/L (ref 96–112)
Creatinine, Ser: 0.73 mg/dL (ref 0.40–1.20)
GFR: 85.88 mL/min (ref >60.00)
Glucose, Bld: 148 mg/dL — ABNORMAL HIGH (ref 70–99)
Potassium: 3.8 meq/L (ref 3.5–5.1)
Sodium: 140 meq/L (ref 135–145)

## 2024-03-22 LAB — CBC WITH DIFFERENTIAL/PLATELET
Basophils Absolute: 0 10*3/uL (ref 0.0–0.1)
Basophils Relative: 0.7 % (ref 0.0–3.0)
Eosinophils Absolute: 0.1 10*3/uL (ref 0.0–0.7)
Eosinophils Relative: 2 % (ref 0.0–5.0)
HCT: 43.7 % (ref 36.0–46.0)
Hemoglobin: 14.6 g/dL (ref 12.0–15.0)
Lymphocytes Relative: 31.4 % (ref 12.0–46.0)
Lymphs Abs: 1.6 10*3/uL (ref 0.7–4.0)
MCHC: 33.4 g/dL (ref 30.0–36.0)
MCV: 91.9 fl (ref 78.0–100.0)
Monocytes Absolute: 0.3 10*3/uL (ref 0.1–1.0)
Monocytes Relative: 6 % (ref 3.0–12.0)
Neutro Abs: 3.1 10*3/uL (ref 1.4–7.7)
Neutrophils Relative %: 59.9 % (ref 43.0–77.0)
Platelets: 202 10*3/uL (ref 150.0–400.0)
RBC: 4.75 Mil/uL (ref 3.87–5.11)
RDW: 13.6 % (ref 11.5–15.5)
WBC: 5.2 10*3/uL (ref 4.0–10.5)

## 2024-03-22 LAB — LIPID PANEL
Cholesterol: 137 mg/dL (ref 0–200)
HDL: 55.7 mg/dL (ref >39.00)
LDL Cholesterol: 63 mg/dL (ref 0–99)
NonHDL: 81.79
Total CHOL/HDL Ratio: 2
Triglycerides: 96 mg/dL (ref 0.0–149.0)
VLDL: 19.2 mg/dL (ref 0.0–40.0)

## 2024-03-22 LAB — MICROALBUMIN / CREATININE URINE RATIO
Creatinine,U: 75.5 mg/dL
Microalb Creat Ratio: UNDETERMINED mg/g (ref 0.0–30.0)
Microalb, Ur: <0.7 mg/dL

## 2024-03-22 LAB — HEMOGLOBIN A1C: Hgb A1c MFr Bld: 6.4 % (ref 4.6–6.5)

## 2024-03-22 LAB — TSH: TSH: 0.51 u[IU]/mL (ref 0.35–5.50)

## 2024-03-22 MED ORDER — ALBUTEROL SULFATE HFA 108 (90 BASE) MCG/ACT IN AERS: 2.0000 | INHALATION_SPRAY | Freq: Four times a day (QID) | RESPIRATORY_TRACT | 2 refills | Status: AC | PRN

## 2024-03-22 MED ORDER — IRBESARTAN 300 MG PO TABS: 300.0000 mg | ORAL_TABLET | Freq: Every day | ORAL | 3 refills | Status: AC

## 2024-03-22 MED ORDER — GLIPIZIDE ER 10 MG PO TB24
10.0000 mg | ORAL_TABLET | Freq: Every day | ORAL | 3 refills | Status: AC
Start: 2024-03-22 — End: ?

## 2024-03-22 MED ORDER — OXYBUTYNIN CHLORIDE ER 10 MG PO TB24: ORAL_TABLET | ORAL | 3 refills | Status: AC

## 2024-03-22 MED ORDER — TOPIRAMATE 50 MG PO TABS: 50.0000 mg | ORAL_TABLET | Freq: Two times a day (BID) | ORAL | 1 refills | Status: DC

## 2024-03-22 MED ORDER — AMLODIPINE BESYLATE 10 MG PO TABS: 10.0000 mg | ORAL_TABLET | Freq: Every day | ORAL | 3 refills | Status: AC

## 2024-03-22 MED ORDER — CELECOXIB 200 MG PO CAPS: 200.0000 mg | ORAL_CAPSULE | Freq: Two times a day (BID) | ORAL | 1 refills | Status: DC | PRN

## 2024-03-22 MED ORDER — MONTELUKAST SODIUM 10 MG PO TABS: 10.0000 mg | ORAL_TABLET | Freq: Every day | ORAL | 3 refills | Status: AC

## 2024-03-22 NOTE — Patient Instructions (Signed)
Please continue all other medications as before, and refills have been done if requested.  Please have the pharmacy call with any other refills you may need.  Please continue your efforts at being more active, low cholesterol diet, and weight control.  You are otherwise up to date with prevention measures today.  Please keep your appointments with your specialists as you may have planned  Please go to the LAB at the blood drawing area for the tests to be done  You will be contacted by phone if any changes need to be made immediately.  Otherwise, you will receive a letter about your results with an explanation, but please check with MyChart first.  Please make an Appointment to return in 6 months, or sooner if needed, also with Lab Appointment for testing done 3-5 days before at the FIRST FLOOR Lab (so this is for TWO appointments - please see the scheduling desk as you leave)

## 2024-03-22 NOTE — Progress Notes (Signed)
 Patient ID: Tanya [name], female   DOB: June 05, 1956, 68 y.o.   MRN: [id number]         Chief Complaint:: wellness exam and Annual Exam (Did have one fall and hit her head ) In nov 2024 to left forehead with neg optho f/u after; dm, htn, low vit d       HPI:  Tanya [name] is a 68 y.o. female here for wellness exam; o/w up to date                        Also had a trip and fall in the home No 2024 with striking the head a partially glancing blow on the furniture before she go to the floor, without LOC, vision change or other new neuro symptoms, not on anticoagulant.  No apparent sequela but was advised after he eye exam per optho to let us  know when she got here today.     Pt denies chest pain, increased sob or doe, wheezing, orthopnea, PND, increased LE swelling, palpitations, dizziness or syncope.   Pt denies polydipsia, polyuria, or new focal neuro s/s.    Pt denies fever, wt loss, night sweats, loss of appetite, or other constitutional symptoms  Does have several wks ongoing nasal allergy  symptoms with clearish congestion, itch and sneezing, without fever, pain, ST, cough, swelling or wheezing.   Wt Readings from Last 3 Encounters:  03/22/24 246 lb (111.6 kg)  02/15/24 250 lb (113.4 kg)  12/16/23 258 lb (117 kg)   BP Readings from Last 3 Encounters:  03/22/24 122/80  12/16/23 124/68  09/22/23 138/82   Immunization History  Administered Date(s) Administered   Fluad Trivalent(High Dose 65+) 09/22/2023   H1N1 10/30/2008   Influenza Nasal 08/16/2016   Influenza Split 08/24/2019   Influenza Whole 08/24/1998, 07/25/2009, 08/24/2010, 08/24/2012   Influenza,inj,Quad PF,6+ Mos 10/02/2018, 08/22/2019, 08/10/2020, 09/18/2022   Influenza,inj,Quad PF,6-35 Mos 09/06/2015   Influenza,inj,quad, With Preservative 08/20/2016, 09/12/2017   Influenza-Unspecified 09/03/2013, 08/22/2016, 09/23/2017, 10/02/2018   PFIZER Comirnaty(Gray Top)Covid-19 Tri-Sucrose Vaccine 02/12/2020, 03/04/2020    PNEUMOCOCCAL CONJUGATE-20 03/23/2023   Pneumococcal Conjugate-13 02/08/2016   Pneumococcal Polysaccharide-23 01/11/2018   Td 11/25/1995, 11/24/2005   Tdap 02/08/2016   Zoster Recombinant(Shingrix) 04/06/2018, 10/09/2018   There are no preventive care reminders to display for this patient.     Past Medical History:  Diagnosis Date   Allergic rhinitis    Blepharospasm 10/09/2010   Cancer (HCC)    Skin   Diabetes mellitus without complication (HCC)    Hip pain    History of melanoma excision    MULTIPLE SITES   Hypertension    Macular pucker 04/2021   Left   Mild intermittent asthma    OA (osteoarthritis)    PMB (postmenopausal bleeding)    PONV (postoperative nausea and vomiting)    did not experience after D&C, request to use same medicaitons from that procedure   Precancerous changes of the cervix    Sleep apnea    tested in home around 2 yrs ago   Varicose vein    wears compression stocking   Past Surgical History:  Procedure Laterality Date   ABDOMINAL HYSTERECTOMY     CATARACT EXTRACTION Left 02/02/2024   COLONOSCOPY     DILATION AND CURETTAGE OF UTERUS     ESOPHAGOGASTRODUODENOSCOPY  10/14/2004   HAMMER TOE SURGERY     HYSTEROSCOPY WITH D & C N/A 04/07/2016   Procedure: DILATATION AND CURETTAGE /HYSTEROSCOPY WITH MYOSURE  POLYPECTOMY;  Surgeon: Cyd Dowse, MD;  Location: [facility];  Service: Gynecology;  Laterality: N/A;   JOINT REPLACEMENT     KNEE ARTHROSCOPY Bilateral left 08-01-2005/  right   LAPAROSCOPIC HYSTERECTOMY N/A 06/02/2016   Procedure: HYSTERECTOMY TOTAL LAPAROSCOPIC;  Surgeon: Cyd Dowse, MD;  Location: [facility];  Service: Gynecology;  Laterality: N/A;   LAPAROSCOPIC SALPINGO OOPHERECTOMY Bilateral 06/02/2016   Procedure: LAPAROSCOPIC SALPINGO OOPHORECTOMY;  Surgeon: Cyd Dowse, MD;  Location: [facility];  Service: Gynecology;  Laterality: Bilateral;   RE-DO ROTATOR CUFF REPAIR/  SAD/  ACROMIOPLASTY Right 01/04/2009    SHOULDER ARTHROSCOPY W/ ROTATOR CUFF REPAIR Right    tailor bunion removal     TONSILLECTOMY     TOTAL HIP ARTHROPLASTY Right 09/12/2016   Procedure: RIGHT TOTAL HIP ARTHROPLASTY ANTERIOR APPROACH;  Surgeon: Adonica Hoose, MD;  Location: [facility];  Service: Orthopedics;  Laterality: Right;   TOTAL HIP ARTHROPLASTY Right 10/01/2016   Procedure: IRRIGATION AND DEBRIDEMENT RIGHT HIP;  Surgeon: Adonica Hoose, MD;  Location: [facility];  Service: Orthopedics;  Laterality: Right;   TOTAL KNEE ARTHROPLASTY Left 12/29/2013   Procedure: LEFT TOTAL KNEE ARTHROPLASTY;  Surgeon: Loel Ring, MD;  Location: [facility];  Service: Orthopedics;  Laterality: Left;   TOTAL KNEE ARTHROPLASTY Right 05/15/2021   Procedure: TOTAL KNEE ARTHROPLASTY;  Surgeon: Orvan Blanch, MD;  Location: [facility];  Service: Orthopedics;  Laterality: Right;  2.5 hrs    reports that she quit smoking about 36 years ago. Her smoking use included cigarettes. She started smoking about 46 years ago. She has a 20 pack-year smoking history. She has never used smokeless tobacco. She reports current alcohol use of about 2.0 standard drinks of alcohol per week. She reports that she does not use drugs. family history includes Asthma in an other family member; Diabetes in an other family member; Esophageal cancer in her father; Melanoma in her sister; Ovarian cancer in an other family member. Allergies  Allergen Reactions   Ace Inhibitors Hives   Amoxicillin Hives   Other Hives    Nucafed   Penicillins Hives    Has patient had a PCN reaction causing immediate rash, facial/tongue/throat swelling, SOB or lightheadedness with hypotension: Yes Has patient had a PCN reaction causing severe rash involving mucus membranes or skin necrosis: No Has patient had a PCN reaction that required hospitalization No Has patient had a PCN reaction occurring within the last 10 years: No If all of the above answers are "NO", then may proceed with Cephalosporin use.     Propylene Glycol Itching    Itching of eyes/lashes   Metformin  And Related Other (See Comments)    GI upset   Wound Dressing Adhesive Other (See Comments)    Tore her skin   Wound Dressings Other (See Comments)    Tore her skin   Pseudoephedrine Hives   Pseudoephedrine-Codeine Hives   Current Outpatient Medications on File Prior to Visit  Medication Sig Dispense Refill   aspirin  81 MG tablet Take 1 tablet (81 mg total) by mouth daily. 60 tablet 1   calcium  acetate (PHOSLO ) 667 MG capsule TAKE 1 CAPSULE (667 MG TOTAL) BY MOUTH ONCE DAILY 90 capsule 3   methocarbamol  (ROBAXIN ) 500 MG tablet Take 1 tablet (500 mg total) by mouth every 6 (six) hours as needed for muscle spasms. 60 tablet 1   Multiple Vitamin (MULTIVITAMIN WITH MINERALS) TABS tablet Take 1 tablet by mouth daily.     omeprazole (PRILOSEC) 40 MG capsule Take  40 mg by mouth 2 (two) times daily.     phentermine  30 MG capsule Take 1 capsule (30 mg total) by mouth every morning. 90 capsule 1   Azelastine -Fluticasone  137-50 MCG/ACT SUSP Place 1 puff into the nose 2 (two) times daily. (Patient not taking: Reported on 03/22/2024) 1 g 5   No current facility-administered medications on file prior to visit.        ROS:  All others reviewed and negative.  Objective        PE:  BP 122/80 (BP Location: Right Arm, Patient Position: Sitting, Cuff Size: Normal)   Pulse (!) 107   Temp 98.1 F (36.7 C) (Oral)   Ht 5\' 7"  (1.702 m)   Wt 246 lb (111.6 kg)   LMP 09/13/2013   SpO2 100%   BMI 38.53 kg/m                 Constitutional: Pt appears in NAD               HENT: Head: NCAT.                Right Ear: External ear normal.                 Left Ear: External ear normal.                Eyes: . Pupils are equal, round, and reactive to light. Conjunctivae and EOM are normal               Nose: without d/c or deformity               Neck: Neck supple. Gross normal ROM               Cardiovascular: Normal rate and regular rhythm.                  Pulmonary/Chest: Effort normal and breath sounds without rales or wheezing.                Abd:  Soft, NT, ND, + BS, no organomegaly               Neurological: Pt is alert. At baseline orientation, motor grossly intact               Skin: Skin is warm. No rashes, no other new lesions, LE edema - none               Psychiatric: Pt behavior is normal without agitation   Micro: none  Cardiac tracings I have personally interpreted today:  none  Pertinent Radiological findings (summarize): none   Lab Results  Component Value Date   WBC 5.2 03/22/2024   HGB 14.6 03/22/2024   HCT 43.7 03/22/2024   PLT 202.0 03/22/2024   GLUCOSE 148 (H) 03/22/2024   CHOL 137 03/22/2024   TRIG 96.0 03/22/2024   HDL 55.70 03/22/2024   LDLCALC 63 03/22/2024   ALT 33 03/22/2024   AST 22 03/22/2024   NA 140 03/22/2024   K 3.8 03/22/2024   CL 107 03/22/2024   CREATININE 0.73 03/22/2024   BUN 12 03/22/2024   CO2 26 03/22/2024   TSH 0.51 03/22/2024   INR 1.0 04/04/2021   HGBA1C 6.4 03/22/2024   MICROALBUR <0.7 03/22/2024   Assessment/Plan:  Tanya [name] is a 68 y.o. White or Caucasian [1] female with  has a past medical history of Allergic rhinitis, Blepharospasm (10/09/2010), Cancer (HCC), Diabetes mellitus  without complication [facility]), Hip pain, History of melanoma excision, Hypertension, Macular pucker (04/2021), Mild intermittent asthma, OA (osteoarthritis), PMB (postmenopausal bleeding), PONV (postoperative nausea and vomiting), Precancerous changes of the cervix, Sleep apnea, and Varicose vein.  Encounter for well adult exam with abnormal findings Age and sex appropriate education and counseling updated with regular exercise and diet Referrals for preventative services - none needed Immunizations addressed - none needed Smoking counseling  - none needed Evidence for depression or other mood disorder - none significant Most recent labs reviewed. I have personally reviewed and have  noted: 1) the patient's medical and social history 2) The patient's current medications and supplements 3) The patient's height, weight, and BMI have been recorded in the chart   Hypertensive disorder BP Readings from Last 3 Encounters:  03/22/24 122/80  12/16/23 124/68  09/22/23 138/82   Stable, pt to continue medical treatment norvasc  10 every day, avapro  300 qd   Diabetes mellitus (HCC) Lab Results  Component Value Date   HGBA1C 6.4 03/22/2024   Stable, pt to continue current medical treatment gluotrol xl 10 qd   Vitamin D  deficiency Last vitamin D  Lab Results  Component Value Date   VD25OH 42.37 03/22/2024   Stable, cont oral replacement   Allergic rhinitis Mild to mod, for otc allegra and or nasacort asd,  to f/u any worsening symptoms or concerns  Followup: Return in about 6 months (around 09/21/2024).  Rosalia Colonel, MD 03/27/2024 9:39 AM [facility]

## 2024-03-22 NOTE — Progress Notes (Signed)
 The test results show that your current treatment is OK, as the tests are stable.  Please continue the same plan.  There is no other need for change of treatment or further evaluation based on these results, at this time.  thanks

## 2024-03-25 ENCOUNTER — Other Ambulatory Visit: Payer: Self-pay | Admitting: Internal Medicine

## 2024-03-26 ENCOUNTER — Other Ambulatory Visit: Payer: Self-pay | Admitting: Internal Medicine

## 2024-03-27 ENCOUNTER — Encounter: Payer: Self-pay | Admitting: Internal Medicine

## 2024-03-27 NOTE — Assessment & Plan Note (Signed)
 Mild to mod, for otc allegra and/or nasacort asd,  to f/u any worsening symptoms or concerns

## 2024-03-27 NOTE — Assessment & Plan Note (Signed)
 Last vitamin D  Lab Results  Component Value Date   VD25OH 42.37 03/22/2024   Stable, cont oral replacement

## 2024-03-27 NOTE — Assessment & Plan Note (Signed)

## 2024-03-27 NOTE — Assessment & Plan Note (Signed)
 Lab Results  Component Value Date   HGBA1C 6.4 03/22/2024   Stable, pt to continue current medical treatment gluotrol xl 10 qd

## 2024-03-27 NOTE — Assessment & Plan Note (Signed)
 BP Readings from Last 3 Encounters:  03/22/24 122/80  12/16/23 124/68  09/22/23 138/82   Stable, pt to continue medical treatment norvasc  10 every day, avapro  300 qd

## 2024-03-27 NOTE — Addendum Note (Signed)
 Addended by: Roslyn Coombe on: 03/27/2024 09:41 AM   Modules accepted: Orders

## 2024-03-28 ENCOUNTER — Other Ambulatory Visit: Payer: Self-pay

## 2024-04-05 DIAGNOSIS — H524 Presbyopia: Secondary | ICD-10-CM | POA: Diagnosis not present

## 2024-04-05 DIAGNOSIS — Z961 Presence of intraocular lens: Secondary | ICD-10-CM | POA: Diagnosis not present

## 2024-04-05 DIAGNOSIS — H5203 Hypermetropia, bilateral: Secondary | ICD-10-CM | POA: Diagnosis not present

## 2024-04-05 DIAGNOSIS — H52223 Regular astigmatism, bilateral: Secondary | ICD-10-CM | POA: Diagnosis not present

## 2024-04-05 DIAGNOSIS — H2511 Age-related nuclear cataract, right eye: Secondary | ICD-10-CM | POA: Diagnosis not present

## 2024-04-05 DIAGNOSIS — H59032 Cystoid macular edema following cataract surgery, left eye: Secondary | ICD-10-CM | POA: Diagnosis not present

## 2024-04-05 DIAGNOSIS — G4733 Obstructive sleep apnea (adult) (pediatric): Secondary | ICD-10-CM | POA: Diagnosis not present

## 2024-04-19 DIAGNOSIS — G4733 Obstructive sleep apnea (adult) (pediatric): Secondary | ICD-10-CM | POA: Diagnosis not present

## 2024-04-26 ENCOUNTER — Ambulatory Visit: Admitting: Adult Health

## 2024-04-26 DIAGNOSIS — D485 Neoplasm of uncertain behavior of skin: Secondary | ICD-10-CM | POA: Diagnosis not present

## 2024-05-10 DIAGNOSIS — H59032 Cystoid macular edema following cataract surgery, left eye: Secondary | ICD-10-CM | POA: Diagnosis not present

## 2024-05-20 DIAGNOSIS — G4733 Obstructive sleep apnea (adult) (pediatric): Secondary | ICD-10-CM | POA: Diagnosis not present

## 2024-06-01 DIAGNOSIS — G5131 Clonic hemifacial spasm, right: Secondary | ICD-10-CM | POA: Diagnosis not present

## 2024-06-19 DIAGNOSIS — G4733 Obstructive sleep apnea (adult) (pediatric): Secondary | ICD-10-CM | POA: Diagnosis not present

## 2024-06-21 DIAGNOSIS — L812 Freckles: Secondary | ICD-10-CM | POA: Diagnosis not present

## 2024-06-21 DIAGNOSIS — D3617 Benign neoplasm of peripheral nerves and autonomic nervous system of trunk, unspecified: Secondary | ICD-10-CM | POA: Diagnosis not present

## 2024-06-21 DIAGNOSIS — Z85820 Personal history of malignant melanoma of skin: Secondary | ICD-10-CM | POA: Diagnosis not present

## 2024-06-21 DIAGNOSIS — D485 Neoplasm of uncertain behavior of skin: Secondary | ICD-10-CM | POA: Diagnosis not present

## 2024-06-27 ENCOUNTER — Other Ambulatory Visit: Payer: Self-pay | Admitting: Internal Medicine

## 2024-07-06 DIAGNOSIS — G4733 Obstructive sleep apnea (adult) (pediatric): Secondary | ICD-10-CM | POA: Diagnosis not present

## 2024-07-11 ENCOUNTER — Ambulatory Visit (INDEPENDENT_AMBULATORY_CARE_PROVIDER_SITE_OTHER): Admitting: Adult Health

## 2024-07-11 ENCOUNTER — Encounter: Payer: Self-pay | Admitting: Adult Health

## 2024-07-11 VITALS — BP 118/77 | HR 94 | Temp 97.7°F | Ht 67.0 in | Wt 244.0 lb

## 2024-07-11 DIAGNOSIS — Z87891 Personal history of nicotine dependence: Secondary | ICD-10-CM | POA: Diagnosis not present

## 2024-07-11 DIAGNOSIS — K219 Gastro-esophageal reflux disease without esophagitis: Secondary | ICD-10-CM

## 2024-07-11 DIAGNOSIS — R918 Other nonspecific abnormal finding of lung field: Secondary | ICD-10-CM | POA: Diagnosis not present

## 2024-07-11 DIAGNOSIS — G4733 Obstructive sleep apnea (adult) (pediatric): Secondary | ICD-10-CM

## 2024-07-11 DIAGNOSIS — J4520 Mild intermittent asthma, uncomplicated: Secondary | ICD-10-CM

## 2024-07-11 DIAGNOSIS — J309 Allergic rhinitis, unspecified: Secondary | ICD-10-CM

## 2024-07-11 NOTE — Patient Instructions (Addendum)
 Continue on CPAP at bedtime Work on healthy weight Do not drive if sleepy  Dymista  1 puff Twice daily  As needed   Robitussin DM 2 tsp every 4hr for cough As needed   Chlor tabs 4mg  (Chlorpheniramine ) 2 At bedtime  As needed   Continue on Prilosec twice daily Albuterol  inhaler As needed   Xyzal daily.  Continue on Singulair  daily. Sips of water  to soothe throat, avoid throat clearing and cough.  NO MINTS   CT chest in April 2026  Follow-up in May 2026 with Dr. Jude  or Moe Brier NP and As needed   Please contact office for sooner follow up if symptoms do not improve or worsen or seek emergency care

## 2024-07-11 NOTE — Progress Notes (Signed)
 @Patient  ID: Tanya [name], female    DOB: 1956/07/14, 68 y.o.   MRN: [id number]  Chief Complaint  Patient presents with   Follow-up    CT F/U    Referring provider: Norleen Lynwood ORN, MD  HPI: 68 yo female former smoker followed for OSA, mild intermittent asthma and chronic cough , lung nodules   Cough work up  02/2023 High-resolution CT chest was negative for interstitial lung disease, scattered pulmonary nodules 7 mm LLL , BB atx  Allergen panel, normal IgE and absolute eosinophil count at 100.  03/2023 CT sinus was negative ENT referral -notes on May 08, 2023 reviewed with laryngoscopy showed mild post glottic edema consistent with LPR. PPI BID  Voice rest /stop singing. Speech center referral.  -no perceived benefit with Advair /Symbicort    TEST/EVENTS :  Spirometry 08/21/2022 shows no evidence of airway obstruction, ratio 80, FEV1 82% and FVC 78%    Chest x-ray August 21, 2022 clear lungs   CBC with differential April 2023, eosinophil absolute count 100  07/11/2024 Follow up : Asthma, Cough, OSA, Lung nodules Discussed the use of AI scribe software for clinical note transcription with the patient, who gave verbal consent to proceed.  History of Present Illness Tanya [name] is a 68 year old female who presents for a 14-month follow-up.  She is followed for mild intermittent asthma, allergic rhinitis, upper airway cough syndrome felt secondary to postnasal drainage and GERD.  And abnormal CT chest with scattered small pulmonary nodules followed on serial imaging.    Patient says since her last visit she is doing well.  Feels that her cough has totally resolved at this point.  She remains on Xyzal and Singulair  daily.  She also on Prilosec twice daily.   Patient has obstructive sleep apnea she continues to use her CPAP machine every night without any issues, using a nasal mask.  CPAP download shows excellent compliance with 100% usage.  Daily average usage at 8 hours.   She is on auto CPAP 5 to 15 cm H2O.  Daily average pressure at 10.9 cm H2O.  AHI 1.0/hour  She has known scattered pulmonary nodules on imaging.  Surveillance CT chest March 14, 2024 showed stable scattered bilateral pulmonary nodules. Incidental finding of a 2.2 cm right thyroid  nodule.   She plans to return to [place]  in late October and will stay through springtime. .     Allergies  Allergen Reactions   Ace Inhibitors Hives   Amoxicillin Hives   Other Hives    Nucafed   Penicillins Hives    Has patient had a PCN reaction causing immediate rash, facial/tongue/throat swelling, SOB or lightheadedness with hypotension: Yes Has patient had a PCN reaction causing severe rash involving mucus membranes or skin necrosis: No Has patient had a PCN reaction that required hospitalization No Has patient had a PCN reaction occurring within the last 10 years: No If all of the above answers are NO, then may proceed with Cephalosporin use.    Propylene Glycol Itching    Itching of eyes/lashes   Metformin  And Related Other (See Comments)    GI upset   Wound Dressing Adhesive Other (See Comments)    Tore her skin   Wound Dressings Other (See Comments)    Tore her skin   Pseudoephedrine Hives   Pseudoephedrine-Codeine Hives    Immunization History  Administered Date(s) Administered   Fluad Trivalent(High Dose 65+) 09/22/2023   H1N1 10/30/2008   Influenza Nasal  08/16/2016   Influenza Split 08/24/2019   Influenza Whole 08/24/1998, 07/25/2009, 08/24/2010, 08/24/2012   Influenza,inj,Quad PF,6+ Mos 10/02/2018, 08/22/2019, 08/10/2020, 09/18/2022   Influenza,inj,Quad PF,6-35 Mos 09/06/2015   Influenza,inj,quad, With Preservative 08/20/2016, 09/12/2017   Influenza-Unspecified 09/03/2013, 08/22/2016, 09/23/2017, 10/02/2018   PFIZER Comirnaty(Gray Top)Covid-19 Tri-Sucrose Vaccine 02/12/2020, 03/04/2020   PNEUMOCOCCAL CONJUGATE-20 03/23/2023   Pneumococcal Conjugate-13 02/08/2016    Pneumococcal Polysaccharide-23 01/11/2018   Td 11/25/1995, 11/24/2005   Tdap 02/08/2016   Zoster Recombinant(Shingrix) 04/06/2018, 10/09/2018    Past Medical History:  Diagnosis Date   Allergic rhinitis    Blepharospasm 10/09/2010   Cancer (HCC)    Skin   Diabetes mellitus without complication (HCC)    Hip pain    History of melanoma excision    MULTIPLE SITES   Hypertension    Macular pucker 04/2021   Left   Mild intermittent asthma    OA (osteoarthritis)    PMB (postmenopausal bleeding)    PONV (postoperative nausea and vomiting)    did not experience after D&C, request to use same medicaitons from that procedure   Precancerous changes of the cervix    Sleep apnea    tested in home around 2 yrs ago   Varicose vein    wears compression stocking    Tobacco History: Social History   Tobacco Use  Smoking Status Former   Current packs/day: 0.00   Average packs/day: 2.0 packs/day for 10.0 years (20.0 ttl pk-yrs)   Types: Cigarettes   Start date: 11/24/1977   Quit date: 11/25/1987   Years since quitting: 36.6  Smokeless Tobacco Never   Counseling given: Not Answered   Outpatient Medications Prior to Visit  Medication Sig Dispense Refill   albuterol  (VENTOLIN  HFA) 108 (90 Base) MCG/ACT inhaler Inhale 2 puffs into the lungs every 6 (six) hours as needed for wheezing or shortness of breath. 18 g 2   amLODipine  (NORVASC ) 10 MG tablet Take 1 tablet (10 mg total) by mouth daily. 90 tablet 3   aspirin  81 MG tablet Take 1 tablet (81 mg total) by mouth daily. 60 tablet 1   calcium  acetate (PHOSLO ) 667 MG capsule TAKE 1 CAPSULE (667 MG TOTAL) BY MOUTH ONCE DAILY 90 capsule 3   celecoxib  (CELEBREX ) 200 MG capsule Take 1 capsule (200 mg total) by mouth 2 (two) times daily as needed for moderate pain (pain score 4-6). 180 capsule 1   glipiZIDE  (GLUCOTROL  XL) 10 MG 24 hr tablet Take 1 tablet (10 mg total) by mouth daily with breakfast. 90 tablet 3   irbesartan  (AVAPRO ) 300 MG tablet  Take 1 tablet (300 mg total) by mouth daily. 90 tablet 3   methocarbamol  (ROBAXIN ) 500 MG tablet Take 1 tablet (500 mg total) by mouth every 6 (six) hours as needed for muscle spasms. 60 tablet 1   montelukast  (SINGULAIR ) 10 MG tablet Take 1 tablet (10 mg total) by mouth daily. 90 tablet 3   Multiple Vitamin (MULTIVITAMIN WITH MINERALS) TABS tablet Take 1 tablet by mouth daily.     omeprazole (PRILOSEC) 40 MG capsule Take 40 mg by mouth 2 (two) times daily.     oxybutynin  (DITROPAN -XL) 10 MG 24 hr tablet oxybutynin  chloride ER 10 mg tablet,extended release 24 hr 90 tablet 3   phentermine  30 MG capsule Take 1 capsule (30 mg total) by mouth every morning. 90 capsule 1   topiramate  (TOPAMAX ) 50 MG tablet Take 1 tablet (50 mg total) by mouth 2 (two) times daily. 180 tablet 1   Azelastine -Fluticasone   137-50 MCG/ACT SUSP Place 1 puff into the nose 2 (two) times daily. (Patient not taking: Reported on 07/11/2024) 1 g 5   No facility-administered medications prior to visit.     Review of Systems:   Constitutional:   No  weight loss, night sweats,  Fevers, chills, fatigue, or  lassitude.  HEENT:   No headaches,  Difficulty swallowing,  Tooth/dental problems, or  Sore throat,                No sneezing, itching, ear ache, nasal congestion, post nasal drip,   CV:  No chest pain,  Orthopnea, PND, swelling in lower extremities, anasarca, dizziness, palpitations, syncope.   GI  No heartburn, indigestion, abdominal pain, nausea, vomiting, diarrhea, change in bowel habits, loss of appetite, bloody stools.   Resp: No shortness of breath with exertion or at rest.  No excess mucus, no productive cough,  No non-productive cough,  No coughing up of blood.  No change in color of mucus.  No wheezing.  No chest wall deformity  Skin: no rash or lesions.  GU: no dysuria, change in color of urine, no urgency or frequency.  No flank pain, no hematuria   MS:  No joint pain or swelling.  No decreased range of  motion.  No back pain.    Physical Exam  BP 118/77   Pulse 94   Temp 97.7 F (36.5 C) (Oral)   Ht 5' 7 (1.702 m)   Wt 244 lb (110.7 kg)   LMP 09/13/2013   SpO2 96%   BMI 38.22 kg/m   GEN: A/Ox3; pleasant , NAD, well nourished    HEENT:  [place]/AT,    NOSE-clear, THROAT-clear, no lesions, no postnasal drip or exudate noted.   NECK:  Supple w/ fair ROM; no JVD; normal carotid impulses w/o bruits; no thyromegaly or nodules palpated; no lymphadenopathy.    RESP  Clear  P & A; w/o, wheezes/ rales/ or rhonchi. no accessory muscle use, no dullness to percussion  CARD:  RRR, no m/r/g, no peripheral edema, pulses intact, no cyanosis or clubbing.  GI:   Soft & nt; nml bowel sounds; no organomegaly or masses detected.   Musco: Warm bil, no deformities or joint swelling noted.   Neuro: alert, no focal deficits noted.    Skin: Warm, no lesions or rashes    Lab Results:  CBC    BNP No results found for: BNP  ProBNP No results found for: PROBNP  Imaging: No results found.  Administration History     None           No data to display          No results found for: NITRICOXIDE      Assessment & Plan:   No problem-specific Assessment & Plan notes found for this encounter. Assessment and Plan Assessment & Plan Obstructive sleep apnea   Obstructive sleep apnea is well-managed with CPAP therapy. She uses a nasal mask and complies with nightly use. Continue CPAP therapy nightly, advise using distilled water  for CPAP, and maintain mask hygiene.  Patient has perceived benefit.  No changes in pressure settings.  Stable pulmonary nodules   Pulmonary nodules remain stable with no change since the last CT scan in April. Nodules are small  Order CT chest in April 2026 and schedule a follow-up visit after the CT scan in May 2026.  Allergic rhinitis   The previous cough has resolved.  Continue current allergy  management and use allergy   medications as  needed.  Asthma   Continue on Singulair  daily.  Control for triggers.  Albuterol  as needed.   Gastroesophageal reflux disease   Gastroesophageal reflux disease is managed with Prilosec. She is taking Prilosec twice daily. Continue Prilosec twice daily.  Thyroid  nodule continue follow-up with primary care.  Previous ultrasound of thyroid  in May 2024  Plan    Patient Instructions  Continue on CPAP at bedtime Work on healthy weight Do not drive if sleepy  Dymista  1 puff Twice daily  As needed   Robitussin DM 2 tsp every 4hr for cough As needed   Chlor tabs 4mg  (Chlorpheniramine ) 2 At bedtime  As needed   Continue on Prilosec twice daily Albuterol  inhaler As needed   Xyzal daily.  Continue on Singulair  daily. Sips of water  to soothe throat, avoid throat clearing and cough.  NO MINTS   CT chest in April 2026  Follow-up in May 2026 with Dr. Jude  or Lilyanna Lunt NP and As needed   Please contact office for sooner follow up if symptoms do not improve or worsen or seek emergency care  n   Madelin Stank, NP 07/11/2024

## 2024-07-20 DIAGNOSIS — G4733 Obstructive sleep apnea (adult) (pediatric): Secondary | ICD-10-CM | POA: Diagnosis not present

## 2024-08-24 ENCOUNTER — Other Ambulatory Visit: Payer: Self-pay | Admitting: Internal Medicine

## 2024-08-30 DIAGNOSIS — G5131 Clonic hemifacial spasm, right: Secondary | ICD-10-CM | POA: Diagnosis not present

## 2024-09-14 ENCOUNTER — Ambulatory Visit: Admitting: Internal Medicine

## 2024-09-14 VITALS — BP 130/82 | HR 68 | Temp 98.2°F | Ht 67.0 in | Wt 240.2 lb

## 2024-09-14 DIAGNOSIS — Z23 Encounter for immunization: Secondary | ICD-10-CM

## 2024-09-14 DIAGNOSIS — E559 Vitamin D deficiency, unspecified: Secondary | ICD-10-CM

## 2024-09-14 DIAGNOSIS — Z7984 Long term (current) use of oral hypoglycemic drugs: Secondary | ICD-10-CM | POA: Diagnosis not present

## 2024-09-14 DIAGNOSIS — I10 Essential (primary) hypertension: Secondary | ICD-10-CM | POA: Diagnosis not present

## 2024-09-14 DIAGNOSIS — E1165 Type 2 diabetes mellitus with hyperglycemia: Secondary | ICD-10-CM

## 2024-09-14 DIAGNOSIS — E66813 Obesity, class 3: Secondary | ICD-10-CM

## 2024-09-14 DIAGNOSIS — Z6841 Body Mass Index (BMI) 40.0 and over, adult: Secondary | ICD-10-CM

## 2024-09-14 MED ORDER — PHENTERMINE HCL 30 MG PO CAPS: 30.0000 mg | ORAL_CAPSULE | Freq: Every day | ORAL | 1 refills | Status: DC

## 2024-09-14 NOTE — Progress Notes (Unsigned)
 Patient ID: Tanya [name], female   DOB: 1956-02-19, 68 y.o.   MRN: [id number]        Chief Complaint: follow up HTN, HLD and DM, obesity       HPI:  Tanya [name] is a 68 y.o. female here overall doing ok, Pt denies chest pain, increased sob or doe, wheezing, orthopnea, PND, increased LE swelling, palpitations, dizziness or syncope.   Pt denies polydipsia, polyuria, or new focal neuro s/s.    Pt denies fever, wt loss, night sweats, loss of appetite, or other constitutional symptoms  Due for flu shot       Wt Readings from Last 3 Encounters:  09/14/24 240 lb 4 oz (109 kg)  07/11/24 244 lb (110.7 kg)  03/22/24 246 lb (111.6 kg)   BP Readings from Last 3 Encounters:  09/14/24 130/82  07/11/24 118/77  03/22/24 122/80         Past Medical History:  Diagnosis Date   Allergic rhinitis    Blepharospasm 10/09/2010   Cancer (HCC)    Skin   Diabetes mellitus without complication (HCC)    Hip pain    History of melanoma excision    MULTIPLE SITES   Hypertension    Macular pucker 04/2021   Left   Mild intermittent asthma    OA (osteoarthritis)    PMB (postmenopausal bleeding)    PONV (postoperative nausea and vomiting)    did not experience after D&C, request to use same medicaitons from that procedure   Precancerous changes of the cervix    Sleep apnea    tested in home around 2 yrs ago   Varicose vein    wears compression stocking   Past Surgical History:  Procedure Laterality Date   ABDOMINAL HYSTERECTOMY     CATARACT EXTRACTION Left 02/02/2024   COLONOSCOPY     DILATION AND CURETTAGE OF UTERUS     ESOPHAGOGASTRODUODENOSCOPY  10/14/2004   HAMMER TOE SURGERY     HYSTEROSCOPY WITH D & C N/A 04/07/2016   Procedure: DILATATION AND CURETTAGE /HYSTEROSCOPY WITH MYOSURE POLYPECTOMY;  Surgeon: Krystal Deaner, MD;  Location: [facility];  Service: Gynecology;  Laterality: N/A;   JOINT REPLACEMENT     KNEE ARTHROSCOPY Bilateral left 08-01-2005/  right    LAPAROSCOPIC HYSTERECTOMY N/A 06/02/2016   Procedure: HYSTERECTOMY TOTAL LAPAROSCOPIC;  Surgeon: Krystal Deaner, MD;  Location: [facility];  Service: Gynecology;  Laterality: N/A;   LAPAROSCOPIC SALPINGO OOPHERECTOMY Bilateral 06/02/2016   Procedure: LAPAROSCOPIC SALPINGO OOPHORECTOMY;  Surgeon: Krystal Deaner, MD;  Location: [facility];  Service: Gynecology;  Laterality: Bilateral;   RE-DO ROTATOR CUFF REPAIR/  SAD/  ACROMIOPLASTY Right 01/04/2009   SHOULDER ARTHROSCOPY W/ ROTATOR CUFF REPAIR Right    tailor bunion removal     TONSILLECTOMY     TOTAL HIP ARTHROPLASTY Right 09/12/2016   Procedure: RIGHT TOTAL HIP ARTHROPLASTY ANTERIOR APPROACH;  Surgeon: Redell Shoals, MD;  Location: [facility];  Service: Orthopedics;  Laterality: Right;   TOTAL HIP ARTHROPLASTY Right 10/01/2016   Procedure: IRRIGATION AND DEBRIDEMENT RIGHT HIP;  Surgeon: Redell Shoals, MD;  Location: [facility];  Service: Orthopedics;  Laterality: Right;   TOTAL KNEE ARTHROPLASTY Left 12/29/2013   Procedure: LEFT TOTAL KNEE ARTHROPLASTY;  Surgeon: Reyes JAYSON Billing, MD;  Location: [facility];  Service: Orthopedics;  Laterality: Left;   TOTAL KNEE ARTHROPLASTY Right 05/15/2021   Procedure: TOTAL KNEE ARTHROPLASTY;  Surgeon: Billing Reyes, MD;  Location: [facility];  Service: Orthopedics;  Laterality: Right;  2.5  hrs    reports that she quit smoking about 36 years ago. Her smoking use included cigarettes. She started smoking about 46 years ago. She has a 20 pack-year smoking history. She has never used smokeless tobacco. She reports current alcohol use of about 2.0 standard drinks of alcohol per week. She reports that she does not use drugs. family history includes Asthma in an other family member; Diabetes in an other family member; Esophageal cancer in her father; Melanoma in her sister; Ovarian cancer in an other family member. Allergies  Allergen Reactions   Ace Inhibitors Hives   Amoxicillin Hives   Other Hives    Nucafed   Penicillins Hives     Has patient had a PCN reaction causing immediate rash, facial/tongue/throat swelling, SOB or lightheadedness with hypotension: Yes Has patient had a PCN reaction causing severe rash involving mucus membranes or skin necrosis: No Has patient had a PCN reaction that required hospitalization No Has patient had a PCN reaction occurring within the last 10 years: No If all of the above answers are NO, then may proceed with Cephalosporin use.    Propylene Glycol Itching    Itching of eyes/lashes   Metformin  And Related Other (See Comments)    GI upset   Wound Dressing Adhesive Other (See Comments)    Tore her skin   Wound Dressings Other (See Comments)    Tore her skin   Pseudoephedrine Hives   Pseudoephedrine-Codeine Hives   Current Outpatient Medications on File Prior to Visit  Medication Sig Dispense Refill   albuterol  (VENTOLIN  HFA) 108 (90 Base) MCG/ACT inhaler Inhale 2 puffs into the lungs every 6 (six) hours as needed for wheezing or shortness of breath. 18 g 2   amLODipine  (NORVASC ) 10 MG tablet Take 1 tablet (10 mg total) by mouth daily. 90 tablet 3   aspirin  81 MG tablet Take 1 tablet (81 mg total) by mouth daily. 60 tablet 1   calcium  acetate (PHOSLO ) 667 MG capsule TAKE 1 CAPSULE (667 MG TOTAL) BY MOUTH ONCE DAILY 90 capsule 3   celecoxib  (CELEBREX ) 200 MG capsule Take 1 capsule (200 mg total) by mouth 2 (two) times daily as needed for moderate pain (pain score 4-6). 180 capsule 1   glipiZIDE  (GLUCOTROL  XL) 10 MG 24 hr tablet Take 1 tablet (10 mg total) by mouth daily with breakfast. 90 tablet 3   irbesartan  (AVAPRO ) 300 MG tablet Take 1 tablet (300 mg total) by mouth daily. 90 tablet 3   methocarbamol  (ROBAXIN ) 500 MG tablet Take 1 tablet (500 mg total) by mouth every 6 (six) hours as needed for muscle spasms. 60 tablet 1   montelukast  (SINGULAIR ) 10 MG tablet Take 1 tablet (10 mg total) by mouth daily. 90 tablet 3   Multiple Vitamin (MULTIVITAMIN WITH MINERALS) TABS tablet Take 1  tablet by mouth daily.     omeprazole (PRILOSEC) 40 MG capsule Take 40 mg by mouth 2 (two) times daily.     oxybutynin  (DITROPAN -XL) 10 MG 24 hr tablet oxybutynin  chloride ER 10 mg tablet,extended release 24 hr 90 tablet 3   topiramate  (TOPAMAX ) 50 MG tablet Take 1 tablet (50 mg total) by mouth 2 (two) times daily. 180 tablet 1   Azelastine -Fluticasone  137-50 MCG/ACT SUSP Place 1 puff into the nose 2 (two) times daily. (Patient not taking: Reported on 09/14/2024) 1 g 5   No current facility-administered medications on file prior to visit.        ROS:  All others reviewed  and negative.  Objective        PE:  BP 130/82   Pulse 68   Temp 98.2 F (36.8 C) (Temporal)   Ht 5' 7 (1.702 m)   Wt 240 lb 4 oz (109 kg)   LMP 09/13/2013   SpO2 98%   BMI 37.63 kg/m                 Constitutional: Pt appears in NAD               HENT: Head: NCAT.                Right Ear: External ear normal.                 Left Ear: External ear normal.                Eyes: . Pupils are equal, round, and reactive to light. Conjunctivae and EOM are normal               Nose: without d/c or deformity               Neck: Neck supple. Gross normal ROM               Cardiovascular: Normal rate and regular rhythm.                 Pulmonary/Chest: Effort normal and breath sounds without rales or wheezing.                Abd:  Soft, NT, ND, + BS, no organomegaly               Neurological: Pt is alert. At baseline orientation, motor grossly intact               Skin: Skin is warm. No rashes, no other new lesions, LE edema - none               Psychiatric: Pt behavior is normal without agitation   Micro: none  Cardiac tracings I have personally interpreted today:  none  Pertinent Radiological findings (summarize): none   Lab Results  Component Value Date   WBC 5.2 03/22/2024   HGB 14.6 03/22/2024   HCT 43.7 03/22/2024   PLT 202.0 03/22/2024   GLUCOSE 148 (H) 03/22/2024   CHOL 137 03/22/2024   TRIG 96.0  03/22/2024   HDL 55.70 03/22/2024   LDLCALC 63 03/22/2024   ALT 33 03/22/2024   AST 22 03/22/2024   NA 140 03/22/2024   K 3.8 03/22/2024   CL 107 03/22/2024   CREATININE 0.73 03/22/2024   BUN 12 03/22/2024   CO2 26 03/22/2024   TSH 0.51 03/22/2024   INR 1.0 04/04/2021   HGBA1C 6.4 03/22/2024   MICROALBUR <0.7 03/22/2024   Assessment/Plan:  Tanya [name] is a 68 y.o. White or Caucasian [1] female with  has a past medical history of Allergic rhinitis, Blepharospasm (10/09/2010), Cancer (HCC), Diabetes mellitus without complication (HCC), Hip pain, History of melanoma excision, Hypertension, Macular pucker (04/2021), Mild intermittent asthma, OA (osteoarthritis), PMB (postmenopausal bleeding), PONV (postoperative nausea and vomiting), Precancerous changes of the cervix, Sleep apnea, and Varicose vein.  Diabetes mellitus with hyperglycemia (HCC) With hyperglycemia, no long term use of insulin   Lab Results  Component Value Date   HGBA1C 6.4 03/22/2024   Stable, pt to continue current medical treatment glucotrol  xl 10 mg qd     Hypertensive disorder BP Readings from Last  3 Encounters:  09/14/24 130/82  07/11/24 118/77  03/22/24 122/80   Stable, pt to continue medical treatment norvasc  10 mg every day, avapro  300 mg qd   Obesity Wt Readings from Last 3 Encounters:  09/14/24 240 lb 4 oz (109 kg)  07/11/24 244 lb (110.7 kg)  03/22/24 246 lb (111.6 kg)   Slowly losing wt, tolerates phentermine , will refill per reqeust  Vitamin D  deficiency Last vitamin D  Lab Results  Component Value Date   VD25OH 42.37 03/22/2024   Stable, cont oral replacement  Followup: Return in about 6 months (around 03/15/2025).  Lynwood Rush, MD 09/19/2024 4:18 PM [facility]

## 2024-09-14 NOTE — Assessment & Plan Note (Signed)
 With hyperglycemia, no long term use of insulin   Lab Results  Component Value Date   HGBA1C 6.4 03/22/2024   Stable, pt to continue current medical treatment glucotrol  xl 10 mg qd

## 2024-09-14 NOTE — Patient Instructions (Signed)
 You had the flu shot today  Please continue all other medications as before, including the phentermine   Please have the pharmacy call with any other refills you may need.  Please continue your efforts at being more active, low cholesterol diet, and weight control.  Please keep your appointments with your specialists as you may have planned  Please make an Appointment to return in 6 months, or sooner if needed, also with Lab Appointment for testing done 3-5 days before at the FIRST FLOOR Lab (so this is for TWO appointments - please see the scheduling desk as you leave)

## 2024-09-16 ENCOUNTER — Encounter: Payer: Self-pay | Admitting: Internal Medicine

## 2024-09-16 NOTE — Assessment & Plan Note (Signed)
 BP Readings from Last 3 Encounters:  09/14/24 130/82  07/11/24 118/77  03/22/24 122/80   Stable, pt to continue medical treatment norvasc  10 mg every day, avapro  300 mg qd

## 2024-09-16 NOTE — Assessment & Plan Note (Signed)
 Wt Readings from Last 3 Encounters:  09/14/24 240 lb 4 oz (109 kg)  07/11/24 244 lb (110.7 kg)  03/22/24 246 lb (111.6 kg)   Slowly losing wt, tolerates phentermine , will refill per reqeust

## 2024-09-16 NOTE — Assessment & Plan Note (Signed)
 Last vitamin D  Lab Results  Component Value Date   VD25OH 42.37 03/22/2024   Stable, cont oral replacement

## 2024-09-21 ENCOUNTER — Ambulatory Visit: Admitting: Internal Medicine

## 2024-09-22 ENCOUNTER — Other Ambulatory Visit: Payer: Self-pay

## 2024-09-22 ENCOUNTER — Other Ambulatory Visit: Payer: Self-pay | Admitting: Internal Medicine

## 2024-09-29 ENCOUNTER — Other Ambulatory Visit: Payer: Self-pay

## 2024-09-29 ENCOUNTER — Other Ambulatory Visit: Payer: Self-pay | Admitting: Internal Medicine

## 2024-10-04 ENCOUNTER — Telehealth: Payer: Self-pay

## 2024-10-04 NOTE — Telephone Encounter (Signed)
 Sending as FYI  Copied from CRM [id number]. Topic: Appointments - Transfer of Care >> Oct 04, 2024  4:36 PM Rea ORN wrote: Pt is requesting to transfer FROM: Dr. Norleen Pt is requesting to transfer TO: Dr. Candise Reason for requested transfer: PCP retiring It is the responsibility of the team the patient would like to transfer to (Dr. Candise) to reach out to the patient if for any reason this transfer is not acceptable.

## 2024-10-05 MED ORDER — PHENTERMINE HCL 30 MG PO CAPS: 30.0000 mg | ORAL_CAPSULE | Freq: Every day | ORAL | 1 refills | Status: AC

## 2024-10-05 NOTE — Addendum Note (Signed)
 Addended by: NORLEEN LYNWOOD ORN on: 10/05/2024 11:54 AM   Modules accepted: Orders

## 2025-02-01 ENCOUNTER — Ambulatory Visit

## 2025-02-15 ENCOUNTER — Ambulatory Visit

## 2025-03-03 ENCOUNTER — Encounter: Admitting: Internal Medicine

## 2025-03-06 ENCOUNTER — Encounter: Admitting: Family Medicine

## 2025-03-09 ENCOUNTER — Ambulatory Visit

## 2025-04-03 ENCOUNTER — Ambulatory Visit: Admitting: Internal Medicine

## 2025-04-04 ENCOUNTER — Encounter: Admitting: Internal Medicine

## 2025-04-05 ENCOUNTER — Encounter: Admitting: Family Medicine
